# Patient Record
Sex: Female | Born: 1990
Health system: Southern US, Community
[De-identification: ages and names within clinical notes are randomized; demographics above are authoritative.]

## PROBLEM LIST (undated history)

## (undated) DIAGNOSIS — Z3201 Encounter for pregnancy test, result positive: Secondary | ICD-10-CM

## (undated) DIAGNOSIS — N946 Dysmenorrhea, unspecified: Secondary | ICD-10-CM

## (undated) DIAGNOSIS — N760 Acute vaginitis: Secondary | ICD-10-CM

## (undated) DIAGNOSIS — Z8619 Personal history of other infectious and parasitic diseases: Secondary | ICD-10-CM

## (undated) DIAGNOSIS — L309 Dermatitis, unspecified: Secondary | ICD-10-CM

## (undated) DIAGNOSIS — I10 Essential (primary) hypertension: Secondary | ICD-10-CM

## (undated) DIAGNOSIS — B9689 Other specified bacterial agents as the cause of diseases classified elsewhere: Secondary | ICD-10-CM

## (undated) DIAGNOSIS — B009 Herpesviral infection, unspecified: Secondary | ICD-10-CM

## (undated) DIAGNOSIS — R87629 Unspecified abnormal cytological findings in specimens from vagina: Secondary | ICD-10-CM

## (undated) DIAGNOSIS — N83209 Unspecified ovarian cyst, unspecified side: Secondary | ICD-10-CM

## (undated) DIAGNOSIS — T7412XA Child physical abuse, confirmed, initial encounter: Secondary | ICD-10-CM

## (undated) DIAGNOSIS — A630 Anogenital (venereal) warts: Secondary | ICD-10-CM

## (undated) HISTORY — DX: Unspecified ovarian cyst, unspecified side: N83.209

## (undated) HISTORY — DX: Child physical abuse, confirmed, initial encounter: T74.12XA

## (undated) HISTORY — PX: WISDOM TOOTH EXTRACTION: SHX21

## (undated) HISTORY — DX: Unspecified abnormal cytological findings in specimens from vagina: R87.629

## (undated) HISTORY — DX: Essential (primary) hypertension: I10

## (undated) HISTORY — DX: Other specified bacterial agents as the cause of diseases classified elsewhere: B96.89

## (undated) HISTORY — DX: Other specified bacterial agents as the cause of diseases classified elsewhere: N76.0

## (undated) HISTORY — PX: BREAST BIOPSY: SHX20

## (undated) HISTORY — PX: NO PAST SURGERIES: SHX2092

## (undated) HISTORY — DX: Anogenital (venereal) warts: A63.0

## (undated) HISTORY — DX: Personal history of other infectious and parasitic diseases: Z86.19

## (undated) HISTORY — DX: Dermatitis, unspecified: L30.9

---
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
## 2009-03-25 ENCOUNTER — Emergency Department (HOSPITAL_COMMUNITY): Admission: EM | Admit: 2009-03-25 | Discharge: 2009-03-25 | Payer: Self-pay | Admitting: Emergency Medicine

## 2009-07-27 ENCOUNTER — Emergency Department (HOSPITAL_COMMUNITY): Admission: EM | Admit: 2009-07-27 | Discharge: 2009-07-27 | Payer: Self-pay | Admitting: Emergency Medicine

## 2009-08-21 ENCOUNTER — Emergency Department (HOSPITAL_COMMUNITY): Admission: EM | Admit: 2009-08-21 | Discharge: 2009-08-22 | Payer: Self-pay | Admitting: Emergency Medicine

## 2009-10-12 ENCOUNTER — Emergency Department (HOSPITAL_COMMUNITY): Admission: EM | Admit: 2009-10-12 | Discharge: 2009-10-12 | Payer: Self-pay | Admitting: Emergency Medicine

## 2009-12-29 ENCOUNTER — Emergency Department (HOSPITAL_COMMUNITY): Admission: EM | Admit: 2009-12-29 | Discharge: 2009-12-29 | Payer: Self-pay | Admitting: Emergency Medicine

## 2010-07-29 LAB — URINALYSIS, ROUTINE W REFLEX MICROSCOPIC
Bilirubin Urine: NEGATIVE
Glucose, UA: NEGATIVE mg/dL
Hgb urine dipstick: NEGATIVE
Protein, ur: NEGATIVE mg/dL
Urobilinogen, UA: 0.2 mg/dL (ref 0.0–1.0)
pH: 6 (ref 5.0–8.0)

## 2010-07-29 LAB — GC/CHLAMYDIA PROBE AMP, GENITAL: GC Probe Amp, Genital: POSITIVE — AB

## 2010-07-29 LAB — WET PREP, GENITAL: Yeast Wet Prep HPF POC: NONE SEEN

## 2010-07-29 LAB — PREGNANCY, URINE: Preg Test, Ur: NEGATIVE

## 2010-08-02 LAB — URINALYSIS, ROUTINE W REFLEX MICROSCOPIC
Hgb urine dipstick: NEGATIVE
Specific Gravity, Urine: 1.025 (ref 1.005–1.030)
pH: 6 (ref 5.0–8.0)

## 2010-08-02 LAB — URINE MICROSCOPIC-ADD ON

## 2010-08-02 LAB — POCT PREGNANCY, URINE: Preg Test, Ur: NEGATIVE

## 2010-08-06 LAB — CBC
HCT: 38 % (ref 36.0–46.0)
Hemoglobin: 13.1 g/dL (ref 12.0–15.0)
MCHC: 34.6 g/dL (ref 30.0–36.0)
MCV: 87.2 fL (ref 78.0–100.0)
Platelets: 284 10*3/uL (ref 150–400)
RBC: 4.36 MIL/uL (ref 3.87–5.11)
RDW: 13.3 % (ref 11.5–15.5)
WBC: 6.1 10*3/uL (ref 4.0–10.5)

## 2010-08-06 LAB — DIFFERENTIAL
Basophils Absolute: 0 10*3/uL (ref 0.0–0.1)
Basophils Relative: 1 % (ref 0–1)

## 2010-08-06 LAB — URINALYSIS, ROUTINE W REFLEX MICROSCOPIC
Ketones, ur: NEGATIVE mg/dL
Nitrite: NEGATIVE
Protein, ur: NEGATIVE mg/dL

## 2010-08-06 LAB — HEPATIC FUNCTION PANEL
ALT: 11 U/L (ref 0–35)
Albumin: 3.7 g/dL (ref 3.5–5.2)
Alkaline Phosphatase: 65 U/L (ref 39–117)
Total Bilirubin: 0.7 mg/dL (ref 0.3–1.2)

## 2010-08-06 LAB — BASIC METABOLIC PANEL
BUN: 8 mg/dL (ref 6–23)
CO2: 25 mEq/L (ref 19–32)
Calcium: 9.2 mg/dL (ref 8.4–10.5)
GFR calc Af Amer: >60 mL/min (ref >60)
Sodium: 138 mEq/L (ref 135–145)

## 2010-08-06 LAB — PREGNANCY, URINE: Preg Test, Ur: NEGATIVE

## 2010-08-18 LAB — HEPATIC FUNCTION PANEL
Alkaline Phosphatase: 78 U/L (ref 39–117)
Bilirubin, Direct: 0.3 mg/dL (ref 0.0–0.3)
Indirect Bilirubin: 0.6 mg/dL (ref 0.3–0.9)
Total Bilirubin: 0.9 mg/dL (ref 0.3–1.2)

## 2010-08-18 LAB — BASIC METABOLIC PANEL
BUN: 11 mg/dL (ref 6–23)
CO2: 25 mEq/L (ref 19–32)
Calcium: 10.2 mg/dL (ref 8.4–10.5)
Chloride: 104 mEq/L (ref 96–112)
GFR calc Af Amer: >60 mL/min (ref >60)
GFR calc non Af Amer: >60 mL/min (ref >60)
Glucose, Bld: 124 mg/dL — ABNORMAL HIGH (ref 70–99)

## 2010-08-18 LAB — DIFFERENTIAL
Basophils Relative: 0 % (ref 0–1)
Eosinophils Relative: 0 % (ref 0–5)
Monocytes Absolute: 0.6 10*3/uL (ref 0.1–1.0)
Monocytes Relative: 3 % (ref 3–12)
Neutro Abs: 18.3 10*3/uL — ABNORMAL HIGH (ref 1.7–7.7)

## 2010-08-18 LAB — PREGNANCY, URINE: Preg Test, Ur: NEGATIVE

## 2010-08-18 LAB — URINALYSIS, ROUTINE W REFLEX MICROSCOPIC
Nitrite: NEGATIVE
Specific Gravity, Urine: 1.015 (ref 1.005–1.030)
Urobilinogen, UA: 1 mg/dL (ref 0.0–1.0)
pH: 7 (ref 5.0–8.0)

## 2010-08-18 LAB — LIPASE, BLOOD: Lipase: 12 U/L (ref 11–59)

## 2010-08-18 LAB — URINE MICROSCOPIC-ADD ON

## 2010-08-18 LAB — GC/CHLAMYDIA PROBE AMP, GENITAL: GC Probe Amp, Genital: POSITIVE — AB

## 2010-08-18 LAB — CBC
Hemoglobin: 14.3 g/dL (ref 12.0–15.0)
RBC: 4.77 MIL/uL (ref 3.87–5.11)
RDW: 13.2 % (ref 11.5–15.5)

## 2010-08-21 LAB — URINALYSIS, ROUTINE W REFLEX MICROSCOPIC
Ketones, ur: NEGATIVE mg/dL
Nitrite: NEGATIVE
Specific Gravity, Urine: 1.025 (ref 1.005–1.030)
pH: 7.5 (ref 5.0–8.0)

## 2010-08-21 LAB — COMPREHENSIVE METABOLIC PANEL
ALT: 17 U/L (ref 0–35)
Albumin: 4.1 g/dL (ref 3.5–5.2)
Alkaline Phosphatase: 62 U/L (ref 39–117)
Chloride: 111 mEq/L (ref 96–112)
Glucose, Bld: 95 mg/dL (ref 70–99)
Potassium: 3.8 mEq/L (ref 3.5–5.1)
Sodium: 144 mEq/L (ref 135–145)
Total Protein: 6.8 g/dL (ref 6.0–8.3)

## 2010-08-21 LAB — CBC
Hemoglobin: 13.4 g/dL (ref 12.0–15.0)
RBC: 4.31 MIL/uL (ref 3.87–5.11)
RDW: 12.6 % (ref 11.5–15.5)
WBC: 7.4 10*3/uL (ref 4.0–10.5)

## 2010-08-21 LAB — DIFFERENTIAL
Basophils Relative: 0 % (ref 0–1)
Eosinophils Absolute: 0 10*3/uL (ref 0.0–0.7)
Monocytes Absolute: 0.2 10*3/uL (ref 0.1–1.0)
Monocytes Relative: 2 % — ABNORMAL LOW (ref 3–12)
Neutrophils Relative %: 78 % — ABNORMAL HIGH (ref 43–77)

## 2010-08-21 LAB — PREGNANCY, URINE: Preg Test, Ur: NEGATIVE

## 2010-08-25 LAB — WET PREP, GENITAL: Trich, Wet Prep: NONE SEEN

## 2010-08-30 LAB — DIFFERENTIAL
Basophils Absolute: 0 10*3/uL (ref 0.0–0.1)
Lymphocytes Relative: 53 % — ABNORMAL HIGH (ref 12–46)
Monocytes Absolute: 0.5 10*3/uL (ref 0.1–1.0)
Monocytes Relative: 6 % (ref 3–12)
Neutro Abs: 3 10*3/uL (ref 1.7–7.7)

## 2010-08-30 LAB — BASIC METABOLIC PANEL
Calcium: 9.7 mg/dL (ref 8.4–10.5)
GFR calc Af Amer: >60 mL/min (ref >60)
GFR calc non Af Amer: >60 mL/min (ref >60)
Sodium: 137 mEq/L (ref 135–145)

## 2010-08-30 LAB — CBC
Hemoglobin: 13.6 g/dL (ref 12.0–15.0)
RBC: 4.64 MIL/uL (ref 3.87–5.11)

## 2010-09-24 ENCOUNTER — Emergency Department (HOSPITAL_COMMUNITY)
Admission: EM | Admit: 2010-09-24 | Discharge: 2010-09-24 | Disposition: A | Discharge status: Home / Self Care | Payer: Medicaid Other | Attending: Emergency Medicine | Admitting: Emergency Medicine

## 2010-09-24 ENCOUNTER — Emergency Department (HOSPITAL_COMMUNITY): Payer: Medicaid Other

## 2010-09-24 DIAGNOSIS — N898 Other specified noninflammatory disorders of vagina: Secondary | ICD-10-CM | POA: Insufficient documentation

## 2010-09-24 LAB — URINALYSIS, ROUTINE W REFLEX MICROSCOPIC
Bilirubin Urine: NEGATIVE
Ketones, ur: NEGATIVE mg/dL
Nitrite: NEGATIVE
Specific Gravity, Urine: >1.03 — ABNORMAL HIGH (ref 1.005–1.030)
Urobilinogen, UA: 0.2 mg/dL (ref 0.0–1.0)

## 2010-09-24 LAB — DIFFERENTIAL
Basophils Absolute: 0 10*3/uL (ref 0.0–0.1)
Lymphocytes Relative: 34 % (ref 12–46)
Monocytes Absolute: 0.4 10*3/uL (ref 0.1–1.0)
Monocytes Relative: 8 % (ref 3–12)
Neutro Abs: 2.8 10*3/uL (ref 1.7–7.7)

## 2010-09-24 LAB — CBC
HCT: 39.1 % (ref 36.0–46.0)
Hemoglobin: 12.8 g/dL (ref 12.0–15.0)
MCHC: 32.7 g/dL (ref 30.0–36.0)
RBC: 4.51 MIL/uL (ref 3.87–5.11)

## 2010-09-24 LAB — PREGNANCY, URINE: Preg Test, Ur: NEGATIVE

## 2010-09-24 LAB — WET PREP, GENITAL
Clue Cells Wet Prep HPF POC: NONE SEEN
Trich, Wet Prep: NONE SEEN

## 2010-09-24 LAB — URINE MICROSCOPIC-ADD ON

## 2010-09-27 LAB — GC/CHLAMYDIA PROBE AMP, GENITAL: Chlamydia, DNA Probe: NEGATIVE

## 2011-02-14 LAB — COMPREHENSIVE METABOLIC PANEL
ALT: 18
AST: 31
Alkaline Phosphatase: 68
CO2: 23
Calcium: 10.6 — ABNORMAL HIGH
Chloride: 108
GFR calc Af Amer: >60
GFR calc non Af Amer: >60
Glucose, Bld: 110 — ABNORMAL HIGH
Potassium: 4.1
Sodium: 142

## 2011-02-14 LAB — CBC
Hemoglobin: 15.5 — ABNORMAL HIGH
MCHC: 33.6
RBC: 5.23 — ABNORMAL HIGH
WBC: 12.6 — ABNORMAL HIGH

## 2011-02-14 LAB — PREGNANCY, URINE: Preg Test, Ur: NEGATIVE

## 2011-02-14 LAB — DIFFERENTIAL
Basophils Relative: 0
Eosinophils Absolute: 0
Eosinophils Relative: 0
Lymphs Abs: 1.6

## 2011-02-14 LAB — LIPASE, BLOOD: Lipase: 15

## 2011-02-14 LAB — SAMPLE TO BLOOD BANK

## 2011-02-14 LAB — AMYLASE: Amylase: 112

## 2011-02-28 LAB — URINALYSIS, ROUTINE W REFLEX MICROSCOPIC
Leukocytes, UA: NEGATIVE
Nitrite: NEGATIVE
Protein, ur: 30 — AB
pH: 6

## 2011-02-28 LAB — STREP A DNA PROBE: Group A Strep Probe: NEGATIVE

## 2011-02-28 LAB — PREGNANCY, URINE: Preg Test, Ur: NEGATIVE

## 2011-05-17 NOTE — L&D Delivery Note (Signed)
Delivery Note At 3:47 AM a viable female was delivered via Vaginal, Kiwi Vacuum (Extractor) (Presentation: Right Occiput Anterior).  APGAR: 4, 8; weight pending.   Placenta status: Intact, Spontaneous.  Cord: 3 vessels with the following complications: None.  Cord pH: pending Dr. Shawnie Pons present for delivery and delivered infant with vacuum extractor, as above. Placenta delivered by Dr. Casper Harrison.  Anesthesia: Epidural  Episiotomy: None Lacerations: right labial Suture Repair: 3.0 Vicryl Est. Blood Loss (mL): 300  Mom to postpartum.  Baby to nursery-stable.  [name], Rebecca [name], 4:06 AM  I was called to the room with terminal bradycardia after rapid descent.  There was some minimal bleeding noted.  Head was noted to be ROA and +4.  Good maternal effort but FHR in the 90's x 10 mins on my arrival. I applied vacuum and pulled x 1 with rapid pop-off.  Re-applied vacuum 2 contractions later when FHR was improved but very smooth line.  Pulled x 2 and delivery of the head accomplished.  Nuchal cord x 1 reduced easily.  Shoulders delivered easily.  Infant stunned at birth and cord clamped x 2 and cut and given to waiting Peds.  There was clot noted on placenta. [name],[name] [name] 4:15 AM

## 2011-07-05 ENCOUNTER — Emergency Department (HOSPITAL_COMMUNITY)
Admission: EM | Admit: 2011-07-05 | Discharge: 2011-07-05 | Disposition: A | Discharge status: Home / Self Care | Payer: Medicaid Other | Attending: Emergency Medicine | Admitting: Emergency Medicine

## 2011-07-05 ENCOUNTER — Encounter (HOSPITAL_COMMUNITY): Payer: Self-pay | Admitting: *Deleted

## 2011-07-05 DIAGNOSIS — K529 Noninfective gastroenteritis and colitis, unspecified: Secondary | ICD-10-CM

## 2011-07-05 DIAGNOSIS — G43909 Migraine, unspecified, not intractable, without status migrainosus: Secondary | ICD-10-CM | POA: Insufficient documentation

## 2011-07-05 DIAGNOSIS — K5289 Other specified noninfective gastroenteritis and colitis: Secondary | ICD-10-CM | POA: Insufficient documentation

## 2011-07-05 DIAGNOSIS — R000 Tachycardia, unspecified: Secondary | ICD-10-CM | POA: Insufficient documentation

## 2011-07-05 LAB — COMPREHENSIVE METABOLIC PANEL
ALT: 16 U/L (ref 0–35)
Alkaline Phosphatase: 85 U/L (ref 39–117)
CO2: 23 mEq/L (ref 19–32)
Calcium: 10.6 mg/dL — ABNORMAL HIGH (ref 8.4–10.5)
Chloride: 106 mEq/L (ref 96–112)
GFR calc Af Amer: >90 mL/min (ref >90)
GFR calc non Af Amer: >90 mL/min (ref >90)
Glucose, Bld: 120 mg/dL — ABNORMAL HIGH (ref 70–99)
Potassium: 4.9 mEq/L (ref 3.5–5.1)
Sodium: 140 mEq/L (ref 135–145)
Total Bilirubin: 0.5 mg/dL (ref 0.3–1.2)

## 2011-07-05 LAB — CBC
MCV: 86.2 fL (ref 78.0–100.0)
Platelets: 331 10*3/uL (ref 150–400)
RBC: 5.08 MIL/uL (ref 3.87–5.11)
WBC: 13.8 10*3/uL — ABNORMAL HIGH (ref 4.0–10.5)

## 2011-07-05 LAB — DIFFERENTIAL
Eosinophils Relative: 0 % (ref 0–5)
Lymphocytes Relative: 6 % — ABNORMAL LOW (ref 12–46)
Lymphs Abs: 0.8 10*3/uL (ref 0.7–4.0)
Neutro Abs: 12.3 10*3/uL — ABNORMAL HIGH (ref 1.7–7.7)

## 2011-07-05 LAB — URINALYSIS, ROUTINE W REFLEX MICROSCOPIC
Bilirubin Urine: NEGATIVE
Glucose, UA: NEGATIVE mg/dL
Hgb urine dipstick: NEGATIVE
Protein, ur: NEGATIVE mg/dL
Urobilinogen, UA: 0.2 mg/dL (ref 0.0–1.0)

## 2011-07-05 MED ORDER — SODIUM CHLORIDE 0.9 % IV BOLUS (SEPSIS)
1000.0000 mL | Freq: Once | INTRAVENOUS | Status: AC
  Administered 2011-07-05: 1000 mL via INTRAVENOUS

## 2011-07-05 MED ORDER — ONDANSETRON HCL 4 MG PO TABS: 4.0000 mg | ORAL_TABLET | Freq: Four times a day (QID) | ORAL | Status: DC

## 2011-07-05 MED ORDER — ONDANSETRON HCL 4 MG/2ML IJ SOLN
4.0000 mg | Freq: Once | INTRAMUSCULAR | Status: AC
  Administered 2011-07-05: 4 mg via INTRAVENOUS
  Filled 2011-07-05: qty 2

## 2011-07-05 MED ORDER — ONDANSETRON HCL 4 MG PO TABS: 4.0000 mg | ORAL_TABLET | Freq: Four times a day (QID) | ORAL | Status: AC

## 2011-07-05 MED ORDER — PANTOPRAZOLE SODIUM 40 MG IV SOLR
40.0000 mg | Freq: Once | INTRAVENOUS | Status: AC
  Administered 2011-07-05: 40 mg via INTRAVENOUS
  Filled 2011-07-05: qty 40

## 2011-07-05 NOTE — ED Notes (Signed)
Pt states is feeling better. Pt is alert, and talking. No emesis at this time.

## 2011-07-05 NOTE — ED Notes (Signed)
Vomiting, diarrhea, since 5 am.

## 2011-07-05 NOTE — ED Notes (Signed)
Pt states had a migraine last night, and awoke this morning with emesis and diarrhea. Pt is vomiting large amounts of yellow bile, pt denies pain at this time. PIV started without difficulty

## 2011-07-05 NOTE — ED Provider Notes (Signed)
History   Scribed for Loren Racer, MD, the patient was seen in room APA11/APA11 . This chart was scribed by Lewanda Rife.   CSN: [id number]  Arrival date & time 07/05/11  1534   First MD Initiated Contact with Patient 07/05/11 1544      Chief Complaint  Patient presents with  . Emesis    (Consider location/radiation/quality/duration/timing/severity/associated sxs/prior treatment) HPI Comments: Pt states episodes of vomiting and diarrhea suddenly initiated after eating salmon cakes at 5 am this morning. Pt reports she has not been in contact with anyone ill recently.   Patient is a 21 y.o. female presenting with vomiting. The history is provided by the patient.  Emesis  This is a new problem. The current episode started 12 to 24 hours ago. The problem occurs continuously. The problem has been gradually worsening. The emesis has an appearance of stomach contents. There has been no fever. Associated symptoms include abdominal pain (epigastric) and diarrhea. Pertinent negatives include no cough, no fever and no headaches. Risk factors include suspect food intake.    Past Medical History  Diagnosis Date  . Migraine     History reviewed. No pertinent past surgical history.  Family History  Problem Relation Age of Onset  . Diabetes Mother   . Diabetes Father     History  Substance Use Topics  . Smoking status: Never Smoker   . Smokeless tobacco: Not on file  . Alcohol Use: No    OB History    Grav Para Term Preterm Abortions TAB SAB Ect Mult Living                  Review of Systems  Constitutional: Negative for fever and fatigue.  HENT: Negative for congestion, sinus pressure and ear discharge.   Eyes: Negative for discharge.  Respiratory: Negative for cough.   Cardiovascular: Negative for chest pain.  Gastrointestinal: Positive for nausea, vomiting, abdominal pain (epigastric) and diarrhea.  Genitourinary: Negative for frequency and hematuria.    Musculoskeletal: Negative for back pain.  Skin: Negative for rash.  Neurological: Negative for seizures and headaches.  Hematological: Negative.   Psychiatric/Behavioral: Negative for hallucinations.  All other systems reviewed and are negative.    Allergies  Benadryl and Nyquil  Home Medications   Current Outpatient Rx  Name Route Sig Dispense Refill  . IBUPROFEN 200 MG PO TABS Oral Take 400 mg by mouth as needed. For migraines    . ONDANSETRON HCL 4 MG PO TABS Oral Take 1 tablet (4 mg total) by mouth every 6 (six) hours. 12 tablet 0    BP 123/100  Pulse 90  Temp(Src) 98 F (36.7 C) (Oral)  Resp 20  Ht 5\' 3"  (1.6 m)  Wt 118 lb (53.524 kg)  BMI 20.90 kg/m2  SpO2 100%  LMP 06/17/2011  Physical Exam  Nursing note and vitals reviewed. Constitutional: She is oriented to person, place, and time. She appears well-developed.  HENT:  Head: Normocephalic and atraumatic.  Mouth/Throat: Oropharynx is clear and moist.  Eyes: Conjunctivae and EOM are normal. No scleral icterus.  Neck: Neck supple. No thyromegaly present.  Cardiovascular: Regular rhythm.  Tachycardia present.  Exam reveals no gallop and no friction rub.   No murmur heard. Pulmonary/Chest: Effort normal. No stridor. She has no wheezes. She has no rales. She exhibits no tenderness.  Abdominal: Soft. She exhibits no distension. There is tenderness (Mild epigastric tenderness  ). There is no rebound and no guarding.  Musculoskeletal: Normal range of motion.  She exhibits no edema.  Lymphadenopathy:    She has no cervical adenopathy.  Neurological: She is oriented to person, place, and time. Coordination normal.  Skin: Skin is warm and dry. No rash noted. No erythema.  Psychiatric: She has a normal mood and affect. Her behavior is normal.    ED Course  Procedures (including critical care time)  Labs Reviewed  CBC - Abnormal; Notable for the following:    WBC 13.8 (*)    All other components within normal limits   DIFFERENTIAL - Abnormal; Notable for the following:    Neutrophils Relative 89 (*)    Neutro Abs 12.3 (*)    Lymphocytes Relative 6 (*)    All other components within normal limits  COMPREHENSIVE METABOLIC PANEL - Abnormal; Notable for the following:    Glucose, Bld 120 (*)    Calcium 10.6 (*)    All other components within normal limits  URINALYSIS, ROUTINE W REFLEX MICROSCOPIC - Abnormal; Notable for the following:    APPearance HAZY (*)    Specific Gravity, Urine >1.030 (*)    Ketones, ur TRACE (*)    All other components within normal limits  LIPASE, BLOOD  PREGNANCY, URINE   No results found.   1. Gastroenteritis       MDM    Pt states she is feeling better. No vomiting in ED.     I personally performed the services described in this documentation, which was scribed in my presence. The recorded information has been reviewed and considered.     Loren Racer, MD 07/05/11 [phone]

## 2011-07-05 NOTE — Discharge Instructions (Signed)
B.R.A.T. Diet Your doctor has recommended the B.R.A.T. diet for you or your child until the condition improves. This is often used to help control diarrhea and vomiting symptoms. If you or your child can tolerate clear liquids, you may have:  Bananas.   Rice.   Applesauce.   Toast (and other simple starches such as crackers, potatoes, noodles).  Be sure to avoid dairy products, meats, and fatty foods until symptoms are better. Fruit juices such as apple, grape, and prune juice can make diarrhea worse. Avoid these. Continue this diet for 2 days or as instructed by your caregiver. Document Released: 05/02/2005 Document Revised: 01/12/2011 Document Reviewed: 10/19/2006 Exit[employer] Patient Information 2012 [employer].  Viral Gastroenteritis Viral gastroenteritis is also known as stomach flu. This condition affects the stomach and intestinal tract. The illness typically lasts 3 to 8 days. Most people develop an immune response. This eventually gets rid of the virus. While this natural response develops, the virus can make you quite ill.  CAUSES  Diarrhea and vomiting are often caused by a virus. Medicines (antibiotics) that kill germs will not help unless there is also a germ (bacterial) infection. SYMPTOMS  The most common symptom is diarrhea. This can cause severe loss of fluids (dehydration) and body salt (electrolyte) imbalance. TREATMENT  Treatments for this illness are aimed at rehydration. Antidiarrheal medicines are not recommended. They do not decrease diarrhea volume and may be harmful. Usually, home treatment is all that is needed. The most serious cases involve vomiting so severely that you are not able to keep down fluids taken by mouth (orally). In these cases, intravenous (IV) fluids are needed. Vomiting with viral gastroenteritis is common, but it will usually go away with treatment. HOME CARE INSTRUCTIONS  Small amounts of fluids should be taken frequently. Large amounts at one  time may not be tolerated. Plain water may be harmful in infants and the elderly. Oral rehydration solutions (ORS) are available at pharmacies and grocery stores. ORS replace water and important electrolytes in proper proportions. Sports drinks are not as effective as ORS and may be harmful due to sugars worsening diarrhea.  As a general guideline for children, replace any new fluid losses from diarrhea or vomiting with ORS as follows:   If your child weighs 22 pounds or under (10 kg or less), give 60-120 mL (1/4 - 1/2 cup or 2 - 4 ounces) of ORS for each diarrheal stool or vomiting episode.   If your child weighs more than 22 pounds (more than 10 kgs), give 120-240 mL (1/2 - 1 cup or 4 - 8 ounces) of ORS for each diarrheal stool or vomiting episode.   In a child with vomiting, it may be helpful to give the above ORS replacement in 5 mL (1 teaspoon) amounts every 5 minutes, then increase as tolerated.   While correcting for dehydration, children should eat normally. However, foods high in sugar should be avoided because this may worsen diarrhea. Large amounts of carbonated soft drinks, juice, gelatin desserts, and other highly sugared drinks should be avoided.   After correction of dehydration, other liquids that are appealing to the child may be added. Children should drink small amounts of fluids frequently and fluids should be increased as tolerated.   Adults should eat normally while drinking more fluids than usual. Drink small amounts of fluids frequently and increase as tolerated. Drink enough water and fluids to keep your urine clear or pale yellow. Broths, weak decaffeinated tea, lemon-lime soft drinks (allowed to   go flat), and ORS replace fluids and electrolytes.   Avoid:   Carbonated drinks.   Juice.   Extremely hot or cold fluids.   Caffeine drinks.   Fatty, greasy foods.   Alcohol.   Tobacco.   Too much intake of anything at one time.   Gelatin desserts.   Probiotics  are active cultures of beneficial bacteria. They may lessen the amount and number of diarrheal stools in adults. Probiotics can be found in yogurt with active cultures and in supplements.   Wash your hands well to avoid spreading bacteria and viruses.   Antidiarrheal medicines are not recommended for infants and children.   Only take over-the-counter or prescription medicines for pain, discomfort, or fever as directed by your caregiver. Do not give aspirin to children.   For adults with dehydration, ask your caregiver if you should continue all prescribed and over-the-counter medicines.   If your caregiver has given you a follow-up appointment, it is very important to keep that appointment. Not keeping the appointment could result in a lasting (chronic) or permanent injury and disability. If there is any problem keeping the appointment, you must call to reschedule.  SEEK IMMEDIATE MEDICAL CARE IF:   You are unable to keep fluids down.   There is no urine output in 6 to 8 hours or there is only a small amount of very dark urine.   You develop shortness of breath.   There is blood in the vomit (may look like coffee grounds) or stool.   Belly (abdominal) pain develops, increases, or localizes.   There is persistent vomiting or diarrhea.   You have a fever.   Your baby is older than 3 months with a rectal temperature of 102 F (38.9 C) or higher.   Your baby is 3 months old or younger with a rectal temperature of 100.4 F (38 C) or higher.  MAKE SURE YOU:   Understand these instructions.   Will watch your condition.   Will get help right away if you are not doing well or get worse.  Document Released: 05/02/2005 Document Revised: 01/12/2011 Document Reviewed: 09/13/2006 [employer] Patient Information 2012 [employer]. 

## 2011-09-05 ENCOUNTER — Encounter (HOSPITAL_COMMUNITY): Payer: Self-pay | Admitting: *Deleted

## 2011-09-05 ENCOUNTER — Emergency Department (HOSPITAL_COMMUNITY)
Admission: EM | Admit: 2011-09-05 | Discharge: 2011-09-05 | Disposition: A | Discharge status: Home / Self Care | Payer: Medicaid Other | Attending: Emergency Medicine | Admitting: Emergency Medicine

## 2011-09-05 DIAGNOSIS — O210 Mild hyperemesis gravidarum: Secondary | ICD-10-CM | POA: Insufficient documentation

## 2011-09-05 DIAGNOSIS — E876 Hypokalemia: Secondary | ICD-10-CM | POA: Insufficient documentation

## 2011-09-05 DIAGNOSIS — R42 Dizziness and giddiness: Secondary | ICD-10-CM | POA: Insufficient documentation

## 2011-09-05 DIAGNOSIS — O99891 Other specified diseases and conditions complicating pregnancy: Secondary | ICD-10-CM | POA: Insufficient documentation

## 2011-09-05 LAB — URINALYSIS, ROUTINE W REFLEX MICROSCOPIC
Bilirubin Urine: NEGATIVE
Glucose, UA: NEGATIVE mg/dL
Ketones, ur: >80 mg/dL — AB
Leukocytes, UA: NEGATIVE
Specific Gravity, Urine: 1.02 (ref 1.005–1.030)
pH: 8.5 — ABNORMAL HIGH (ref 5.0–8.0)

## 2011-09-05 LAB — BASIC METABOLIC PANEL
BUN: 8 mg/dL (ref 6–23)
Chloride: 99 mEq/L (ref 96–112)
GFR calc Af Amer: >90 mL/min (ref >90)
GFR calc non Af Amer: >90 mL/min (ref >90)
Glucose, Bld: 87 mg/dL (ref 70–99)
Potassium: 3.4 mEq/L — ABNORMAL LOW (ref 3.5–5.1)
Sodium: 135 mEq/L (ref 135–145)

## 2011-09-05 MED ORDER — ONDANSETRON HCL 4 MG/2ML IJ SOLN
4.0000 mg | Freq: Once | INTRAMUSCULAR | Status: AC
  Administered 2011-09-05: 4 mg via INTRAVENOUS
  Filled 2011-09-05: qty 2

## 2011-09-05 MED ORDER — ONDANSETRON 8 MG PO TBDP: 8.0000 mg | ORAL_TABLET | Freq: Three times a day (TID) | ORAL | Status: AC | PRN

## 2011-09-05 MED ORDER — SODIUM CHLORIDE 0.9 % IV BOLUS (SEPSIS)
1000.0000 mL | Freq: Once | INTRAVENOUS | Status: AC
  Administered 2011-09-05: 1000 mL via INTRAVENOUS

## 2011-09-05 NOTE — ED Notes (Addendum)
N/v, abd pain, headache, Positive preg test at home.

## 2011-09-05 NOTE — ED Provider Notes (Signed)
History     CSN: [id number]  Arrival date & time 09/05/11  1242   First MD Initiated Contact with Patient 09/05/11 1535      Chief Complaint  Patient presents with  . Emesis    (Consider location/radiation/quality/duration/timing/severity/associated sxs/prior treatment) Patient is a 21 y.o. female presenting with vomiting. The history is provided by the patient.  Emesis  Pertinent negatives include no abdominal pain, no diarrhea and no headaches.   patient has had nausea vomiting for last 3 weeks. She states she's got a headache because of it. No relief with her Zofran at home. He states it starts in the morning date. She states she took a home pregnancy test and it was positive. Her last period was the end of February beginning of March. She did not have her normal period beginning of a. Mild abdominal pain. Mild dizziness. No fevers. She states that throwing up food and bile. She has an appointment with Dr. Emelda Fear tomorrow  Past Medical History  Diagnosis Date  . Migraine     History reviewed. No pertinent past surgical history.  Family History  Problem Relation Age of Onset  . Diabetes Mother     History  Substance Use Topics  . Smoking status: Former Games developer  . Smokeless tobacco: Not on file  . Alcohol Use: No    OB History    Grav Para Term Preterm Abortions TAB SAB Ect Mult Living                  Review of Systems  Constitutional: Negative for activity change and appetite change.  HENT: Negative for neck stiffness.   Eyes: Negative for pain.  Respiratory: Negative for chest tightness and shortness of breath.   Cardiovascular: Negative for chest pain and leg swelling.  Gastrointestinal: Positive for nausea and vomiting. Negative for abdominal pain and diarrhea.  Genitourinary: Negative for flank pain and vaginal bleeding.  Musculoskeletal: Negative for back pain.  Skin: Negative for rash.  Neurological: Positive for dizziness. Negative for weakness,  numbness and headaches.  Psychiatric/Behavioral: Negative for behavioral problems.    Allergies  Benadryl and Nyquil  Home Medications   Current Outpatient Rx  Name Route Sig Dispense Refill  . IBUPROFEN 200 MG PO TABS Oral Take 600-800 mg by mouth as needed. For migraines    . ONDANSETRON HCL 4 MG PO TABS Oral Take 4 mg by mouth once as needed. For nausea and vomiting    . ONDANSETRON 8 MG PO TBDP Oral Take 1 tablet (8 mg total) by mouth every 8 (eight) hours as needed for nausea. 20 tablet 0    BP 109/56  Pulse 74  Temp(Src) 97.8 F (36.6 C) (Oral)  Resp 18  Ht 5\' 3"  (1.6 m)  Wt 106 lb (48.081 kg)  BMI 18.78 kg/m2  SpO2 100%  LMP 07/14/2011  Physical Exam  Nursing note and vitals reviewed. Constitutional: She is oriented to person, place, and time. She appears well-developed and well-nourished.  HENT:  Head: Normocephalic and atraumatic.  Eyes: EOM are normal. Pupils are equal, round, and reactive to light.  Neck: Normal range of motion. Neck supple.  Cardiovascular: Normal rate, regular rhythm and normal heart sounds.   No murmur heard. Pulmonary/Chest: Effort normal and breath sounds normal. No respiratory distress. She has no wheezes. She has no rales.  Abdominal: Soft. Bowel sounds are normal. She exhibits no distension. There is no tenderness. There is no rebound and no guarding.  Musculoskeletal: Normal range of  motion.  Neurological: She is alert and oriented to person, place, and time. No cranial nerve deficit.  Skin: Skin is warm and dry.  Psychiatric: She has a normal mood and affect. Her speech is normal.    ED Course  Procedures (including critical care time)  Labs Reviewed  URINALYSIS, ROUTINE W REFLEX MICROSCOPIC - Abnormal; Notable for the following:    Color, Urine AMBER (*) BIOCHEMICALS MAY BE AFFECTED BY COLOR   APPearance CLOUDY (*)    pH 8.5 (*)    Ketones, ur >80 (*)    All other components within normal limits  PREGNANCY, URINE - Abnormal;  Notable for the following:    Preg Test, Ur POSITIVE (*)    All other components within normal limits  BASIC METABOLIC PANEL - Abnormal; Notable for the following:    Potassium 3.4 (*)    All other components within normal limits   No results found.   1. Hyperemesis gravidarum       MDM  Patient with positive pregnancy test and nausea vomiting. Last period was beginning of last month. She has mild hypokalemia here. She has greater than 80 ketones the urine. Patient feels much better after 2 L of IV fluids. She is tolerated orals. She has followup with an OB/GYN in the morning.        Juliet Rude. Rubin Payor, MD 09/05/11 [phone]

## 2011-09-05 NOTE — Discharge Instructions (Signed)
Hyperemesis Gravidarum  Hyperemesis gravidarum is a severe form of nausea and vomiting that happens during pregnancy. Hyperemesis is worse than morning sickness. It may cause a woman to have nausea or vomiting all day for many days. It may keep a woman from eating and drinking enough food and liquids. Hyperemesis usually occurs during the first half (the first 20 weeks) of pregnancy. It often goes away once a woman is in her second half of pregnancy. However, sometimes hyperemesis continues through an entire pregnancy.   CAUSES   The cause of this condition is not completely known but is thought to be due to changes in the body's hormones when pregnant. It could be the high level of the pregnancy hormone or an increase in estrogen in the body.   SYMPTOMS    Severe nausea and vomiting.   Nausea that does not go away.   Vomiting that does not allow you to keep any food down.   Weight loss and body fluid loss (dehydration).   Having no desire to eat or not liking food you have previously enjoyed.  DIAGNOSIS   Your caregiver may ask you about your symptoms. Your caregiver may also order blood tests and urine tests to make sure something else is not causing the problem.   TREATMENT   You may only need medicine to control the problem. If medicines do not control the nausea and vomiting, you will be treated in the hospital to prevent dehydration, acidosis, weight loss, and changes in the electrolytes in your body that may harm the unborn baby (fetus). You may need intravenous (IV) fluids.   HOME CARE INSTRUCTIONS    Take all medicine as directed by your caregiver.   Try eating a couple of dry crackers or toast in the morning before getting out of bed.   Avoid foods and smells that upset your stomach.   Avoid fatty and spicy foods. Eat 5 to 6 small meals a day.   Do not drink when eating meals. Drink between meals.   For snacks, eat high protein foods, such as cheese. Eat or suck on things that have ginger in  them. Ginger helps nausea.   Avoid food preparation. The smell of food can spoil your appetite.   Avoid iron pills and iron in your multivitamins until after 3 to 4 months of being pregnant.  SEEK MEDICAL CARE IF:    Your abdominal pain increases since the last time you saw your caregiver.   You have a severe headache.   You develop vision problems.   You feel you are losing weight.  SEEK IMMEDIATE MEDICAL CARE IF:    You are unable to keep fluids down.   You vomit blood.   You have constant nausea and vomiting.   You have a fever.   You have excessive weakness, dizziness, fainting, or extreme thirst.  MAKE SURE YOU:    Understand these instructions.   Will watch your condition.   Will get help right away if you are not doing well or get worse.  Document Released: 05/02/2005 Document Revised: 04/21/2011 Document Reviewed: 08/02/2010  [employer] Patient Information 2012 [employer].

## 2011-09-09 ENCOUNTER — Emergency Department (HOSPITAL_COMMUNITY)
Admission: EM | Admit: 2011-09-09 | Discharge: 2011-09-09 | Disposition: A | Discharge status: Home / Self Care | Payer: Medicaid Other | Attending: Emergency Medicine | Admitting: Emergency Medicine

## 2011-09-09 ENCOUNTER — Encounter (HOSPITAL_COMMUNITY): Payer: Self-pay | Admitting: *Deleted

## 2011-09-09 DIAGNOSIS — R109 Unspecified abdominal pain: Secondary | ICD-10-CM | POA: Insufficient documentation

## 2011-09-09 DIAGNOSIS — O210 Mild hyperemesis gravidarum: Secondary | ICD-10-CM | POA: Insufficient documentation

## 2011-09-09 HISTORY — DX: Encounter for pregnancy test, result positive: Z32.01

## 2011-09-09 LAB — URINALYSIS, ROUTINE W REFLEX MICROSCOPIC
Nitrite: NEGATIVE
Specific Gravity, Urine: 1.025 (ref 1.005–1.030)
Urobilinogen, UA: 1 mg/dL (ref 0.0–1.0)
pH: 6.5 (ref 5.0–8.0)

## 2011-09-09 LAB — POCT I-STAT, CHEM 8
Creatinine, Ser: 0.7 mg/dL (ref 0.50–1.10)
HCT: 41 % (ref 36.0–46.0)
Hemoglobin: 13.9 g/dL (ref 12.0–15.0)
Potassium: 3.6 mEq/L (ref 3.5–5.1)
Sodium: 139 mEq/L (ref 135–145)
TCO2: 21 mmol/L (ref 0–100)

## 2011-09-09 LAB — PREGNANCY, URINE: Preg Test, Ur: POSITIVE — AB

## 2011-09-09 MED ORDER — ONDANSETRON HCL 4 MG/2ML IJ SOLN
4.0000 mg | Freq: Once | INTRAMUSCULAR | Status: AC
  Administered 2011-09-09: 4 mg via INTRAVENOUS
  Filled 2011-09-09: qty 2

## 2011-09-09 MED ORDER — PROMETHAZINE HCL 25 MG PO TABS: 25.0000 mg | ORAL_TABLET | Freq: Four times a day (QID) | ORAL | Status: DC | PRN

## 2011-09-09 MED ORDER — SODIUM CHLORIDE 0.9 % IV BOLUS (SEPSIS)
1000.0000 mL | Freq: Once | INTRAVENOUS | Status: AC
  Administered 2011-09-09: 1000 mL via INTRAVENOUS

## 2011-09-09 NOTE — ED Provider Notes (Signed)
History    This chart was scribed for Rebecca Gaskins, MD, MD by Smitty Pluck. The patient was seen in room APA12 and the patient's care was started at 1:50PM.   CSN: [id number]  Arrival date & time 09/09/11  1104   First MD Initiated Contact with Patient 09/09/11 1327      Chief Complaint  Patient presents with  . Morning Sickness  . Abdominal Pain    Patient is a 21 y.o. female presenting with abdominal pain. The history is provided by the patient.  Abdominal Pain The primary symptoms of the illness include abdominal pain.   Rebecca [name] is a 21 y.o. female who presents to the Emergency Department complaining of persistent moderate emesis and nausea onset several weeks ago. She has taken Zofran without relief. Pt reports that she is unable to see OB GYN until she gives confirmation of pregnancy. Pt reports her last regular period was from 07-14-11 and ended 07-19-11.  She is likely [redacted] weeks pregnant. Pt is gravida 1, para 0. She denies vaginal bleeding and discharge. She has intermittent lower abdominal pain. Vomiting aggravates the abdominal pain. Emesis has been constant. There is no radiation.  No back pain No syncope   Past Medical History  Diagnosis Date  . Migraine   . Pregnancy test-positive     History reviewed. No pertinent past surgical history.  Family History  Problem Relation Age of Onset  . Diabetes Mother     History  Substance Use Topics  . Smoking status: Former Games developer  . Smokeless tobacco: Not on file  . Alcohol Use: No    OB History    Grav Para Term Preterm Abortions TAB SAB Ect Mult Living                  Review of Systems  Gastrointestinal: Positive for abdominal pain.  All other systems reviewed and are negative.   10 Systems reviewed and all are negative for acute change except as noted in the HPI.   Allergies  Benadryl and Nyquil  Home Medications   Current Outpatient Rx  Name Route Sig Dispense Refill  . IBUPROFEN 200 MG  PO TABS Oral Take 600-800 mg by mouth as needed. For migraines    . ONDANSETRON HCL 4 MG PO TABS Oral Take 4 mg by mouth once as needed. For nausea and vomiting    . ONDANSETRON 8 MG PO TBDP Oral Take 1 tablet (8 mg total) by mouth every 8 (eight) hours as needed for nausea. 20 tablet 0    BP 123/72  Pulse 87  Temp(Src) 97.8 F (36.6 C) (Oral)  Resp 22  Ht 5\' 3"  (1.6 m)  Wt 106 lb (48.081 kg)  BMI 18.78 kg/m2  SpO2 98%  LMP 07/14/2011  Physical Exam  Nursing note and vitals reviewed.  CONSTITUTIONAL: Well developed/well nourished HEAD AND FACE: Normocephalic/atraumatic EYES: EOMI/PERRL, no icterus ENMT: Mucous membranes dry NECK: supple no meningeal signs SPINE:entire spine nontender CV: S1/S2 noted, no murmurs/rubs/gallops noted LUNGS: Lungs are clear to auscultation bilaterally, no apparent distress ABDOMEN: soft, nontender, no rebound or guarding, +BS GU:no cva tenderness Pelvic deferred NEURO: Pt is awake/alert, moves all extremitiesx4 EXTREMITIES: pulses normal, full ROM SKIN: warm, color normal PSYCH: no abnormalities of mood noted  ED Course  [place] bedside Performed by: Rebecca [name] Authorized by: Rebecca [name] Consent: Verbal consent obtained. Consent given by: patient Comments: 1st trimester limited ultrasound to evaluate for IUP Multiple images in transverse  and sagittal views were recorded +fetal activity noted +gestational sac and yolk sac identified    DIAGNOSTIC STUDIES: Oxygen Saturation is 98% on room air, normal by my interpretation.    COORDINATION OF CARE: 2:02PM EDP discusses pt ED treatment course with pt.  2:02PM EDP orders medication: zofran 4 mg, NaCl 0.9% bolus Limited bedside US reveals IUP Labs Reviewed  URINALYSIS, ROUTINE W REFLEX MICROSCOPIC - Abnormal; Notable for the following:    APPearance CLOUDY (*)    Bilirubin Urine SMALL (*)    Ketones, ur 40 (*)    All other components within normal limits  PREGNANCY, URINE -  Abnormal; Notable for the following:    Preg Test, Ur POSITIVE (*)    All other components within normal limits  POCT I-STAT, CHEM 8 - Abnormal; Notable for the following:    Calcium, Ion 1.35 (*)    All other components within normal limits  URINE MICROSCOPIC-ADD ON     Pt improved Taking PO fluids abd soft I doubt acute abd/gyn process  Suspect hyperemesis of early pregnancy Will switch to phenergen for her nausea Advised close f/u with obgyn      MDM  Nursing notes reviewed and considered in documentation All labs/vitals reviewed and considered Previous records reviewed and considered    I personally performed the services described in this documentation, which was scribed in my presence. The recorded information has been reviewed and considered.           Rebecca Gaskins, MD 09/09/11 2228

## 2011-09-09 NOTE — Discharge Instructions (Signed)
You have confirmed pregnancy and are likely in the first trimester  Hyperemesis Gravidarum Hyperemesis gravidarum is a severe form of nausea and vomiting that happens during pregnancy. Hyperemesis is worse than morning sickness. It may cause a woman to have nausea or vomiting all day for many days. It may keep a woman from eating and drinking enough food and liquids. Hyperemesis usually occurs during the first half (the first 20 weeks) of pregnancy. It often goes away once a woman is in her second half of pregnancy. However, sometimes hyperemesis continues through an entire pregnancy.  CAUSES  The cause of this condition is not completely known but is thought to be due to changes in the body's hormones when pregnant. It could be the high level of the pregnancy hormone or an increase in estrogen in the body.  SYMPTOMS   Severe nausea and vomiting.   Nausea that does not go away.   Vomiting that does not allow you to keep any food down.   Weight loss and body fluid loss (dehydration).   Having no desire to eat or not liking food you have previously enjoyed.  DIAGNOSIS  Your caregiver may ask you about your symptoms. Your caregiver may also order blood tests and urine tests to make sure something else is not causing the problem.  TREATMENT  You may only need medicine to control the problem. If medicines do not control the nausea and vomiting, you will be treated in the hospital to prevent dehydration, acidosis, weight loss, and changes in the electrolytes in your body that may harm the unborn baby (fetus). You may need intravenous (IV) fluids.  HOME CARE INSTRUCTIONS   Take all medicine as directed by your caregiver.   Try eating a couple of dry crackers or toast in the morning before getting out of bed.   Avoid foods and smells that upset your stomach.   Avoid fatty and spicy foods. Eat 5 to 6 small meals a day.   Do not drink when eating meals. Drink between meals.   For snacks, eat  high protein foods, such as cheese. Eat or suck on things that have ginger in them. Ginger helps nausea.   Avoid food preparation. The smell of food can spoil your appetite.   Avoid iron pills and iron in your multivitamins until after 3 to 4 months of being pregnant.  SEEK MEDICAL CARE IF:   Your abdominal pain increases since the last time you saw your caregiver.   You have a severe headache.   You develop vision problems.   You feel you are losing weight.  SEEK IMMEDIATE MEDICAL CARE IF:   You are unable to keep fluids down.   You vomit blood.   You have constant nausea and vomiting.   You have a fever.   You have excessive weakness, dizziness, fainting, or extreme thirst.  MAKE SURE YOU:   Understand these instructions.   Will watch your condition.   Will get help right away if you are not doing well or get worse.  Document Released: 05/02/2005 Document Revised: 04/21/2011 Document Reviewed: 08/02/2010 [facility] Patient Information 2012 [place], [place].

## 2011-09-09 NOTE — ED Notes (Signed)
Pt presents to er with c/o continued n/v and abd pain, pt states that she was seen here at the beginning of April and has not gotten any better, pain is located left lower quad area, has been taking zofran without relief, is unable to see obgyn until confirmation of pregnancy.

## 2011-09-20 ENCOUNTER — Other Ambulatory Visit (HOSPITAL_COMMUNITY)
Admission: RE | Admit: 2011-09-20 | Discharge: 2011-09-20 | Disposition: A | Discharge status: Home / Self Care | Payer: Medicaid Other | Source: Ambulatory Visit | Attending: Obstetrics and Gynecology | Admitting: Obstetrics and Gynecology

## 2011-09-20 ENCOUNTER — Other Ambulatory Visit: Payer: Self-pay | Admitting: Family Medicine

## 2011-09-20 DIAGNOSIS — Z113 Encounter for screening for infections with a predominantly sexual mode of transmission: Secondary | ICD-10-CM | POA: Insufficient documentation

## 2011-09-20 DIAGNOSIS — Z01419 Encounter for gynecological examination (general) (routine) without abnormal findings: Secondary | ICD-10-CM | POA: Insufficient documentation

## 2011-09-20 LAB — OB RESULTS CONSOLE ANTIBODY SCREEN: Antibody Screen: NEGATIVE

## 2011-09-20 LAB — OB RESULTS CONSOLE RPR: RPR: NONREACTIVE

## 2011-10-27 ENCOUNTER — Encounter (HOSPITAL_COMMUNITY): Payer: Self-pay | Admitting: Emergency Medicine

## 2011-10-27 ENCOUNTER — Ambulatory Visit (HOSPITAL_COMMUNITY)
Admission: RE | Admit: 2011-10-27 | Discharge: 2011-10-27 | Disposition: A | Discharge status: Home / Self Care | Payer: Medicaid Other | Source: Ambulatory Visit | Attending: Adult Health | Admitting: Adult Health

## 2011-10-27 ENCOUNTER — Emergency Department (HOSPITAL_COMMUNITY)
Admission: EM | Admit: 2011-10-27 | Discharge: 2011-10-28 | Disposition: A | Discharge status: Home / Self Care | Payer: Medicaid Other | Attending: Emergency Medicine | Admitting: Emergency Medicine

## 2011-10-27 ENCOUNTER — Emergency Department (HOSPITAL_COMMUNITY)
Admission: EM | Admit: 2011-10-27 | Discharge: 2011-10-27 | Disposition: A | Discharge status: Home / Self Care | Payer: Medicaid Other | Source: Home / Self Care | Attending: Emergency Medicine | Admitting: Emergency Medicine

## 2011-10-27 ENCOUNTER — Other Ambulatory Visit: Payer: Self-pay | Admitting: Adult Health

## 2011-10-27 DIAGNOSIS — K8020 Calculus of gallbladder without cholecystitis without obstruction: Secondary | ICD-10-CM

## 2011-10-27 DIAGNOSIS — R109 Unspecified abdominal pain: Secondary | ICD-10-CM

## 2011-10-27 DIAGNOSIS — K2970 Gastritis, unspecified, without bleeding: Secondary | ICD-10-CM | POA: Insufficient documentation

## 2011-10-27 DIAGNOSIS — R1013 Epigastric pain: Secondary | ICD-10-CM | POA: Insufficient documentation

## 2011-10-27 DIAGNOSIS — O26899 Other specified pregnancy related conditions, unspecified trimester: Secondary | ICD-10-CM | POA: Insufficient documentation

## 2011-10-27 DIAGNOSIS — O99891 Other specified diseases and conditions complicating pregnancy: Secondary | ICD-10-CM | POA: Insufficient documentation

## 2011-10-27 DIAGNOSIS — Z87891 Personal history of nicotine dependence: Secondary | ICD-10-CM | POA: Insufficient documentation

## 2011-10-27 DIAGNOSIS — R10815 Periumbilic abdominal tenderness: Secondary | ICD-10-CM | POA: Insufficient documentation

## 2011-10-27 LAB — CBC
HCT: 31.9 % — ABNORMAL LOW (ref 36.0–46.0)
Hemoglobin: 11.4 g/dL — ABNORMAL LOW (ref 12.0–15.0)
MCH: 28.7 pg (ref 26.0–34.0)
MCV: 80.4 fL (ref 78.0–100.0)
RBC: 3.97 MIL/uL (ref 3.87–5.11)
WBC: 15.7 10*3/uL — ABNORMAL HIGH (ref 4.0–10.5)

## 2011-10-27 LAB — DIFFERENTIAL
Eosinophils Absolute: 0 10*3/uL (ref 0.0–0.7)
Eosinophils Relative: 0 % (ref 0–5)
Lymphocytes Relative: 5 % — ABNORMAL LOW (ref 12–46)
Lymphs Abs: 0.8 10*3/uL (ref 0.7–4.0)
Monocytes Absolute: 0.3 10*3/uL (ref 0.1–1.0)
Monocytes Relative: 2 % — ABNORMAL LOW (ref 3–12)

## 2011-10-27 LAB — COMPREHENSIVE METABOLIC PANEL
ALT: 10 U/L (ref 0–35)
BUN: 10 mg/dL (ref 6–23)
CO2: 17 mEq/L — ABNORMAL LOW (ref 19–32)
Calcium: 9.9 mg/dL (ref 8.4–10.5)
Creatinine, Ser: 0.38 mg/dL — ABNORMAL LOW (ref 0.50–1.10)
GFR calc Af Amer: >90 mL/min (ref >90)
GFR calc non Af Amer: >90 mL/min (ref >90)
Glucose, Bld: 113 mg/dL — ABNORMAL HIGH (ref 70–99)
Sodium: 133 mEq/L — ABNORMAL LOW (ref 135–145)

## 2011-10-27 LAB — POCT PREGNANCY, URINE: Preg Test, Ur: POSITIVE — AB

## 2011-10-27 LAB — URINALYSIS, ROUTINE W REFLEX MICROSCOPIC
Bilirubin Urine: NEGATIVE
Glucose, UA: NEGATIVE mg/dL
Hgb urine dipstick: NEGATIVE
Specific Gravity, Urine: 1.034 — ABNORMAL HIGH (ref 1.005–1.030)

## 2011-10-27 LAB — URINE MICROSCOPIC-ADD ON

## 2011-10-27 MED ORDER — ONDANSETRON HCL 4 MG/2ML IJ SOLN
4.0000 mg | Freq: Once | INTRAMUSCULAR | Status: AC
  Administered 2011-10-27: 4 mg via INTRAVENOUS
  Filled 2011-10-27: qty 2

## 2011-10-27 MED ORDER — SODIUM CHLORIDE 0.9 % IV BOLUS (SEPSIS)
1000.0000 mL | INTRAVENOUS | Status: AC
  Administered 2011-10-27: 1000 mL via INTRAVENOUS

## 2011-10-27 MED ORDER — GI COCKTAIL ~~LOC~~
15.0000 mL | Freq: Once | ORAL | Status: AC
  Administered 2011-10-27: 15 mL via ORAL
  Filled 2011-10-27: qty 30

## 2011-10-27 MED ORDER — SIMETHICONE 80 MG PO CHEW
80.0000 mg | CHEWABLE_TABLET | Freq: Once | ORAL | Status: AC
  Administered 2011-10-27: 80 mg via ORAL
  Filled 2011-10-27: qty 1

## 2011-10-27 MED ORDER — SODIUM CHLORIDE 0.9 % IV SOLN
Freq: Once | INTRAVENOUS | Status: AC
  Administered 2011-10-28: 01:00:00 via INTRAVENOUS

## 2011-10-27 MED ORDER — PANTOPRAZOLE SODIUM 40 MG IV SOLR
40.0000 mg | Freq: Once | INTRAVENOUS | Status: AC
  Administered 2011-10-27: 40 mg via INTRAVENOUS
  Filled 2011-10-27: qty 40

## 2011-10-27 MED ORDER — PROMETHAZINE HCL 25 MG/ML IJ SOLN
12.5000 mg | Freq: Once | INTRAMUSCULAR | Status: DC
  Filled 2011-10-27: qty 1

## 2011-10-27 MED ORDER — ONDANSETRON HCL 4 MG/2ML IJ SOLN
4.0000 mg | Freq: Once | INTRAMUSCULAR | Status: AC
  Administered 2011-10-28: 4 mg via INTRAVENOUS
  Filled 2011-10-27: qty 2

## 2011-10-27 NOTE — ED Notes (Signed)
Patient states she is [redacted] wks pregnant and woke suddenly at approximately 0100 this morning with upper abdominal pain. States the pain comes and goes about every 15 seconds. Denies vaginal bleeding.

## 2011-10-27 NOTE — Discharge Instructions (Signed)
We have given you medicines here to try and make your pain better. If the pain continues, go to see Dr. Emelda Fear at 9:00 AM.

## 2011-10-27 NOTE — ED Provider Notes (Signed)
History     CSN: [id number]  Arrival date & time 10/27/11  4098   First MD Initiated Contact with Patient 10/27/11 0348      Chief Complaint  Patient presents with  . Abdominal Pain    (Consider location/radiation/quality/duration/timing/severity/associated sxs/prior treatment) HPI  Rebecca [name] is a 21 y.o. female who presents to the Emergency Department complaining of epigastric pain that woke her at 1 AM. Patient is G1,P0, [redacted] weeks pregnant with a h/o persistent vomiting with the pregnancy.She ate potato wedges two hours before going to bed at 2300. Had a bowel movement last night which was normal.Denies fever, chills, cough, shortness of breath. Has associated mild nausea, no vomiting.  OB/GYN Dr. Emelda Fear  Past Medical History  Diagnosis Date  . Migraine   . Pregnancy test-positive     History reviewed. No pertinent past surgical history.  Family History  Problem Relation Age of Onset  . Diabetes Mother     History  Substance Use Topics  . Smoking status: Former Games developer  . Smokeless tobacco: Not on file  . Alcohol Use: No    OB History    Grav Para Term Preterm Abortions TAB SAB Ect Mult Living   1               Review of Systems  Constitutional: Negative for fever.       10 Systems reviewed and are negative for acute change except as noted in the HPI.  HENT: Negative for congestion.   Eyes: Negative for discharge and redness.  Respiratory: Negative for cough and shortness of breath.   Cardiovascular: Negative for chest pain.  Gastrointestinal: Positive for abdominal pain. Negative for vomiting.  Musculoskeletal: Negative for back pain.  Skin: Negative for rash.  Neurological: Negative for syncope, numbness and headaches.  Psychiatric/Behavioral:       No behavior change.    Allergies  Benadryl and Nyquil  Home Medications   Current Outpatient Rx  Name Route Sig Dispense Refill  . PROMETHAZINE HCL 25 MG PO TABS Oral Take 1 tablet (25 mg  total) by mouth every 6 (six) hours as needed for nausea. 30 tablet 0    BP 101/53  Pulse 72  Temp 97.9 F (36.6 C) (Oral)  Resp 16  Ht 5\' 4"  (1.626 m)  Wt 105 lb (47.628 kg)  BMI 18.02 kg/m2  SpO2 99%  LMP 07/14/2011  Physical Exam  Nursing note and vitals reviewed. Constitutional: She appears well-developed and well-nourished.       Awake, alert, nontoxic appearance.  HENT:  Head: Normocephalic and atraumatic.  Eyes: Conjunctivae and EOM are normal. Pupils are equal, round, and reactive to light. Right eye exhibits no discharge. Left eye exhibits no discharge.  Neck: Neck supple.  Cardiovascular: Normal rate and normal heart sounds.   Pulmonary/Chest: Effort normal and breath sounds normal. She exhibits no tenderness.  Abdominal: Soft. There is tenderness. There is no rebound.       Epigastric tenderness to palpation  Musculoskeletal: She exhibits no tenderness.       Baseline ROM, no obvious new focal weakness.  Neurological:       Mental status and motor strength appears baseline for patient and situation.  Skin: No rash noted.  Psychiatric: She has a normal mood and affect.    ED Course  Procedures (including critical care time)   7:05 AM:  T/C to [facility], OB/GYN, case discussed, including:  HPI, pertinent PM/SHx, VS/PE, dx testing, ED course and treatment.  Agreeable to follow up in the office.  Requests we give phenergan. MDM  Patient is [redacted] weeks pregnant here with sharp stabbing epigastric pains that began at 1 AM. She's been given IV fluids proton pump inhibitor ,Zofran, GI cocktail, and simethicone without significant relief of the discomfort. No vomiting or diarrhea since arrival.Discussed possible need for abdominal films and risk to pregnancy. She does not wants xrays done. Patient wants to follow up with OB/GYN. Pt stable in ED with no significant deterioration in condition.The patient appears reasonably screened and/or stabilized for discharge and I doubt any other  medical condition or other [facility] requiring further screening, evaluation, or treatment in the ED at this time prior to discharge.  MDM Reviewed: nursing note and vitals         Nicoletta Dress. Colon Branch, MD 10/27/11 [id number]

## 2011-10-27 NOTE — ED Notes (Signed)
Patient states the only relief she gets from her pain is to "ball up into a fetal position. But when I change positions it starts hurting again."

## 2011-10-27 NOTE — ED Notes (Signed)
Patient is lying in bed crying. States she is still hurting. Gave medication per order. Will reassess pain.

## 2011-10-27 NOTE — ED Notes (Addendum)
Patient reports no relief from pain with medication administration. Advised MD.

## 2011-10-27 NOTE — ED Notes (Signed)
Patient complaining of mid abdominal pain that started at 0100 this morning; reports nausea and vomiting.  Denies diarrhea.  Patient was seen at [facility] this morning; patient was seen at Dr. Emelda Fear and then was referred to [facility] or [facility].  Last period February 28th; patient [redacted] weeks pregnant.  EDD December 11th.

## 2011-10-27 NOTE — ED Provider Notes (Cosign Needed)
History     CSN: [id number]  Arrival date & time 10/27/11  2006   First MD Initiated Contact with Patient 10/27/11 2338      Chief Complaint  Patient presents with  . Abdominal Pain    (Consider location/radiation/quality/duration/timing/severity/associated sxs/prior treatment) HPI Comments: Patient is G1 @[redacted]  weeks gestation.  Presents with n/v and abd pain.  This is primarily in the upper abd.  She was seen at [facility] this AM and had [place], given fluids but is feeling worse now.  No bleeding or spotting.  No fevers, chills, or urinary complaints.  The are no aggravating or alleviating factors.  Patient is a 21 y.o. female presenting with abdominal pain. The history is provided by the patient.  Abdominal Pain The primary symptoms of the illness include abdominal pain.    Past Medical History  Diagnosis Date  . Migraine   . Pregnancy test-positive     History reviewed. No pertinent past surgical history.  Family History  Problem Relation Age of Onset  . Diabetes Mother     History  Substance Use Topics  . Smoking status: Former Games developer  . Smokeless tobacco: Not on file  . Alcohol Use: No    OB History    Grav Para Term Preterm Abortions TAB SAB Ect Mult Living   1               Review of Systems  Gastrointestinal: Positive for abdominal pain.  All other systems reviewed and are negative.    Allergies  Benadryl and Nyquil  Home Medications   Current Outpatient Rx  Name Route Sig Dispense Refill  . HYDROCODONE-ACETAMINOPHEN 5-500 MG PO TABS Oral Take 1 tablet by mouth every 4 (four) hours as needed. For pain      BP 122/64  Pulse 94  Temp 98.5 F (36.9 C) (Oral)  Resp 18  SpO2 98%  LMP 07/14/2011  Physical Exam  Nursing note and vitals reviewed. Constitutional: She is oriented to person, place, and time. She appears well-developed and well-nourished. No distress.  HENT:  Head: Normocephalic and atraumatic.  Neck: Normal range of motion. Neck supple.    Cardiovascular: Normal rate and regular rhythm.  Exam reveals no gallop and no friction rub.   No murmur heard. Pulmonary/Chest: Effort normal and breath sounds normal. No respiratory distress. She has no wheezes.  Abdominal: Soft. Bowel sounds are normal. She exhibits no distension.       TTp in the periumbilical region.  There is no rebound or guarding.  Musculoskeletal: Normal range of motion.  Neurological: She is alert and oriented to person, place, and time.  Skin: Skin is warm and dry. She is not diaphoretic.    ED Course  Procedures (including critical care time)  Labs Reviewed  URINALYSIS, ROUTINE W REFLEX MICROSCOPIC - Abnormal; Notable for the following:    APPearance CLOUDY (*)     Specific Gravity, Urine 1.034 (*)     Ketones, ur >80 (*)     Protein, ur 30 (*)     All other components within normal limits  CBC - Abnormal; Notable for the following:    WBC 15.7 (*)     Hemoglobin 11.4 (*)     HCT 31.9 (*)     All other components within normal limits  DIFFERENTIAL - Abnormal; Notable for the following:    Neutrophils Relative 93 (*)     Neutro Abs 14.6 (*)     Lymphocytes Relative 5 (*)  Monocytes Relative 2 (*)     All other components within normal limits  COMPREHENSIVE METABOLIC PANEL - Abnormal; Notable for the following:    Sodium 133 (*)     CO2 17 (*)     Glucose, Bld 113 (*)     Creatinine, Ser 0.38 (*)     All other components within normal limits  POCT PREGNANCY, URINE - Abnormal; Notable for the following:    Preg Test, Ur POSITIVE (*)     All other components within normal limits  URINE MICROSCOPIC-ADD ON - Abnormal; Notable for the following:    Squamous Epithelial / LPF MANY (*)     All other components within normal limits   US Abdomen Complete  10/27/2011  *RADIOLOGY REPORT*  Clinical Data:  Abdominal pain in a pregnant patient.  COMPLETE ABDOMINAL ULTRASOUND  Comparison:  None.  Findings:  Gallbladder:  No gallstones, gallbladder wall  thickening, or pericholecystic fluid.  Common bile duct:  Measures 0.4 cm.  Liver:  No focal lesion identified.  Within normal limits in parenchymal echogenicity.  IVC:  Appears normal.  Pancreas:  No focal abnormality seen.  Spleen:  Measures 7.1 cm and appears normal in  Right Kidney:  Measures 10.8 cm appears normal.  Left Kidney:  Measures 10.3 cm and appears normal.  Abdominal aorta:  No aneurysm identified.  IMPRESSION: Negative abdominal ultrasound.  Original Report Authenticated By: Bernadene Bell. Maricela Curet, M.D.     No diagnosis found.    MDM  The labs show a mildly elevated wbc count and co2 of 17.  She was hydrated and medicated and is feeling much better.  I suspect this is likely viral.  I doubt appendicitis, gallbladder and this was not seen on the [place] from this morning.  I attempted to discharge the patient, however she reported being in too much discomfort to go home.  She was given an additional dose of morphine, again without much relief.  I ordered an ultrasound as the [place] at [facility] did not comment on the status of the pregnancy.  This was read as and unremarkable, uncomplicated iup.  She is not feeling much better, has an elevated wbc, and has no other explanation for her symptoms, so I elected to obtain an mri of the abdomen to rule out appendicitis.  The test has been performed but the results are pending at this point.  I will sign care over to Dr. Ranae Palms at shift change.       Geoffery Lyons, MD 10/28/11 7829  Geoffery Lyons, MD 10/30/11 5621

## 2011-10-28 ENCOUNTER — Emergency Department (HOSPITAL_COMMUNITY): Payer: Medicaid Other

## 2011-10-28 MED ORDER — PANTOPRAZOLE SODIUM 20 MG PO TBEC: 20.0000 mg | DELAYED_RELEASE_TABLET | Freq: Every day | ORAL | Status: DC

## 2011-10-28 MED ORDER — SODIUM CHLORIDE 0.9 % IV SOLN
Freq: Once | INTRAVENOUS | Status: AC
  Administered 2011-10-28: 02:00:00 via INTRAVENOUS

## 2011-10-28 MED ORDER — MORPHINE SULFATE 4 MG/ML IJ SOLN
4.0000 mg | Freq: Once | INTRAMUSCULAR | Status: AC
  Administered 2011-10-28: 4 mg via INTRAVENOUS
  Filled 2011-10-28: qty 1

## 2011-10-28 MED ORDER — GI COCKTAIL ~~LOC~~
30.0000 mL | Freq: Once | ORAL | Status: AC
  Administered 2011-10-28: 30 mL via ORAL
  Filled 2011-10-28: qty 30

## 2011-10-28 MED ORDER — ONDANSETRON HCL 4 MG PO TABS: 4.0000 mg | ORAL_TABLET | Freq: Four times a day (QID) | ORAL | Status: AC

## 2011-10-28 MED ORDER — DICYCLOMINE HCL 20 MG PO TABS: 20.0000 mg | ORAL_TABLET | Freq: Two times a day (BID) | ORAL | Status: DC

## 2011-10-28 MED ORDER — PANTOPRAZOLE SODIUM 40 MG PO TBEC
40.0000 mg | DELAYED_RELEASE_TABLET | Freq: Once | ORAL | Status: AC
  Administered 2011-10-28: 40 mg via ORAL
  Filled 2011-10-28: qty 1

## 2011-10-28 NOTE — ED Notes (Signed)
Returned from U/S

## 2011-10-28 NOTE — ED Notes (Signed)
No change in pain. Curled up on bed. Dr. Judd Lien updated.

## 2011-10-28 NOTE — ED Notes (Signed)
Pt up to bathroom.Reports return of pain with movement. Bent over when walking. Pain increased - tearful.  Dr. Judd Lien updated and verbal order rec'd to repeat Morphine 4mg  IV x1

## 2011-10-28 NOTE — Discharge Instructions (Signed)
Abdominal Pain During Pregnancy  Abdominal discomfort is common in pregnancy. Most of the time, it does not cause harm. There are many causes of abdominal pain. Some causes are more serious than others. Some of the causes of abdominal pain in pregnancy are easily diagnosed. Occasionally, the diagnosis takes time to understand. Other times, the cause is not determined. Abdominal pain can be a sign that something is very wrong with the pregnancy, or the pain may have nothing to do with the pregnancy at all. For this reason, always tell your caregiver if you have any abdominal discomfort.  CAUSES  Common and harmless causes of abdominal pain include:   Constipation.   Excess gas and bloating.   Round ligament pain. This is pain that is felt in the folds of the groin.   The position the baby or placenta is in.   Baby kicks.   Braxton-Hicks contractions. These are mild contractions that do not cause cervical dilation.  Serious causes of abdominal pain include:   Ectopic pregnancy. This happens when a fertilized egg implants outside of the uterus.   Miscarriage.   Preterm labor. This is when labor starts at less than 37 weeks of pregnancy.   Placental abruption. This is when the placenta partially or completely separates from the uterus.   Preeclampsia. This is often associated with high blood pressure and has been referred to as "toxemia in pregnancy."   Uterine or amniotic fluid infections.  Causes unrelated to pregnancy include:   Urinary tract infection.   Gallbladder stones or inflammation.   Hepatitis or other liver illness.   Intestinal problems, stomach flu, food poisoning, or ulcer.   Appendicitis.   Kidney (renal) stones.   Kidney infection (pylonephritis).  HOME CARE INSTRUCTIONS   For mild pain:   Do not have sexual intercourse or put anything in your vagina until your symptoms go away completely.   Get plenty of rest until your pain improves. If your pain does not improve in 1 hour, call  your caregiver.   Drink clear fluids if you feel nauseous. Avoid solid food as long as you are uncomfortable or nauseous.   Only take medicine as directed by your caregiver.   Keep all follow-up appointments with your caregiver.  SEEK IMMEDIATE MEDICAL CARE IF:   You are bleeding, leaking fluid, or passing tissue from the vagina.   You have increasing pain or cramping.   You have persistent vomiting.   You have painful or bloody urination.   You have a fever.   You notice a decrease in your baby's movements.   You have extreme weakness or feel faint.   You have shortness of breath, with or without abdominal pain.   You develop a severe headache with abdominal pain.   You have abnormal vaginal discharge with abdominal pain.   You have persistent diarrhea.   You have abdominal pain that continues even after rest, or gets worse.  MAKE SURE YOU:    Understand these instructions.   Will watch your condition.   Will get help right away if you are not doing well or get worse.  Document Released: 05/02/2005 Document Revised: 04/21/2011 Document Reviewed: 11/26/2010  [employer] Patient Information 2012 [employer].

## 2011-10-28 NOTE — ED Notes (Signed)
Transported to [place].

## 2011-10-28 NOTE — ED Provider Notes (Signed)
MRI negative. Pt admits to gastritis related to EtOH consumption prior to pregnancy. Will check lipase, treat with Gi meds and re-eval.   Pt states she is feeling much better. Will d/c home follow up with PMD. Return for concerns  Loren Racer, MD 10/31/11 2350

## 2012-04-03 LAB — OB RESULTS CONSOLE GBS: GBS: NEGATIVE

## 2012-04-03 LAB — OB RESULTS CONSOLE GC/CHLAMYDIA
Chlamydia: NEGATIVE
Gonorrhea: NEGATIVE

## 2012-04-21 ENCOUNTER — Inpatient Hospital Stay (HOSPITAL_COMMUNITY)
Admission: EM | Admit: 2012-04-21 | Discharge: 2012-04-21 | Disposition: A | Discharge status: Other Acute Inpatient Hospital | Payer: Medicaid Other | Attending: Emergency Medicine | Admitting: Emergency Medicine

## 2012-04-21 ENCOUNTER — Encounter (HOSPITAL_COMMUNITY): Payer: Self-pay | Admitting: *Deleted

## 2012-04-21 DIAGNOSIS — O331 Maternal care for disproportion due to generally contracted pelvis: Secondary | ICD-10-CM | POA: Insufficient documentation

## 2012-04-21 DIAGNOSIS — Z87891 Personal history of nicotine dependence: Secondary | ICD-10-CM | POA: Insufficient documentation

## 2012-04-21 DIAGNOSIS — G43909 Migraine, unspecified, not intractable, without status migrainosus: Secondary | ICD-10-CM | POA: Insufficient documentation

## 2012-04-21 DIAGNOSIS — Z79899 Other long term (current) drug therapy: Secondary | ICD-10-CM | POA: Insufficient documentation

## 2012-04-21 DIAGNOSIS — Z3A40 40 weeks gestation of pregnancy: Secondary | ICD-10-CM

## 2012-04-21 MED ORDER — ZOLPIDEM TARTRATE 5 MG PO TABS: 5.0000 mg | ORAL_TABLET | Freq: Every evening | ORAL | Status: DC | PRN

## 2012-04-21 NOTE — MAU Note (Signed)
Pt transferred from [facility] for labor check. Pt c/o of ctx x 2 days. 2cm dilated at [facility]. Denies SROM or bleeding and reports good fetal movment

## 2012-04-21 NOTE — ED Notes (Signed)
FHR 147

## 2012-04-21 NOTE — ED Notes (Signed)
Pt 9 months pregnant - ? Contractions that started yesterday.  Denies drainage.

## 2012-04-21 NOTE — ED Notes (Signed)
Para 2, gravida 0

## 2012-04-21 NOTE — Progress Notes (Signed)
Received call from [facility].  States they have a patient that has been there for at least 20 min and on the EFM but had not called to place her in the OBIX system so  The EFM was not capturing. She presents with c/o ctxs x2 days, she is 2cm dialated and does not appear to be in active labor.  EMS at [facility] to transport.  Pt placed in obix but nothing captured.  Drue Flirt, RNC

## 2012-04-21 NOTE — ED Provider Notes (Signed)
History     CSN: [id number]  Arrival date & time 04/21/12  1014   First MD Initiated Contact with Patient 04/21/12 1031      Chief Complaint  Patient presents with  . Contractions    (Consider location/radiation/quality/duration/timing/severity/associated sxs/prior treatment) HPI Pt is G2P0A1 at [redacted]wks gestation with an uncomplicated pregnancy reports intermittent contractions starting last night, more frequent this morning. Denies gush of fluid or vaginal bleeding. Complains of intermittent moderate pressure pain in groin.   Past Medical History  Diagnosis Date  . Migraine   . Pregnancy test-positive     History reviewed. No pertinent past surgical history.  Family History  Problem Relation Age of Onset  . Diabetes Mother     History  Substance Use Topics  . Smoking status: Former Games developer  . Smokeless tobacco: Not on file  . Alcohol Use: No    OB History    Grav Para Term Preterm Abortions TAB SAB Ect Mult Living   1               Review of Systems All other systems reviewed and are negative except as noted in HPI.   Allergies  Benadryl and Nyquil  Home Medications   Current Outpatient Rx  Name  Route  Sig  Dispense  Refill  . DICYCLOMINE HCL 20 MG PO TABS   Oral   Take 1 tablet (20 mg total) by mouth 2 (two) times daily.   20 tablet   0   . HYDROCODONE-ACETAMINOPHEN 5-500 MG PO TABS   Oral   Take 1 tablet by mouth every 4 (four) hours as needed. For pain         . PANTOPRAZOLE SODIUM 20 MG PO TBEC   Oral   Take 1 tablet (20 mg total) by mouth daily.   30 tablet   0     BP 149/88  Pulse 98  Temp 98.1 F (36.7 C) (Oral)  Resp 20  Ht 5\' 4"  (1.626 m)  Wt 141 lb (63.957 kg)  BMI 24.20 kg/m2  SpO2 100%  LMP 07/14/2011  Physical Exam  Nursing note and vitals reviewed. Constitutional: She is oriented to person, place, and time. She appears well-developed and well-nourished.  HENT:  Head: Normocephalic and atraumatic.  Eyes: EOM are  normal. Pupils are equal, round, and reactive to light.  Neck: Normal range of motion. Neck supple.  Cardiovascular: Normal rate, normal heart sounds and intact distal pulses.   Pulmonary/Chest: Effort normal and breath sounds normal.  Abdominal: Bowel sounds are normal.       Gravid  Genitourinary:       Cervix 2cm, 50% effaced and high  Musculoskeletal: Normal range of motion. She exhibits no edema and no tenderness.  Neurological: She is alert and oriented to person, place, and time. She has normal strength. No cranial nerve deficit or sensory deficit.  Skin: Skin is warm and dry. No rash noted.  Psychiatric: She has a normal mood and affect.    ED Course  Procedures (including critical care time)  Labs Reviewed - No data to display No results found.   No diagnosis found.    MDM  Discussed with Dr. Despina Hidden who will accept the patient in transfer to [facility] for further eval.        Charles B. Bernette Mayers, MD 04/21/12 1039

## 2012-04-21 NOTE — ED Notes (Signed)
Due date is 04/25/12. Pt states contractions x 2 days, which are closer today. Upon exam, pt is dilated 2cm and ~ 50% effaced. Pt is currently in no distress, laughing and talking to family and staff.

## 2012-04-24 ENCOUNTER — Encounter (HOSPITAL_COMMUNITY): Payer: Self-pay | Admitting: Anesthesiology

## 2012-04-24 ENCOUNTER — Encounter (HOSPITAL_COMMUNITY): Payer: Self-pay

## 2012-04-24 ENCOUNTER — Telehealth (HOSPITAL_COMMUNITY): Payer: Self-pay | Admitting: *Deleted

## 2012-04-24 ENCOUNTER — Inpatient Hospital Stay (HOSPITAL_COMMUNITY)
Admission: AD | Admit: 2012-04-24 | Discharge: 2012-04-27 | DRG: 775 | Disposition: A | Discharge status: Home / Self Care | Payer: Medicaid Other | Source: Ambulatory Visit | Attending: Family Medicine | Admitting: Family Medicine

## 2012-04-24 ENCOUNTER — Encounter (HOSPITAL_COMMUNITY): Payer: Self-pay | Admitting: *Deleted

## 2012-04-24 ENCOUNTER — Inpatient Hospital Stay (HOSPITAL_COMMUNITY): Payer: Medicaid Other | Admitting: Anesthesiology

## 2012-04-24 DIAGNOSIS — O4202 Full-term premature rupture of membranes, onset of labor within 24 hours of rupture: Secondary | ICD-10-CM

## 2012-04-24 HISTORY — DX: Herpesviral infection, unspecified: B00.9

## 2012-04-24 LAB — COMPREHENSIVE METABOLIC PANEL
Albumin: 2.9 g/dL — ABNORMAL LOW (ref 3.5–5.2)
BUN: 7 mg/dL (ref 6–23)
Creatinine, Ser: 0.55 mg/dL (ref 0.50–1.10)
Total Bilirubin: 0.2 mg/dL — ABNORMAL LOW (ref 0.3–1.2)
Total Protein: 7 g/dL (ref 6.0–8.3)

## 2012-04-24 LAB — CBC
Platelets: 198 10*3/uL (ref 150–400)
RDW: 13.6 % (ref 11.5–15.5)
WBC: 11.6 10*3/uL — ABNORMAL HIGH (ref 4.0–10.5)

## 2012-04-24 LAB — PROTEIN / CREATININE RATIO, URINE
Creatinine, Urine: 48.29 mg/dL
Total Protein, Urine: 5.8 mg/dL

## 2012-04-24 MED ORDER — LACTATED RINGERS IV SOLN: 500.0000 mL | Freq: Once | INTRAVENOUS | Status: DC

## 2012-04-24 MED ORDER — LIDOCAINE HCL (PF) 1 % IJ SOLN
30.0000 mL | INTRAMUSCULAR | Status: DC | PRN
  Administered 2012-04-25: 30 mL via SUBCUTANEOUS
  Filled 2012-04-24: qty 30

## 2012-04-24 MED ORDER — FLEET ENEMA 7-19 GM/118ML RE ENEM: 1.0000 | ENEMA | RECTAL | Status: DC | PRN

## 2012-04-24 MED ORDER — LACTATED RINGERS IV SOLN
INTRAVENOUS | Status: DC
  Administered 2012-04-24: 17:00:00 via INTRAVENOUS

## 2012-04-24 MED ORDER — OXYCODONE-ACETAMINOPHEN 5-325 MG PO TABS: 1.0000 | ORAL_TABLET | ORAL | Status: DC | PRN

## 2012-04-24 MED ORDER — FENTANYL 2.5 MCG/ML BUPIVACAINE 1/10 % EPIDURAL INFUSION (WH - ANES)
INTRAMUSCULAR | Status: DC | PRN
  Administered 2012-04-24: 14 mL/h via EPIDURAL

## 2012-04-24 MED ORDER — LACTATED RINGERS IV SOLN
500.0000 mL | INTRAVENOUS | Status: DC | PRN
  Administered 2012-04-24 (×2): 1000 mL via INTRAVENOUS

## 2012-04-24 MED ORDER — FENTANYL 2.5 MCG/ML BUPIVACAINE 1/10 % EPIDURAL INFUSION (WH - ANES)
14.0000 mL/h | INTRAMUSCULAR | Status: DC
  Filled 2012-04-24: qty 125

## 2012-04-24 MED ORDER — FENTANYL CITRATE 0.05 MG/ML IJ SOLN: 100.0000 ug | INTRAMUSCULAR | Status: DC | PRN

## 2012-04-24 MED ORDER — ONDANSETRON HCL 4 MG/2ML IJ SOLN: 4.0000 mg | Freq: Four times a day (QID) | INTRAMUSCULAR | Status: DC | PRN

## 2012-04-24 MED ORDER — LIDOCAINE HCL (PF) 1 % IJ SOLN
INTRAMUSCULAR | Status: DC | PRN
  Administered 2012-04-24 (×2): 4 mL

## 2012-04-24 MED ORDER — TERBUTALINE SULFATE 1 MG/ML IJ SOLN: 0.2500 mg | Freq: Once | INTRAMUSCULAR | Status: AC | PRN

## 2012-04-24 MED ORDER — PHENYLEPHRINE 40 MCG/ML (10ML) SYRINGE FOR IV PUSH (FOR BLOOD PRESSURE SUPPORT): 80.0000 ug | PREFILLED_SYRINGE | INTRAVENOUS | Status: DC | PRN

## 2012-04-24 MED ORDER — NALBUPHINE SYRINGE 5 MG/0.5 ML
5.0000 mg | INJECTION | Freq: Once | INTRAMUSCULAR | Status: AC
  Administered 2012-04-24: 5 mg via INTRAVENOUS
  Filled 2012-04-24: qty 0.5

## 2012-04-24 MED ORDER — EPHEDRINE 5 MG/ML INJ
10.0000 mg | INTRAVENOUS | Status: DC | PRN
  Filled 2012-04-24: qty 4

## 2012-04-24 MED ORDER — IBUPROFEN 600 MG PO TABS: 600.0000 mg | ORAL_TABLET | Freq: Four times a day (QID) | ORAL | Status: DC | PRN

## 2012-04-24 MED ORDER — PHENYLEPHRINE 40 MCG/ML (10ML) SYRINGE FOR IV PUSH (FOR BLOOD PRESSURE SUPPORT)
80.0000 ug | PREFILLED_SYRINGE | INTRAVENOUS | Status: DC | PRN
  Filled 2012-04-24: qty 5

## 2012-04-24 MED ORDER — FENTANYL CITRATE 0.05 MG/ML IJ SOLN
100.0000 ug | INTRAMUSCULAR | Status: DC | PRN
  Administered 2012-04-24: 100 ug via INTRAVENOUS
  Filled 2012-04-24: qty 2

## 2012-04-24 MED ORDER — OXYTOCIN 40 UNITS IN LACTATED RINGERS INFUSION - SIMPLE MED: 1.0000 m[IU]/min | INTRAVENOUS | Status: DC

## 2012-04-24 MED ORDER — OXYTOCIN 40 UNITS IN LACTATED RINGERS INFUSION - SIMPLE MED
62.5000 mL/h | INTRAVENOUS | Status: DC
  Administered 2012-04-25: 500 mL/h via INTRAVENOUS
  Filled 2012-04-24: qty 1000

## 2012-04-24 MED ORDER — CITRIC ACID-SODIUM CITRATE 334-500 MG/5ML PO SOLN: 30.0000 mL | ORAL | Status: DC | PRN

## 2012-04-24 MED ORDER — EPHEDRINE 5 MG/ML INJ: 10.0000 mg | INTRAVENOUS | Status: DC | PRN

## 2012-04-24 MED ORDER — ACETAMINOPHEN 325 MG PO TABS: 650.0000 mg | ORAL_TABLET | ORAL | Status: DC | PRN

## 2012-04-24 MED ORDER — OXYTOCIN BOLUS FROM INFUSION: 500.0000 mL | INTRAVENOUS | Status: DC

## 2012-04-24 NOTE — MAU Provider Note (Signed)
Chief Complaint:  Vaginal Discharge and Labor Eval  @MAUPATCONTACT @  HPI: Rebecca [name] is a 21 y.o. G2P0010 at 77w6dpresenting with c/o leakage of fluids. When she woke up this morning a small area of her sheets were wet. She felt like she had to go to the bathroom, when she got to the toilet she noticed fluid dripping down her leg. She has been experiencing abdominal cramping for 4-5 minutes at a time since 0600 this morning. When she arrived at the MAU she states her underwear was wet. Denies vaginal bleeding. Good fetal movement. Pt of FT. GBS (-)    Past Medical History: Past Medical History  Diagnosis Date  . Migraine   . Pregnancy test-positive   . HPV (human papilloma virus) anogenital infection   . HSV-2 infection      Past Surgical History: History reviewed. No pertinent past surgical history.  Family History: Family History  Problem Relation Age of Onset  . Hypertension Maternal Grandmother   . Heart disease Maternal Grandfather     Social History: History  Substance Use Topics  . Smoking status: Former Smoker    Quit date: 04/24/2009  . Smokeless tobacco: Never Used  . Alcohol Use: No    Allergies:  Allergies  Allergen Reactions  . Benadryl (Diphenhydramine Hcl) Anaphylaxis and Hives  . Nyquil (Pseudoeph-Doxylamine-Dm-Apap) Anaphylaxis, Hives and Nausea And Vomiting    Meds:  Prescriptions prior to admission  Medication Sig Dispense Refill  . dicyclomine (BENTYL) 20 MG tablet Take 1 tablet (20 mg total) by mouth 2 (two) times daily.  20 tablet  0  . HYDROcodone-acetaminophen (VICODIN) 5-500 MG per tablet Take 1 tablet by mouth every 4 (four) hours as needed. For pain      . pantoprazole (PROTONIX) 20 MG tablet Take 1 tablet (20 mg total) by mouth daily.  30 tablet  0  . zolpidem (AMBIEN) 5 MG tablet Take 1 tablet (5 mg total) by mouth at bedtime as needed for sleep.  10 tablet  0    ROS: Pertinent findings in history of present  illness.  Physical Exam  Blood pressure 138/74, pulse 111, temperature 98.7 F (37.1 C), temperature source Oral, resp. rate 16, height 5' 3.5" (1.613 m), weight 64.411 kg (142 lb), last menstrual period 07/14/2011, SpO2 100.00%. GENERAL: Well-developed, well-nourished female in no acute distress.  HEENT: normocephalic HEART: normal rate RESP: normal effort ABDOMEN: Soft, non-tender, gravid appropriate for gestational age EXTREMITIES: Nontender, no edema NEURO: alert and oriented  Dilation: 2 Effacement (%): 70 Cervical Position: Anterior Station: -2 Presentation: Vertex Exam by:: Leftwich-Kirby   Speculum exam: positive pooling of clear fluid, no visible HSV lesions internal or external  Positive ferning  FHT:  Baseline 150 , moderate variability, accelerations present, no decelerations Contractions: irregular, every 2-5 minutes   Labs: Results for orders placed during the hospital encounter of 04/24/12 (from the past 24 hour(s))  CBC     Status: Abnormal   Collection Time   04/24/12  4:55 PM      Component Value Range   WBC 11.6 (*) 4.0 - 10.5 K/uL   RBC 4.42  3.87 - 5.11 MIL/uL   Hemoglobin 12.7  12.0 - 15.0 g/dL   HCT 16.1  09.6 - 04.5 %   MCV 84.8  78.0 - 100.0 fL   MCH 28.7  26.0 - 34.0 pg   MCHC 33.9  30.0 - 36.0 g/dL   RDW 40.9  81.1 - 91.4 %  Platelets 198  150 - 400 K/uL    Assessment: 1. Full-term premature rupture of membranes with onset of labor within 24 hours of rupture   GBS negative    Plan: Admit to labor and delivery Expectant management    Sharen Counter Certified Nurse-Midwife 04/24/2012 6:20 PM

## 2012-04-24 NOTE — Anesthesia Procedure Notes (Signed)
Epidural Patient location during procedure: OB Start time: 04/24/2012 11:46 PM  Staffing Anesthesiologist: Salome Hautala A. Performed by: anesthesiologist   Preanesthetic Checklist Completed: patient identified, site marked, surgical consent, pre-op evaluation, timeout performed, IV checked, risks and benefits discussed and monitors and equipment checked  Epidural Patient position: sitting Prep: site prepped and draped and DuraPrep Patient monitoring: continuous pulse ox and blood pressure Approach: midline Injection technique: LOR air  Needle:  Needle type: Tuohy  Needle gauge: 17 G Needle length: 9 cm and 9 Needle insertion depth: 4 cm Catheter type: closed end flexible Catheter size: 19 Gauge Catheter at skin depth: 10 cm Test dose: negative and Other  Assessment Events: blood not aspirated, injection not painful, no injection resistance, negative IV test and no paresthesia  Additional Notes Patient identified. Risks and benefits discussed including failed block, incomplete  Pain control, post dural puncture headache, nerve damage, paralysis, blood pressure Changes, nausea, vomiting, reactions to medications-both toxic and allergic and post Partum back pain. All questions were answered. Patient expressed understanding and wished to proceed. Sterile technique was used throughout procedure. Epidural site was Dressed with sterile barrier dressing. No paresthesias, signs of intravascular injection Or signs of intrathecal spread were encountered.  Patient was more comfortable after the epidural was dosed. Please see RN's note for documentation of vital signs and FHR which are stable.

## 2012-04-24 NOTE — H&P (Signed)
Chart reviewed and agree with management and plan.  

## 2012-04-24 NOTE — Anesthesia Preprocedure Evaluation (Signed)
Anesthesia Evaluation  Patient identified by MRN, date of birth, ID band Patient awake    Reviewed: Allergy & Precautions, H&P , Patient's Chart, lab work & pertinent test results  Airway Mallampati: III TM Distance: >3 FB Neck ROM: full    Dental No notable dental hx. (+) Teeth Intact   Pulmonary neg pulmonary ROS, former smoker,  breath sounds clear to auscultation  Pulmonary exam normal       Cardiovascular negative cardio ROS  Rhythm:regular Rate:Normal     Neuro/Psych  Headaches, negative psych ROS   GI/Hepatic negative GI ROS, Neg liver ROS,   Endo/Other  negative endocrine ROS  Renal/GU negative Renal ROS   HSV Anogenital HPV negative genitourinary   Musculoskeletal negative musculoskeletal ROS (+)   Abdominal Normal abdominal exam  (+)   Peds  Hematology negative hematology ROS (+)   Anesthesia Other Findings   Reproductive/Obstetrics (+) Pregnancy                           Anesthesia Physical Anesthesia Plan  ASA: II  Anesthesia Plan: Epidural   Post-op Pain Management:    Induction:   Airway Management Planned:   Additional Equipment:   Intra-op Plan:   Post-operative Plan:   Informed Consent: I have reviewed the patients History and Physical, chart, labs and discussed the procedure including the risks, benefits and alternatives for the proposed anesthesia with the patient or authorized representative who has indicated his/her understanding and acceptance.     Plan Discussed with: Anesthesiologist  Anesthesia Plan Comments:         Anesthesia Quick Evaluation

## 2012-04-24 NOTE — MAU Provider Note (Signed)
Chart reviewed and agree with management and plan.  

## 2012-04-24 NOTE — Progress Notes (Signed)
Diedre Poe cnm notified of patient. Advised to have Misty Stanley cnm to see patient.

## 2012-04-24 NOTE — MAU Note (Signed)
Patient is in with c/o possible SROM AT 0700AM. Perineum is not wet at this time. She also c/o painful contractions. Reports decreased fetal movement.

## 2012-04-24 NOTE — H&P (Signed)
Rebecca [name] is a 21 y.o. G2P0010 at [redacted]w[redacted]d patient of [facility] presenting with c/o SROM @ 0700 on 12/10 and contractions. When she woke up this morning a small area of her sheets were wet. She felt like she had to go to the bathroom, when she got to the toilet she noticed fluid dripping down her leg. She has been experiencing abdominal cramping for 4-5 minutes at a time since 0600 this morning. When she arrived at the MAU she states her underwear was wet. Denies vaginal bleeding.  (+) UDS in early pregnancy for THC, negative at 28 weeks. (+)HSV2, pt denies symptoms or lesions, took Valtrex until 2 weeks ago, ran out of prescription  Maternal Medical History:  Reason for admission: Reason for admission: rupture of membranes and contractions.  Reason for Admission:   nauseaContractions: Onset was 6-12 hours ago.   Frequency: regular.   Perceived severity is strong.    Fetal activity: Perceived fetal activity is normal.   Last perceived fetal movement was within the past hour.    Prenatal complications: no prenatal complications Prenatal Complications - Diabetes: none.    OB History    Grav Para Term Preterm Abortions TAB SAB Ect Mult Living   2 0 0  1  1   0     Past Medical History  Diagnosis Date  . Migraine   . Pregnancy test-positive   . HPV (human papilloma virus) anogenital infection   . HSV-2 infection    History reviewed. No pertinent past surgical history. Family History: family history includes Heart disease in her maternal grandfather and Hypertension in her maternal grandmother. Social History:  reports that she quit smoking about 3 years ago. She has never used smokeless tobacco. She reports that she uses illicit drugs (Marijuana). She reports that she does not drink alcohol.   Prenatal Transfer Tool  Maternal Diabetes: No Genetic Screening: Normal Maternal Ultrasounds/Referrals: Normal Fetal Ultrasounds or other Referrals:  None Maternal Substance Abuse:   No, positive THC in early pregnancy but negative @ 28 weeks Significant Maternal Medications:  None Significant Maternal Lab Results:  Lab values include: Group B Strep negative Other Comments:  None  Review of Systems  Constitutional: Negative for fever and chills.  Eyes: Negative for blurred vision.  Gastrointestinal: Positive for abdominal pain. Negative for nausea, vomiting, diarrhea and constipation.  Genitourinary: Negative.   Neurological: Negative for dizziness and headaches.      Blood pressure 146/84, pulse 120, temperature 98.4 F (36.9 C), temperature source Oral, resp. rate 16, height 5' 3.5" (1.613 m), weight 64.411 kg (142 lb), last menstrual period 07/14/2011, SpO2 100.00%. Maternal Exam:  Uterine Assessment: Contraction strength is moderate.  Contraction duration is 70 seconds. Contraction frequency is irregular.   Abdomen: Fetal presentation: vertex  Introitus: Normal vulva. Normal vagina.  Ferning test: positive.  Nitrazine test: not done. Amniotic fluid character: clear.  Cervix: Cervix evaluated by sterile speculum exam and digital exam.     Fetal Exam Fetal Monitor Review: Mode: ultrasound.   Baseline rate: 150.  Variability: moderate (6-25 bpm).   Pattern: accelerations present and no decelerations.    Fetal State Assessment: Category I - tracings are normal.     Physical Exam  Constitutional: She is oriented to person, place, and time. She appears well-developed and well-nourished. No distress.  Cardiovascular: Normal rate, regular rhythm and normal heart sounds.   Respiratory: Effort normal and breath sounds normal.  GI: There is no tenderness.  Genitourinary: Vagina normal.  Neurological: She is alert and oriented to person, place, and time.  Skin: Skin is warm and dry. She is not diaphoretic.  Psychiatric: She has a normal mood and affect. Her behavior is normal. Judgment and thought content normal.    No HSV lesions internally or externally  noted during exam  Prenatal labs: ABO, Rh: O/Positive/-- (05/07 0000) Antibody: Negative (05/07 0000) Rubella: Immune (05/07 0000) RPR: Nonreactive (05/07 0000)  HBsAg: Negative (05/07 0000)  HIV: Non-reactive (05/07 0000)  GBS: Negative (11/19 0000)  2hr GTT: 82, 78, 116  Assessment/Plan: A: SROM at [redacted]w[redacted]d  GBS negative  P: admit to birthing suites, expectant management   Corky Downs 04/24/2012, 3:59 PM    I have seen this patient and agree with the above resident's note.  LEFTWICH-KIRBY, Tobby Fawcett Certified Nurse-Midwife

## 2012-04-24 NOTE — Progress Notes (Signed)
HARLEAN REGULA is a 21 y.o. G2P0010 at 100w6d  Subjective: Pain with contractions and bloody show, doing well  Objective: BP 111/78  Pulse 101  Temp 98.1 F (36.7 C) (Oral)  Resp 16  Ht 5' 3.5" (1.613 m)  Wt 64.411 kg (142 lb)  BMI 24.76 kg/m2  SpO2 100%  LMP 07/14/2011      FHT:  FHR: 130-140 bpm, variability: moderate,  accelerations:  Present,  decelerations:  Present some occasional variables vs monitor picking up maternal rate UC:   regular, every 5-8 minutes, some coupling SVE:   Dilation: 3 Effacement (%): 90 Station: -1 Exam by:: Rocky Rishel  Labs: Lab Results  Component Value Date   WBC 11.6* 04/24/2012   HGB 12.7 04/24/2012   HCT 37.5 04/24/2012   MCV 84.8 04/24/2012   PLT 198 04/24/2012    Assessment / Plan: Spontaneous labor, progressing normally  Labor: Progressing slowly, will augment with Pitocin, start 1 x1 Preeclampsia:  intermittently elevated BP's but pro/Cr ratio 0.12, minimal edema; no plans for magnesium at the moment Fetal Wellbeing:  Category I Pain Control:  Fentanyl, planning epidural Anticipated MOD:  NSVD  Letti Towell, Cristal Deer 04/24/2012, 9:21 PM

## 2012-04-24 NOTE — Telephone Encounter (Signed)
Preadmission screen  

## 2012-04-24 NOTE — MAU Note (Signed)
Patient states she is having irregular contractions and had leaking fluid at 0600. Has had some more leaking but does not have on a a pad. States not feeling much movement.

## 2012-04-25 ENCOUNTER — Encounter (HOSPITAL_COMMUNITY): Payer: Self-pay | Admitting: *Deleted

## 2012-04-25 LAB — RPR: RPR Ser Ql: NONREACTIVE

## 2012-04-25 MED ORDER — LANOLIN HYDROUS EX OINT: TOPICAL_OINTMENT | CUTANEOUS | Status: DC | PRN

## 2012-04-25 MED ORDER — SENNOSIDES-DOCUSATE SODIUM 8.6-50 MG PO TABS
2.0000 | ORAL_TABLET | Freq: Every day | ORAL | Status: DC
  Administered 2012-04-25 – 2012-04-26 (×2): 2 via ORAL

## 2012-04-25 MED ORDER — IBUPROFEN 600 MG PO TABS
600.0000 mg | ORAL_TABLET | Freq: Four times a day (QID) | ORAL | Status: DC
  Administered 2012-04-25 – 2012-04-27 (×10): 600 mg via ORAL
  Filled 2012-04-25 (×10): qty 1

## 2012-04-25 MED ORDER — WITCH HAZEL-GLYCERIN EX PADS: 1.0000 "application " | MEDICATED_PAD | CUTANEOUS | Status: DC | PRN

## 2012-04-25 MED ORDER — DIBUCAINE 1 % RE OINT: 1.0000 "application " | TOPICAL_OINTMENT | RECTAL | Status: DC | PRN

## 2012-04-25 MED ORDER — OXYCODONE-ACETAMINOPHEN 5-325 MG PO TABS
1.0000 | ORAL_TABLET | ORAL | Status: DC | PRN
  Administered 2012-04-25 – 2012-04-27 (×4): 1 via ORAL
  Filled 2012-04-25 (×4): qty 1

## 2012-04-25 MED ORDER — ZOLPIDEM TARTRATE 5 MG PO TABS
5.0000 mg | ORAL_TABLET | Freq: Every evening | ORAL | Status: DC | PRN
Start: 2012-04-25 — End: 2012-04-27

## 2012-04-25 MED ORDER — ONDANSETRON HCL 4 MG PO TABS
4.0000 mg | ORAL_TABLET | ORAL | Status: DC | PRN
Start: 2012-04-25 — End: 2012-04-27

## 2012-04-25 MED ORDER — LACTATED RINGERS IV SOLN
INTRAVENOUS | Status: DC
  Administered 2012-04-25: 300 mL via INTRAUTERINE

## 2012-04-25 MED ORDER — SIMETHICONE 80 MG PO CHEW: 80.0000 mg | CHEWABLE_TABLET | ORAL | Status: DC | PRN

## 2012-04-25 MED ORDER — PRENATAL MULTIVITAMIN CH
1.0000 | ORAL_TABLET | Freq: Every day | ORAL | Status: DC
  Administered 2012-04-25 – 2012-04-27 (×3): 1 via ORAL
  Filled 2012-04-25 (×3): qty 1

## 2012-04-25 MED ORDER — ONDANSETRON HCL 4 MG/2ML IJ SOLN: 4.0000 mg | INTRAMUSCULAR | Status: DC | PRN

## 2012-04-25 MED ORDER — BENZOCAINE-MENTHOL 20-0.5 % EX AERO: 1.0000 "application " | INHALATION_SPRAY | CUTANEOUS | Status: DC | PRN

## 2012-04-25 MED ORDER — TETANUS-DIPHTH-ACELL PERTUSSIS 5-2.5-18.5 LF-MCG/0.5 IM SUSP
0.5000 mL | Freq: Once | INTRAMUSCULAR | Status: AC
  Administered 2012-04-26: 0.5 mL via INTRAMUSCULAR
  Filled 2012-04-25: qty 0.5

## 2012-04-25 NOTE — Progress Notes (Addendum)
Rebecca [name] is a 21 y.o. G2P0010 at [redacted]w[redacted]d  Subjective: Not feeling pain or pressure with epidural, reports good fetal movements, states she is comfortable.  Objective: BP 92/62  Pulse 119  Temp 99.2 F (37.3 C) (Oral)  Resp 18  Ht 5' 3.5" (1.613 m)  Wt 64.411 kg (142 lb)  BMI 24.76 kg/m2  SpO2 97%  LMP 07/14/2011      FHT:  FHR: 140 bpm, variability: moderate,  accelerations:  Present,  decelerations:  Present large 5 minute decel to nadir of 60, improved with several repositioning attempts; otherwise intermittent variables UC:   regular, every 1-3 minutes, tocometry with poor pick-up SVE:   Dilation: 5.5 Effacement (%): 100 Station: -2 Exam by:: Chief Technology Officer (Deal RN placed IFSE)  Labs: Lab Results  Component Value Date   WBC 11.6* 04/24/2012   HGB 12.7 04/24/2012   HCT 37.5 04/24/2012   MCV 84.8 04/24/2012   PLT 198 04/24/2012    Assessment / Plan: Spontaneous labor, progressing normally -started on Pit only up to 1 milliunit, then decel noted above -FHR tracing improved  Labor: Progressing, will monitor closely given decel and fewer accels recently Preeclampsia:  BP's improved, no plans for magnesium as of yet Fetal Wellbeing:  Category II, decel(s) as above Pain Control:  Epidural I/D:  n/a Anticipated MOD:  NSVD  Above and delivery plans dicussed with Philipp Deputy, CNM  Kyjuan Gause 04/25/2012, 12:48 AM

## 2012-04-25 NOTE — Progress Notes (Signed)
UR chart review completed.  

## 2012-04-25 NOTE — Progress Notes (Signed)
Few brief, 15 bpm decels noted but tocometry not picking up well to define in relation to contractions. Some improvement with repositioning, but pt complaining of significant pain on left side, "like before I got the epidural." Pain is consistent, worse with contractions, and there is some correlating left lower abdominal tenderness. Significant bloody show.  Discussed with Philipp Deputy, CNM. IUPC placed to better monitor contractions. FHR tracing Cat II, baseline 130-140, moderate variability, few accels with occasional variable decels. Will continue to monitor expectantly.  Bobbye Morton, MD PGY-1, [facility]

## 2012-04-25 NOTE — Progress Notes (Signed)
Called to room by RN. Pt recently got epidural placed, baby's heart rate now with decel down to 60's for ~5 minutes not improving much with repositioning and fluid bolus. Cervix checked by RN, 5-6 and paper thin. Some improvement in baby's heart rate with Trendelenburg, then hands-and-knees positioning. Myself and Philipp Deputy, CNM in room to evaluate/manage.  Bobbye Morton, MD PGY-1, [facility]

## 2012-04-25 NOTE — Anesthesia Postprocedure Evaluation (Signed)
  Anesthesia Post-op Note  Patient: Rebecca [name]  Procedure(s) Performed: * No procedures listed *  Patient Location: PACU and Mother/Baby  Anesthesia Type:Epidural  Level of Consciousness: awake, alert , oriented and patient cooperative  Airway and Oxygen Therapy: Patient Spontanous Breathing  Post-op Pain: mild  Post-op Assessment: Patient's Cardiovascular Status Stable, Respiratory Function Stable, Patent Airway, No signs of Nausea or vomiting and Adequate PO intake  Post-op Vital Signs: Reviewed and stable  Complications: No apparent anesthesia complications

## 2012-04-26 ENCOUNTER — Encounter (HOSPITAL_COMMUNITY): Payer: Self-pay

## 2012-04-26 NOTE — Clinical Social Work Maternal (Signed)
    Clinical Social Work Department PSYCHOSOCIAL ASSESSMENT - MATERNAL/CHILD 04/26/2012  Patient:  Rebecca [name], Rebecca [name]  Account Number:  [id number]  Admit Date:  04/24/2012  Marjo Bicker Name:   Barbarann Ehlers    Clinical Social Worker:  Nobie Putnam, LCSW   Date/Time:  04/26/2012 03:00 PM  Date Referred:  04/26/2012   Referral source  CN     Referred reason  Substance Abuse   Other referral source:    I:  FAMILY / HOME ENVIRONMENT Child's legal guardian:  PARENT  Guardian - Name Guardian - Age Guardian - Address  Rebecca [name] 21 [address]; [place], [place] [address]  Alvester Chou 24    Other household support members/support persons Other support:   Grandmother    II  PSYCHOSOCIAL DATA Information Source:  Patient Interview  Event organiser Employment:   Surveyor, quantity resources:  [employer] If Medicaid - County:  [employer] Other  Sales executive  WIC  Section 8   School / Grade:   Maternity Care Coordinator / Child Services Coordination / Early Interventions:  Cultural issues impacting care:    III  STRENGTHS Strengths  Adequate Resources  Home prepared for Child (including basic supplies)  Supportive family/friends   Strength comment:    IV  RISK FACTORS AND CURRENT PROBLEMS Current Problem:  YES   Risk Factor & Current Problem Patient Issue Family Issue Risk Factor / Current Problem Comment  Substance Abuse Y N Hx of MJ use    V  SOCIAL WORK ASSESSMENT CSW met with pt to assess history of MJ use.  She admits to smoking MJ prior to pregnancy confirmation at 6 weeks.  Pt told CSW she stopped as soon as she found out about pregnancy.  She denies other illegal substance use and verbalized understanding of hospital drug testing policy. UDS is negative, meconium results are pending.  Pt lives alone.  Her support system is limited to her grandmother and FOB.  Pt does not have a relationship with her mother, as she was removed from her care at 21  years old.  She has all the necessary supplies for the infant and appears to be bonding well.  CSW will continue to monitor drug screen results and make a referral if needed.      VI SOCIAL WORK PLAN Social Work Plan  No Further Intervention Required / No Barriers to Discharge   Type of pt/family education:   If child protective services report - county:   If child protective services report - date:   Information/referral to community resources comment:   Other social work plan:

## 2012-04-26 NOTE — Progress Notes (Signed)
Post Partum Day 1  Subjective: no complaints, up ad lib, voiding, tolerating PO and + flatus. C/O low back soreness that she attributes to being in bed and getting an epidural. Better w/ standing. Normal gait and sensation in lower extremities.   Objective: Blood pressure 140/92, pulse 112, temperature 97.7 F (36.5 C), temperature source Oral, resp. rate 18, height 5' 3.5" (1.613 m), weight 64.411 kg (142 lb), last menstrual period 07/14/2011, SpO2 99.00%, unknown if currently breastfeeding. Patient Vitals for the past 24 hrs:  BP Temp Temp src Pulse Resp SpO2  04/26/12 1418 140/92 mmHg 97.7 F (36.5 C) Oral 112  18  -  04/26/12 0648 128/71 mmHg 97.2 F (36.2 C) Oral 81  18  -  04/25/12 2122 132/69 mmHg 98.8 F (37.1 C) Oral 76  18  -  04/25/12 1811 128/76 mmHg 98 F (36.7 C) - 92  18  99 %    Physical Exam:  General: alert, cooperative, appears stated age and no distress Lochia: appropriate Uterine Fundus: firm Incision: NA DVT Evaluation: Negative Homan's sign.   Basename 04/24/12 1655  HGB 12.7  HCT 37.5    Assessment/Plan: Plan for discharge tomorrow and Contraception OCPs vs Nexplanon Bottle Feeding OP circ   LOS: 2 days   Dorathy Kinsman 04/26/2012, 4:24 PM

## 2012-04-27 NOTE — Discharge Summary (Signed)
Obstetric Discharge Summary Reason for Admission: onset of labor and rupture of membranes Prenatal Procedures: NST and ultrasound Intrapartum Procedures: spontaneous vaginal delivery Postpartum Procedures: none Complications-Operative and Postpartum: none Hemoglobin  Date Value Range Status  04/24/2012 12.7  12.0 - 15.0 g/dL Final     HCT  Date Value Range Status  04/24/2012 37.5  36.0 - 46.0 % Final    Physical Exam:  General: alert, cooperative and no distress Lochia: appropriate Uterine Fundus: firm DVT Evaluation: No evidence of DVT seen on physical exam.  Discharge Diagnoses: Term Pregnancy-delivered  Discharge Information: Date: 04/27/2012 Activity: pelvic rest Diet: routine Medications: None Condition: stable Instructions: refer to practice specific booklet Discharge to: home Follow-up Information    Follow up with [facility]. Schedule an appointment as soon as possible for a visit in 5 weeks.   Contact information:   [address] [place] [address] [phone]         Newborn Data: Live born female  Birth Weight: 5 lb 11 oz (2580 g) APGAR: 4, 8  Home with mother.  CRESENZO-DISHMAN,Alveta Quintela 04/27/2012, 7:26 AM

## 2012-05-02 ENCOUNTER — Inpatient Hospital Stay (HOSPITAL_COMMUNITY): Admission: RE | Admit: 2012-05-02 | Payer: Medicaid Other | Source: Ambulatory Visit

## 2012-10-24 ENCOUNTER — Encounter: Payer: Self-pay | Admitting: *Deleted

## 2012-10-25 ENCOUNTER — Other Ambulatory Visit: Payer: Self-pay | Admitting: Obstetrics & Gynecology

## 2012-10-25 ENCOUNTER — Ambulatory Visit (INDEPENDENT_AMBULATORY_CARE_PROVIDER_SITE_OTHER): Payer: Medicaid Other

## 2012-10-25 DIAGNOSIS — O3680X1 Pregnancy with inconclusive fetal viability, fetus 1: Secondary | ICD-10-CM

## 2012-10-25 DIAGNOSIS — O3680X0 Pregnancy with inconclusive fetal viability, not applicable or unspecified: Secondary | ICD-10-CM

## 2012-11-05 ENCOUNTER — Ambulatory Visit (INDEPENDENT_AMBULATORY_CARE_PROVIDER_SITE_OTHER): Payer: Medicaid Other | Admitting: Women's Health

## 2012-11-05 ENCOUNTER — Encounter: Payer: Self-pay | Admitting: Women's Health

## 2012-11-05 ENCOUNTER — Other Ambulatory Visit (HOSPITAL_COMMUNITY)
Admission: RE | Admit: 2012-11-05 | Discharge: 2012-11-05 | Disposition: A | Discharge status: Home / Self Care | Payer: Medicaid Other | Source: Ambulatory Visit | Attending: Obstetrics & Gynecology | Admitting: Obstetrics & Gynecology

## 2012-11-05 VITALS — BP 120/60 | Wt 115.5 lb

## 2012-11-05 DIAGNOSIS — Z3480 Encounter for supervision of other normal pregnancy, unspecified trimester: Secondary | ICD-10-CM | POA: Insufficient documentation

## 2012-11-05 DIAGNOSIS — O09899 Supervision of other high risk pregnancies, unspecified trimester: Secondary | ICD-10-CM | POA: Insufficient documentation

## 2012-11-05 DIAGNOSIS — Z1389 Encounter for screening for other disorder: Secondary | ICD-10-CM

## 2012-11-05 DIAGNOSIS — Z3481 Encounter for supervision of other normal pregnancy, first trimester: Secondary | ICD-10-CM

## 2012-11-05 DIAGNOSIS — O09891 Supervision of other high risk pregnancies, first trimester: Secondary | ICD-10-CM

## 2012-11-05 DIAGNOSIS — Z01419 Encounter for gynecological examination (general) (routine) without abnormal findings: Secondary | ICD-10-CM | POA: Insufficient documentation

## 2012-11-05 DIAGNOSIS — O09299 Supervision of pregnancy with other poor reproductive or obstetric history, unspecified trimester: Secondary | ICD-10-CM

## 2012-11-05 DIAGNOSIS — Z331 Pregnant state, incidental: Secondary | ICD-10-CM

## 2012-11-05 LAB — POCT URINALYSIS DIPSTICK
Blood, UA: NEGATIVE
Glucose, UA: NEGATIVE
Ketones, UA: NEGATIVE

## 2012-11-05 LAB — CBC
MCH: 28.8 pg (ref 26.0–34.0)
Platelets: 297 10*3/uL (ref 150–400)
RBC: 4.48 MIL/uL (ref 3.87–5.11)

## 2012-11-05 LAB — RPR

## 2012-11-05 MED ORDER — PRENATAL PLUS 27-1 MG PO TABS: 1.0000 | ORAL_TABLET | Freq: Every day | ORAL | Status: DC

## 2012-11-05 NOTE — Addendum Note (Signed)
Addended by: Colen Darling on: 11/05/2012 11:25 AM   Modules accepted: Orders

## 2012-11-05 NOTE — Patient Instructions (Signed)
Nausea & Vomiting  Have saltine crackers or pretzels by your bed and eat a few bites before you raise your head out of bed in the morning  Eat small frequent meals throughout the day instead of large meals  Drink plenty of fluids throughout the day to stay hydrated, just don't drink a lot of fluids with your meals.  This can make your stomach fill up faster making you feel sick  Do not brush your teeth right after you eat  Products with real ginger are good for nausea, like ginger ale and ginger hard candy Make sure it says made with real ginger!  Sucking on sour candy like lemon heads is also good for nausea  If your prenatal vitamins make you nauseated, take them at night so you will sleep through the nausea  If you feel like you need medicine for the nausea & vomiting please let us know  If you are unable to keep any fluids or food down please let us know    Pregnancy - First Trimester During sexual intercourse, millions of sperm go into the vagina. Only 1 sperm will penetrate and fertilize the female egg while it is in the Fallopian tube. One week later, the fertilized egg implants into the wall of the uterus. An embryo begins to develop into a baby. At 6 to 8 weeks, the eyes and face are formed and the heartbeat can be seen on ultrasound. At the end of 12 weeks (first trimester), all the baby's organs are formed. Now that you are pregnant, you will want to do everything you can to have a healthy baby. Two of the most important things are to get good prenatal care and follow your caregiver's instructions. Prenatal care is all the medical care you receive before the baby's birth. It is given to prevent, find, and treat problems during the pregnancy and childbirth. PRENATAL EXAMS  During prenatal visits, your weight, blood pressure, and urine are checked. This is done to make sure you are healthy and progressing normally during the pregnancy.  A pregnant woman should gain 25 to 35 pounds  during the pregnancy. However, if you are overweight or underweight, your caregiver will advise you regarding your weight.  Your caregiver will ask and answer questions for you.  Blood work, cervical cultures, other necessary tests, and a Pap test are done during your prenatal exams. These tests are done to check on your health and the probable health of your baby. Tests are strongly recommended and done for HIV with your permission. This is the virus that causes AIDS. These tests are done because medicines can be given to help prevent your baby from being born with this infection should you have been infected without knowing it. Blood work is also used to find out your blood type, previous infections, and follow your blood levels (hemoglobin).  Low hemoglobin (anemia) is common during pregnancy. Iron and vitamins are given to help prevent this. Later in the pregnancy, blood tests for diabetes will be done along with any other tests if any problems develop.  You may need other tests to make sure you and the baby are doing well. CHANGES DURING THE FIRST TRIMESTER  Your body goes through many changes during pregnancy. They vary from person to person. Talk to your caregiver about changes you notice and are concerned about. Changes can include:  Your menstrual period stops.  The egg and sperm carry the genes that determine what you look like. Genes from you   and your partner are forming a baby. The female genes determine whether the baby is a boy or a girl.  Your body increases in girth and you may feel bloated.  Feeling sick to your stomach (nauseous) and throwing up (vomiting). If the vomiting is uncontrollable, call your caregiver.  Your breasts will begin to enlarge and become tender.  Your nipples may stick out more and become darker.  The need to urinate more. Painful urination may mean you have a bladder infection.  Tiring easily.  Loss of appetite.  Cravings for certain kinds of  food.  At first, you may gain or lose a couple of pounds.  You may have changes in your emotions from day to day (excited to be pregnant or concerned something may go wrong with the pregnancy and baby).  You may have more vivid and strange dreams. HOME CARE INSTRUCTIONS   It is very important to avoid all smoking, alcohol and non-prescribed drugs during your pregnancy. These affect the formation and growth of the baby. Avoid chemicals while pregnant to ensure the delivery of a healthy infant.  Start your prenatal visits by the 12th week of pregnancy. They are usually scheduled monthly at first, then more often in the last 2 months before delivery. Keep your caregiver's appointments. Follow your caregiver's instructions regarding medicine use, blood and lab tests, exercise, and diet.  During pregnancy, you are providing food for you and your baby. Eat regular, well-balanced meals. Choose foods such as meat, fish, milk and other low fat dairy products, vegetables, fruits, and whole-grain breads and cereals. Your caregiver will tell you of the ideal weight gain.  You can help morning sickness by keeping soda crackers at the bedside. Eat a couple before arising in the morning. You may want to use the crackers without salt on them.  Eating 4 to 5 small meals rather than 3 large meals a day also may help the nausea and vomiting.  Drinking liquids between meals instead of during meals also seems to help nausea and vomiting.  A physical sexual relationship may be continued throughout pregnancy if there are no other problems. Problems may be early (premature) leaking of amniotic fluid from the membranes, vaginal bleeding, or belly (abdominal) pain.  Exercise regularly if there are no restrictions. Check with your caregiver or physical therapist if you are unsure of the safety of some of your exercises. Greater weight gain will occur in the last 2 trimesters of pregnancy. Exercising will  help:  Control your weight.  Keep you in shape.  Prepare you for labor and delivery.  Help you lose your pregnancy weight after you deliver your baby.  Wear a good support or jogging bra for breast tenderness during pregnancy. This may help if worn during sleep too.  Ask when prenatal classes are available. Begin classes when they are offered.  Do not use hot tubs, steam rooms, or saunas.  Wear your seat belt when driving. This protects you and your baby if you are in an accident.  Avoid raw meat, uncooked cheese, cat litter boxes, and soil used by cats throughout the pregnancy. These carry germs that can cause birth defects in the baby.  The first trimester is a good time to visit your dentist for your dental health. Getting your teeth cleaned is okay. Use a softer toothbrush and brush gently during pregnancy.  Ask for help if you have financial, counseling, or nutritional needs during pregnancy. Your caregiver will be able to offer counseling for   these needs as well as refer you for other special needs.  Do not take any medicines or herbs unless told by your caregiver.  Inform your caregiver if there is any mental or physical domestic violence.  Make a list of emergency phone numbers of family, friends, hospital, and police and fire departments.  Write down your questions. Take them to your prenatal visit.  Do not douche.  Do not cross your legs.  If you have to stand for long periods of time, rotate you feet or take small steps in a circle.  You may have more vaginal secretions that may require a sanitary pad. Do not use tampons or scented sanitary pads. MEDICINES AND DRUG USE IN PREGNANCY  Take prenatal vitamins as directed. The vitamin should contain 1 milligram of folic acid. Keep all vitamins out of reach of children. Only a couple vitamins or tablets containing iron may be fatal to a baby or young child when ingested.  Avoid use of all medicines, including herbs,  over-the-counter medicines, not prescribed or suggested by your caregiver. Only take over-the-counter or prescription medicines for pain, discomfort, or fever as directed by your caregiver. Do not use aspirin, ibuprofen, or naproxen unless directed by your caregiver.  Let your caregiver also know about herbs you may be using.  Alcohol is related to a number of birth defects. This includes fetal alcohol syndrome. All alcohol, in any form, should be avoided completely. Smoking will cause low birth rate and premature babies.  Street or illegal drugs are very harmful to the baby. They are absolutely forbidden. A baby born to an addicted mother will be addicted at birth. The baby will go through the same withdrawal an adult does.  Let your caregiver know about any medicines that you have to take and for what reason you take them. SEEK MEDICAL CARE IF:  You have any concerns or worries during your pregnancy. It is better to call with your questions if you feel they cannot wait, rather than worry about them. SEEK IMMEDIATE MEDICAL CARE IF:   An unexplained oral temperature above 102 F (38.9 C) develops, or as your caregiver suggests.  You have leaking of fluid from the vagina (birth canal). If leaking membranes are suspected, take your temperature and inform your caregiver of this when you call.  There is vaginal spotting or bleeding. Notify your caregiver of the amount and how many pads are used.  You develop a bad smelling vaginal discharge with a change in the color.  You continue to feel sick to your stomach (nauseated) and have no relief from remedies suggested. You vomit blood or coffee ground-like materials.  You lose more than 2 pounds of weight in 1 week.  You gain more than 2 pounds of weight in 1 week and you notice swelling of your face, hands, feet, or legs.  You gain 5 pounds or more in 1 week (even if you do not have swelling of your hands, face, legs, or feet).  You get  exposed to German measles and have never had them.  You are exposed to fifth disease or chickenpox.  You develop belly (abdominal) pain. Round ligament discomfort is a common non-cancerous (benign) cause of abdominal pain in pregnancy. Your caregiver still must evaluate this.  You develop headache, fever, diarrhea, pain with urination, or shortness of breath.  You fall or are in a car accident or have any kind of trauma.  There is mental or physical violence in your home. Document   Released: 04/26/2001 Document Revised: 01/25/2012 Document Reviewed: 10/28/2008 [employer] Patient Information 2014 [employer].  

## 2012-11-05 NOTE — Addendum Note (Signed)
Addended by: Colen Darling on: 11/05/2012 11:44 AM   Modules accepted: Orders

## 2012-11-05 NOTE — Progress Notes (Signed)
  Subjective:    Rebecca [name] is a 22 y.o. G74P1011 African American female at [redacted]w[redacted]d by LMP which correlates well w/ 8.6wk u/s, being seen today for her first obstetrical visit.  Her obstetrical history is significant for short interval pregnancy, had term SVD 04/2012.  Pregnancy history fully reviewed.   Patient reports occasional RLP, some nausea when she doesn't eat. Denies cramping, vb, UTI s/s.   Filed Vitals:   11/05/12 0959  BP: 120/60  Weight: 115 lb 8 oz (52.39 kg)    HISTORY: OB History   Grav Para Term Preterm Abortions TAB SAB Ect Mult Living   3 1 1  1  1   1      # Outc Date GA Lbr Len/2nd Wgt Sex Del Anes PTL Lv   1 SAB 2012           2 TRM 12/13 [redacted]w[redacted]d 20:30 / 00:17 5lb11oz(2.58kg) M VAC EPI  Yes   3 CUR              Past Medical History  Diagnosis Date  . Migraine   . Pregnancy test-positive   . HPV (human papilloma virus) anogenital infection   . HSV-2 infection   . BV (bacterial vaginosis)   . Hx of chlamydia infection    Past Surgical History  Procedure Laterality Date  . No past surgeries     Family History  Problem Relation Age of Onset  . Hypertension Maternal Grandmother   . Heart disease Maternal Grandfather   . Stroke Other   . Mental illness Other   . Diabetes Other      Exam    Pelvic Exam:    Perineum: Normal Perineum   Vulva: normal   Vagina:  normal mucosa, normal discharge, no palpable nodules   Uterus   normal size/contour for 10wks     Cervix: normal   Adnexa: Not palpable   Urinary: urethral meatus normal    System:     Skin: normal coloration and turgor, no rashes    Neurologic: oriented, normal mood   Extremities: normal strength, tone, and muscle mass   HEENT PERRLA   Mouth/Teeth mucous membranes moist   Cardiovascular: regular rate and rhythm   Respiratory:  appears well, vitals normal, no respiratory distress, acyanotic, normal RR   Abdomen: soft, non-tender    Thin prep pap smear obtained  FHR 170    Assessment:    Pregnancy: G3P1011 Patient Active Problem List   Diagnosis Date Noted  . Supervision of other normal pregnancy 11/05/2012    Priority: High      [redacted]w[redacted]d G3P1011 New OB visit Short interval pregnancy Nausea    Plan:     Initial labs drawn Continue prenatal vitamins E-scribed refill for prenatal plus w/ 12RF Problem list reviewed and updated Reviewed n/v relief measures and warning s/s to report Reviewed recommended weight gain based on pre-gravid BMI Encouraged well-balanced diet Genetic Screening discussed Integrated Screen: requested Cystic fibrosis screening discussed neg previous pregnancy Ultrasound discussed; fetal survey: requested Follow up in 2 weeks for 1st IT/NT and visit  Marge Duncans 11/05/2012 11:03 AM

## 2012-11-05 NOTE — Progress Notes (Signed)
Pain in right side. New OB packet given. Consents signed.

## 2012-11-06 ENCOUNTER — Encounter: Payer: Self-pay | Admitting: Women's Health

## 2012-11-06 LAB — HIV ANTIBODY (ROUTINE TESTING W REFLEX): HIV: NONREACTIVE

## 2012-11-06 LAB — HEPATITIS B SURFACE ANTIGEN: Hepatitis B Surface Ag: NEGATIVE

## 2012-11-06 LAB — URINALYSIS
Glucose, UA: NEGATIVE mg/dL
Specific Gravity, Urine: 1.026 (ref 1.005–1.030)
pH: 6.5 (ref 5.0–8.0)

## 2012-11-06 LAB — DRUG SCREEN, URINE, NO CONFIRMATION
Benzodiazepines.: NEGATIVE
Creatinine,U: 282.9 mg/dL
Marijuana Metabolite: NEGATIVE
Methadone: NEGATIVE
Opiate Screen, Urine: NEGATIVE
Propoxyphene: NEGATIVE

## 2012-11-06 LAB — ABO AND RH

## 2012-11-06 LAB — URINE CULTURE
Colony Count: NO GROWTH
Organism ID, Bacteria: NO GROWTH

## 2012-11-06 LAB — GC/CHLAMYDIA PROBE AMP: CT Probe RNA: NEGATIVE

## 2012-11-10 ENCOUNTER — Encounter: Payer: Self-pay | Admitting: Women's Health

## 2012-12-03 ENCOUNTER — Other Ambulatory Visit (INDEPENDENT_AMBULATORY_CARE_PROVIDER_SITE_OTHER): Payer: Medicaid Other

## 2012-12-03 ENCOUNTER — Other Ambulatory Visit: Payer: Self-pay | Admitting: Obstetrics & Gynecology

## 2012-12-03 ENCOUNTER — Ambulatory Visit (INDEPENDENT_AMBULATORY_CARE_PROVIDER_SITE_OTHER): Payer: Medicaid Other | Admitting: Women's Health

## 2012-12-03 ENCOUNTER — Other Ambulatory Visit: Payer: Self-pay | Admitting: Women's Health

## 2012-12-03 ENCOUNTER — Encounter: Payer: Self-pay | Admitting: Women's Health

## 2012-12-03 VITALS — BP 130/56 | Wt 117.5 lb

## 2012-12-03 DIAGNOSIS — R768 Other specified abnormal immunological findings in serum: Secondary | ICD-10-CM

## 2012-12-03 DIAGNOSIS — Z3482 Encounter for supervision of other normal pregnancy, second trimester: Secondary | ICD-10-CM

## 2012-12-03 DIAGNOSIS — O09299 Supervision of pregnancy with other poor reproductive or obstetric history, unspecified trimester: Secondary | ICD-10-CM

## 2012-12-03 DIAGNOSIS — O26849 Uterine size-date discrepancy, unspecified trimester: Secondary | ICD-10-CM

## 2012-12-03 DIAGNOSIS — Z331 Pregnant state, incidental: Secondary | ICD-10-CM

## 2012-12-03 DIAGNOSIS — Z1389 Encounter for screening for other disorder: Secondary | ICD-10-CM

## 2012-12-03 LAB — POCT URINALYSIS DIPSTICK
Ketones, UA: NEGATIVE
Protein, UA: NEGATIVE

## 2012-12-03 NOTE — Progress Notes (Signed)
Denies uc's, lof, vb, urinary frequency, urgency, hesitancy, or dysuria.  No complaints.  Pt missed earlier appt for NT/IT, too far along to perform today per U/S tech. AFP today instead. Reviewed warning s/s to report.  All questions answered. F/U in 3wks for visit.

## 2012-12-03 NOTE — Progress Notes (Signed)
Limited exam d/t advanced gestational age for NT Screen, meas c/w dates @ 14w 3d, cx long and closd, bilat ovs seen, FHT@ 144 BPM, fluid wnl.

## 2012-12-03 NOTE — Progress Notes (Signed)
AFP today

## 2012-12-04 ENCOUNTER — Encounter: Payer: Self-pay | Admitting: Women's Health

## 2012-12-04 LAB — AFP, QUAD SCREEN
Age Alone: 1:1110 {titer}
Down Syndrome Scr Risk Est: 1:8900 {titer}
INH: 139.1 pg/mL
MoM for INH: 0.57
MoM for hCG: 0.6
Osb Risk: <1:54600 {titer}
Tri 18 Scr Risk Est: NEGATIVE
Trisomy 18 (Edward) Syndrome Interp.: 1:15900 {titer}
uE3 Value: 0.3 ng/mL

## 2012-12-24 ENCOUNTER — Ambulatory Visit (INDEPENDENT_AMBULATORY_CARE_PROVIDER_SITE_OTHER): Payer: Medicaid Other | Admitting: Women's Health

## 2012-12-24 ENCOUNTER — Encounter: Payer: Self-pay | Admitting: Women's Health

## 2012-12-24 VITALS — BP 122/76 | Wt 121.0 lb

## 2012-12-24 DIAGNOSIS — Z331 Pregnant state, incidental: Secondary | ICD-10-CM

## 2012-12-24 DIAGNOSIS — Z1389 Encounter for screening for other disorder: Secondary | ICD-10-CM

## 2012-12-24 DIAGNOSIS — O98519 Other viral diseases complicating pregnancy, unspecified trimester: Secondary | ICD-10-CM

## 2012-12-24 DIAGNOSIS — O09299 Supervision of pregnancy with other poor reproductive or obstetric history, unspecified trimester: Secondary | ICD-10-CM

## 2012-12-24 DIAGNOSIS — Z3482 Encounter for supervision of other normal pregnancy, second trimester: Secondary | ICD-10-CM

## 2012-12-24 LAB — POCT URINALYSIS DIPSTICK
Nitrite, UA: NEGATIVE
Protein, UA: NEGATIVE

## 2012-12-24 NOTE — Progress Notes (Signed)
Denies uc's, lof, vb, urinary frequency, urgency, hesitancy, or dysuria.  No complaints.  Reviewed warning s/s to report.  All questions answered. F/U in 3wks for anatomy u/s, visit.

## 2012-12-24 NOTE — Progress Notes (Signed)
Pt here today for routine visit. Pt denies any problems or concerns at this time.  

## 2012-12-24 NOTE — Patient Instructions (Addendum)
Pregnancy - Second Trimester The second trimester of pregnancy (3 to 6 months) is a period of rapid growth for you and your baby. At the end of the sixth month, your baby is about 9 inches long and weighs 1 1/2 pounds. You will begin to feel the baby move between 18 and 20 weeks of the pregnancy. This is called quickening. Weight gain is faster. A clear fluid (colostrum) may leak out of your breasts. You may feel small contractions of the womb (uterus). This is known as false labor or Braxton-Hicks contractions. This is like a practice for labor when the baby is ready to be born. Usually, the problems with morning sickness have usually passed by the end of your first trimester. Some women develop small dark blotches (called cholasma, mask of pregnancy) on their face that usually goes away after the baby is born. Exposure to the sun makes the blotches worse. Acne may also develop in some pregnant women and pregnant women who have acne, may find that it goes away. PRENATAL EXAMS  Blood work may continue to be done during prenatal exams. These tests are done to check on your health and the probable health of your baby. Blood work is used to follow your blood levels (hemoglobin). Anemia (low hemoglobin) is common during pregnancy. Iron and vitamins are given to help prevent this. You will also be checked for diabetes between 24 and 28 weeks of the pregnancy. Some of the previous blood tests may be repeated.  The size of the uterus is measured during each visit. This is to make sure that the baby is continuing to grow properly according to the dates of the pregnancy.  Your blood pressure is checked every prenatal visit. This is to make sure you are not getting toxemia.  Your urine is checked to make sure you do not have an infection, diabetes or protein in the urine.  Your weight is checked often to make sure gains are happening at the suggested rate. This is to ensure that both you and your baby are  growing normally.  Sometimes, an ultrasound is performed to confirm the proper growth and development of the baby. This is a test which bounces harmless sound waves off the baby so your caregiver can more accurately determine due dates. Sometimes, a test is done on the amniotic fluid surrounding the baby. This test is called an amniocentesis. The amniotic fluid is obtained by sticking a needle into the belly (abdomen). This is done to check the chromosomes in instances where there is a concern about possible genetic problems with the baby. It is also sometimes done near the end of pregnancy if an early delivery is required. In this case, it is done to help make sure the baby's lungs are mature enough for the baby to live outside of the womb. CHANGES OCCURING IN THE SECOND TRIMESTER OF PREGNANCY Your body goes through many changes during pregnancy. They vary from person to person. Talk to your caregiver about changes you notice that you are concerned about.  During the second trimester, you will likely have an increase in your appetite. It is normal to have cravings for certain foods. This varies from person to person and pregnancy to pregnancy.  Your lower abdomen will begin to bulge.  You may have to urinate more often because the uterus and baby are pressing on your bladder. It is also common to get more bladder infections during pregnancy. You can help this by drinking lots of fluids   and emptying your bladder before and after intercourse.  You may begin to get stretch marks on your hips, abdomen, and breasts. These are normal changes in the body during pregnancy. There are no exercises or medicines to take that prevent this change.  You may begin to develop swollen and bulging veins (varicose veins) in your legs. Wearing support hose, elevating your feet for 15 minutes, 3 to 4 times a day and limiting salt in your diet helps lessen the problem.  Heartburn may develop as the uterus grows and  pushes up against the stomach. Antacids recommended by your caregiver helps with this problem. Also, eating smaller meals 4 to 5 times a day helps.  Constipation can be treated with a stool softener or adding bulk to your diet. Drinking lots of fluids, and eating vegetables, fruits, and whole grains are helpful.  Exercising is also helpful. If you have been very active up until your pregnancy, most of these activities can be continued during your pregnancy. If you have been less active, it is helpful to start an exercise program such as walking.  Hemorrhoids may develop at the end of the second trimester. Warm sitz baths and hemorrhoid cream recommended by your caregiver helps hemorrhoid problems.  Backaches may develop during this time of your pregnancy. Avoid heavy lifting, wear low heal shoes, and practice good posture to help with backache problems.  Some pregnant women develop tingling and numbness of their hand and fingers because of swelling and tightening of ligaments in the wrist (carpel tunnel syndrome). This goes away after the baby is born.  As your breasts enlarge, you may have to get a bigger bra. Get a comfortable, cotton, support bra. Do not get a nursing bra until the last month of the pregnancy if you will be nursing the baby.  You may get a dark line from your belly button to the pubic area called the linea nigra.  You may develop rosy cheeks because of increase blood flow to the face.  You may develop spider looking lines of the face, neck, arms, and chest. These go away after the baby is born. HOME CARE INSTRUCTIONS   It is extremely important to avoid all smoking, herbs, alcohol, and unprescribed drugs during your pregnancy. These chemicals affect the formation and growth of the baby. Avoid these chemicals throughout the pregnancy to ensure the delivery of a healthy infant.  Most of your home care instructions are the same as suggested for the first trimester of your  pregnancy. Keep your caregiver's appointments. Follow your caregiver's instructions regarding medicine use, exercise, and diet.  During pregnancy, you are providing food for you and your baby. Continue to eat regular, well-balanced meals. Choose foods such as meat, fish, milk and other low fat dairy products, vegetables, fruits, and whole-grain breads and cereals. Your caregiver will tell you of the ideal weight gain.  A physical sexual relationship may be continued up until near the end of pregnancy if there are no other problems. Problems could include early (premature) leaking of amniotic fluid from the membranes, vaginal bleeding, abdominal pain, or other medical or pregnancy problems.  Exercise regularly if there are no restrictions. Check with your caregiver if you are unsure of the safety of some of your exercises. The greatest weight gain will occur in the last 2 trimesters of pregnancy. Exercise will help you:  Control your weight.  Get you in shape for labor and delivery.  Lose weight after you have the baby.  Wear   a good support or jogging bra for breast tenderness during pregnancy. This may help if worn during sleep. Pads or tissues may be used in the bra if you are leaking colostrum.  Do not use hot tubs, steam rooms or saunas throughout the pregnancy.  Wear your seat belt at all times when driving. This protects you and your baby if you are in an accident.  Avoid raw meat, uncooked cheese, cat litter boxes, and soil used by cats. These carry germs that can cause birth defects in the baby.  The second trimester is also a good time to visit your dentist for your dental health if this has not been done yet. Getting your teeth cleaned is okay. Use a soft toothbrush. Brush gently during pregnancy.  It is easier to leak urine during pregnancy. Tightening up and strengthening the pelvic muscles will help with this problem. Practice stopping your urination while you are going to the  bathroom. These are the same muscles you need to strengthen. It is also the muscles you would use as if you were trying to stop from passing gas. You can practice tightening these muscles up 10 times a set and repeating this about 3 times per day. Once you know what muscles to tighten up, do not perform these exercises during urination. It is more likely to contribute to an infection by backing up the urine.  Ask for help if you have financial, counseling, or nutritional needs during pregnancy. Your caregiver will be able to offer counseling for these needs as well as refer you for other special needs.  Your skin may become oily. If so, wash your face with mild soap, use non-greasy moisturizer and oil or cream based makeup. MEDICINES AND DRUG USE IN PREGNANCY  Take prenatal vitamins as directed. The vitamin should contain 1 milligram of folic acid. Keep all vitamins out of reach of children. Only a couple vitamins or tablets containing iron may be fatal to a baby or young child when ingested.  Avoid use of all medicines, including herbs, over-the-counter medicines, not prescribed or suggested by your caregiver. Only take over-the-counter or prescription medicines for pain, discomfort, or fever as directed by your caregiver. Do not use aspirin.  Let your caregiver also know about herbs you may be using.  Alcohol is related to a number of birth defects. This includes fetal alcohol syndrome. All alcohol, in any form, should be avoided completely. Smoking will cause low birth rate and premature babies.  Street or illegal drugs are very harmful to the baby. They are absolutely forbidden. A baby born to an addicted mother will be addicted at birth. The baby will go through the same withdrawal an adult does. SEEK MEDICAL CARE IF:  You have any concerns or worries during your pregnancy. It is better to call with your questions if you feel they cannot wait, rather than worry about them. SEEK IMMEDIATE  MEDICAL CARE IF:   An unexplained oral temperature above 102 F (38.9 C) develops, or as your caregiver suggests.  You have leaking of fluid from the vagina (birth canal). If leaking membranes are suspected, take your temperature and tell your caregiver of this when you call.  There is vaginal spotting, bleeding, or passing clots. Tell your caregiver of the amount and how many pads are used. Light spotting in pregnancy is common, especially following intercourse.  You develop a bad smelling vaginal discharge with a change in the color from clear to white.  You continue to feel   sick to your stomach (nauseated) and have no relief from remedies suggested. You vomit blood or coffee ground-like materials.  You lose more than 2 pounds of weight or gain more than 2 pounds of weight over 1 week, or as suggested by your caregiver.  You notice swelling of your face, hands, feet, or legs.  You get exposed to German measles and have never had them.  You are exposed to fifth disease or chickenpox.  You develop belly (abdominal) pain. Round ligament discomfort is a common non-cancerous (benign) cause of abdominal pain in pregnancy. Your caregiver still must evaluate you.  You develop a bad headache that does not go away.  You develop fever, diarrhea, pain with urination, or shortness of breath.  You develop visual problems, blurry, or double vision.  You fall or are in a car accident or any kind of trauma.  There is mental or physical violence at home. Document Released: 04/26/2001 Document Revised: 01/25/2012 Document Reviewed: 10/29/2008 [employer] Patient Information 2014 [employer].  

## 2013-01-15 ENCOUNTER — Other Ambulatory Visit: Payer: Self-pay | Admitting: Women's Health

## 2013-01-15 ENCOUNTER — Ambulatory Visit (INDEPENDENT_AMBULATORY_CARE_PROVIDER_SITE_OTHER): Payer: Medicaid Other | Admitting: Women's Health

## 2013-01-15 ENCOUNTER — Encounter: Payer: Self-pay | Admitting: Women's Health

## 2013-01-15 ENCOUNTER — Ambulatory Visit (INDEPENDENT_AMBULATORY_CARE_PROVIDER_SITE_OTHER): Payer: Medicaid Other

## 2013-01-15 VITALS — BP 116/58 | Wt 124.5 lb

## 2013-01-15 DIAGNOSIS — Z331 Pregnant state, incidental: Secondary | ICD-10-CM

## 2013-01-15 DIAGNOSIS — O09299 Supervision of pregnancy with other poor reproductive or obstetric history, unspecified trimester: Secondary | ICD-10-CM

## 2013-01-15 DIAGNOSIS — O98519 Other viral diseases complicating pregnancy, unspecified trimester: Secondary | ICD-10-CM

## 2013-01-15 DIAGNOSIS — Z3482 Encounter for supervision of other normal pregnancy, second trimester: Secondary | ICD-10-CM

## 2013-01-15 DIAGNOSIS — Z1389 Encounter for screening for other disorder: Secondary | ICD-10-CM

## 2013-01-15 LAB — POCT URINALYSIS DIPSTICK
Ketones, UA: NEGATIVE
Protein, UA: NEGATIVE

## 2013-01-15 NOTE — Patient Instructions (Signed)
Migraine Headache A migraine headache is an intense, throbbing pain on one or both sides of your head. A migraine can last for 30 minutes to several hours. CAUSES  The exact cause of a migraine headache is not always known. However, a migraine may be caused when nerves in the brain become irritated and release chemicals that cause inflammation. This causes pain. SYMPTOMS  Pain on one or both sides of your head.  Pulsating or throbbing pain.  Severe pain that prevents daily activities.  Pain that is aggravated by any physical activity.  Nausea, vomiting, or both.  Dizziness.  Pain with exposure to bright lights, loud noises, or activity.  General sensitivity to bright lights, loud noises, or smells. Before you get a migraine, you may get warning signs that a migraine is coming (aura). An aura may include:  Seeing flashing lights.  Seeing bright spots, halos, or zig-zag lines.  Having tunnel vision or blurred vision.  Having feelings of numbness or tingling.  Having trouble talking.  Having muscle weakness. MIGRAINE TRIGGERS  Alcohol.  Smoking.  Stress.  Menstruation.  Aged cheeses.  Foods or drinks that contain nitrates, glutamate, aspartame, or tyramine.  Lack of sleep.  Chocolate.  Caffeine.  Hunger.  Physical exertion.  Fatigue.  Medicines used to treat chest pain (nitroglycerine), birth control pills, estrogen, and some blood pressure medicines. DIAGNOSIS  A migraine headache is often diagnosed based on:  Symptoms.  Physical examination.  A CT scan or MRI of your head. TREATMENT Medicines may be given for pain and nausea. Medicines can also be given to help prevent recurrent migraines.  HOME CARE INSTRUCTIONS  Only take over-the-counter or prescription medicines for pain or discomfort as directed by your caregiver. The use of long-term narcotics is not recommended.  Lie down in a dark, quiet room when you have a migraine.  Keep a journal  to find out what may trigger your migraine headaches. For example, write down:  What you eat and drink.  How much sleep you get.  Any change to your diet or medicines.  Limit alcohol consumption.  Quit smoking if you smoke.  Get 7 to 9 hours of sleep, or as recommended by your caregiver.  Limit stress.  Keep lights dim if bright lights bother you and make your migraines worse. SEEK IMMEDIATE MEDICAL CARE IF:   Your migraine becomes severe.  You have a fever.  You have a stiff neck.  You have vision loss.  You have muscular weakness or loss of muscle control.  You start losing your balance or have trouble walking.  You feel faint or pass out.  You have severe symptoms that are different from your first symptoms. MAKE SURE YOU:   Understand these instructions.  Will watch your condition.  Will get help right away if you are not doing well or get worse. Document Released: 05/02/2005 Document Revised: 07/25/2011 Document Reviewed: 04/22/2011 [employer] Patient Information 2014 [employer].  

## 2013-01-15 NOTE — Progress Notes (Signed)
Reports good fm. Denies uc's, lof, vb, urinary frequency, urgency, hesitancy, or dysuria.  Migraines, apap helps. Reviewed migraine prevention/relief measures, ptl s/s, fm.  All questions answered. F/U in 4wks for visit.

## 2013-01-15 NOTE — Progress Notes (Signed)
U/S(20+4wks)-active fetus, meas c/w dates, cx long and closed, bilateral adnexa wnl, post gr 0 plac, no major abnl noted, female fetus

## 2013-02-13 ENCOUNTER — Encounter: Payer: Self-pay | Admitting: Obstetrics and Gynecology

## 2013-02-13 ENCOUNTER — Ambulatory Visit (INDEPENDENT_AMBULATORY_CARE_PROVIDER_SITE_OTHER): Payer: Medicaid Other | Admitting: Obstetrics and Gynecology

## 2013-02-13 VITALS — BP 110/70 | Wt 133.0 lb

## 2013-02-13 DIAGNOSIS — O98519 Other viral diseases complicating pregnancy, unspecified trimester: Secondary | ICD-10-CM

## 2013-02-13 DIAGNOSIS — Z331 Pregnant state, incidental: Secondary | ICD-10-CM

## 2013-02-13 DIAGNOSIS — Z1389 Encounter for screening for other disorder: Secondary | ICD-10-CM

## 2013-02-13 DIAGNOSIS — Z3492 Encounter for supervision of normal pregnancy, unspecified, second trimester: Secondary | ICD-10-CM

## 2013-02-13 DIAGNOSIS — O09299 Supervision of pregnancy with other poor reproductive or obstetric history, unspecified trimester: Secondary | ICD-10-CM

## 2013-02-13 DIAGNOSIS — Z3482 Encounter for supervision of other normal pregnancy, second trimester: Secondary | ICD-10-CM

## 2013-02-13 LAB — POCT URINALYSIS DIPSTICK
Blood, UA: NEGATIVE
Ketones, UA: NEGATIVE

## 2013-02-13 NOTE — Progress Notes (Signed)
Pt here today for routine visit. Pt denies any problems or concerns at this time.  

## 2013-02-13 NOTE — Progress Notes (Signed)
Discussed BC will need early Depo at 2 wk, then Nexplanon as scheduled

## 2013-02-27 ENCOUNTER — Encounter: Payer: Medicaid Other | Admitting: Adult Health

## 2013-03-04 ENCOUNTER — Encounter (INDEPENDENT_AMBULATORY_CARE_PROVIDER_SITE_OTHER): Payer: Self-pay

## 2013-03-04 ENCOUNTER — Ambulatory Visit (INDEPENDENT_AMBULATORY_CARE_PROVIDER_SITE_OTHER): Payer: Medicaid Other | Admitting: Obstetrics & Gynecology

## 2013-03-04 VITALS — BP 106/60 | Wt 136.5 lb

## 2013-03-04 DIAGNOSIS — Z331 Pregnant state, incidental: Secondary | ICD-10-CM

## 2013-03-04 DIAGNOSIS — Z3482 Encounter for supervision of other normal pregnancy, second trimester: Secondary | ICD-10-CM

## 2013-03-04 DIAGNOSIS — O09299 Supervision of pregnancy with other poor reproductive or obstetric history, unspecified trimester: Secondary | ICD-10-CM

## 2013-03-04 DIAGNOSIS — Z1389 Encounter for screening for other disorder: Secondary | ICD-10-CM

## 2013-03-04 DIAGNOSIS — O98519 Other viral diseases complicating pregnancy, unspecified trimester: Secondary | ICD-10-CM

## 2013-03-04 LAB — POCT URINALYSIS DIPSTICK
Blood, UA: 3
Glucose, UA: NEGATIVE

## 2013-03-04 NOTE — Progress Notes (Signed)
No complaints at this time.

## 2013-03-04 NOTE — Progress Notes (Signed)
Patient doing well.  No complaints today.  No vaginal bleeding, discharge, loss of fluid. Good FM. No concerns today. PE - see vitals and notes. Follow up in 1 week for Glucola.

## 2013-03-11 ENCOUNTER — Other Ambulatory Visit: Payer: Medicaid Other

## 2013-03-11 DIAGNOSIS — Z3480 Encounter for supervision of other normal pregnancy, unspecified trimester: Secondary | ICD-10-CM

## 2013-03-11 LAB — CBC
HCT: 33.6 % — ABNORMAL LOW (ref 36.0–46.0)
Hemoglobin: 11.8 g/dL — ABNORMAL LOW (ref 12.0–15.0)
MCH: 29.5 pg (ref 26.0–34.0)
MCV: 84 fL (ref 78.0–100.0)
RBC: 4 MIL/uL (ref 3.87–5.11)

## 2013-03-12 LAB — RPR

## 2013-03-12 LAB — GLUCOSE TOLERANCE, 2 HOURS W/ 1HR: Glucose, 2 hour: 93 mg/dL (ref 70–139)

## 2013-03-12 LAB — ANTIBODY SCREEN: Antibody Screen: NEGATIVE

## 2013-03-12 LAB — HIV ANTIBODY (ROUTINE TESTING W REFLEX): HIV: NONREACTIVE

## 2013-03-25 ENCOUNTER — Encounter: Payer: Self-pay | Admitting: Women's Health

## 2013-03-25 ENCOUNTER — Ambulatory Visit (INDEPENDENT_AMBULATORY_CARE_PROVIDER_SITE_OTHER): Payer: Medicaid Other | Admitting: Women's Health

## 2013-03-25 VITALS — BP 128/62 | Wt 142.0 lb

## 2013-03-25 DIAGNOSIS — Z1389 Encounter for screening for other disorder: Secondary | ICD-10-CM

## 2013-03-25 DIAGNOSIS — Z3483 Encounter for supervision of other normal pregnancy, third trimester: Secondary | ICD-10-CM

## 2013-03-25 DIAGNOSIS — Z23 Encounter for immunization: Secondary | ICD-10-CM

## 2013-03-25 DIAGNOSIS — Z331 Pregnant state, incidental: Secondary | ICD-10-CM

## 2013-03-25 DIAGNOSIS — O09299 Supervision of pregnancy with other poor reproductive or obstetric history, unspecified trimester: Secondary | ICD-10-CM

## 2013-03-25 DIAGNOSIS — O99019 Anemia complicating pregnancy, unspecified trimester: Secondary | ICD-10-CM

## 2013-03-25 DIAGNOSIS — O98519 Other viral diseases complicating pregnancy, unspecified trimester: Secondary | ICD-10-CM

## 2013-03-25 LAB — POCT URINALYSIS DIPSTICK
Ketones, UA: NEGATIVE
Leukocytes, UA: NEGATIVE
Nitrite, UA: NEGATIVE
Protein, UA: NEGATIVE

## 2013-03-25 MED ORDER — INFLUENZA VAC SPLIT QUAD 0.5 ML IM SUSP
0.5000 mL | Freq: Once | INTRAMUSCULAR | Status: AC
  Administered 2013-03-25: 0.5 mL via INTRAMUSCULAR

## 2013-03-25 NOTE — Progress Notes (Signed)
Reports good fm. Denies lof, vb, urinary frequency, urgency, hesitancy, or dysuria.  BH ~2x/day. Reviewed ptl s/s, fkc, PN2 results.  All questions answered. F/U in 2wks for visit.

## 2013-03-25 NOTE — Patient Instructions (Signed)
Third Trimester of Pregnancy  The third trimester is from week 29 through week 42, months 7 through 9. The third trimester is a time when the fetus is growing rapidly. At the end of the ninth month, the fetus is about 20 inches in length and weighs 6 10 pounds.   BODY CHANGES  Your body goes through many changes during pregnancy. The changes vary from woman to woman.    Your weight will continue to increase. You can expect to gain 25 35 pounds (11 16 kg) by the end of the pregnancy.   You may begin to get stretch marks on your hips, abdomen, and breasts.   You may urinate more often because the fetus is moving lower into your pelvis and pressing on your bladder.   You may develop or continue to have heartburn as a result of your pregnancy.   You may develop constipation because certain hormones are causing the muscles that push waste through your intestines to slow down.   You may develop hemorrhoids or swollen, bulging veins (varicose veins).   You may have pelvic pain because of the weight gain and pregnancy hormones relaxing your joints between the bones in your pelvis. Back aches may result from over exertion of the muscles supporting your posture.   Your breasts will continue to grow and be tender. A yellow discharge may leak from your breasts called colostrum.   Your belly button may stick out.   You may feel short of breath because of your expanding uterus.   You may notice the fetus "dropping," or moving lower in your abdomen.   You may have a bloody mucus discharge. This usually occurs a few days to a week before labor begins.   Your cervix becomes thin and soft (effaced) near your due date.  WHAT TO EXPECT AT YOUR PRENATAL EXAMS   You will have prenatal exams every 2 weeks until week 36. Then, you will have weekly prenatal exams. During a routine prenatal visit:   You will be weighed to make sure you and the fetus are growing normally.   Your blood pressure is taken.   Your abdomen will be  measured to track your baby's growth.   The fetal heartbeat will be listened to.   Any test results from the previous visit will be discussed.   You may have a cervical check near your due date to see if you have effaced.  At around 36 weeks, your caregiver will check your cervix. At the same time, your caregiver will also perform a test on the secretions of the vaginal tissue. This test is to determine if a type of bacteria, Group B streptococcus, is present. Your caregiver will explain this further.  Your caregiver may ask you:   What your birth plan is.   How you are feeling.   If you are feeling the baby move.   If you have had any abnormal symptoms, such as leaking fluid, bleeding, severe headaches, or abdominal cramping.   If you have any questions.  Other tests or screenings that may be performed during your third trimester include:   Blood tests that check for low iron levels (anemia).   Fetal testing to check the health, activity level, and growth of the fetus. Testing is done if you have certain medical conditions or if there are problems during the pregnancy.  FALSE LABOR  You may feel small, irregular contractions that eventually go away. These are called Braxton Hicks contractions, or   false labor. Contractions may last for hours, days, or even weeks before true labor sets in. If contractions come at regular intervals, intensify, or become painful, it is best to be seen by your caregiver.   SIGNS OF LABOR    Menstrual-like cramps.   Contractions that are 5 minutes apart or less.   Contractions that start on the top of the uterus and spread down to the lower abdomen and back.   A sense of increased pelvic pressure or back pain.   A watery or bloody mucus discharge that comes from the vagina.  If you have any of these signs before the 37th week of pregnancy, call your caregiver right away. You need to go to the hospital to get checked immediately.  HOME CARE INSTRUCTIONS    Avoid all  smoking, herbs, alcohol, and unprescribed drugs. These chemicals affect the formation and growth of the baby.   Follow your caregiver's instructions regarding medicine use. There are medicines that are either safe or unsafe to take during pregnancy.   Exercise only as directed by your caregiver. Experiencing uterine cramps is a good sign to stop exercising.   Continue to eat regular, healthy meals.   Wear a good support bra for breast tenderness.   Do not use hot tubs, steam rooms, or saunas.   Wear your seat belt at all times when driving.   Avoid raw meat, uncooked cheese, cat litter boxes, and soil used by cats. These carry germs that can cause birth defects in the baby.   Take your prenatal vitamins.   Try taking a stool softener (if your caregiver approves) if you develop constipation. Eat more high-fiber foods, such as fresh vegetables or fruit and whole grains. Drink plenty of fluids to keep your urine clear or pale yellow.   Take warm sitz baths to soothe any pain or discomfort caused by hemorrhoids. Use hemorrhoid cream if your caregiver approves.   If you develop varicose veins, wear support hose. Elevate your feet for 15 minutes, 3 4 times a day. Limit salt in your diet.   Avoid heavy lifting, wear low heal shoes, and practice good posture.   Rest a lot with your legs elevated if you have leg cramps or low back pain.   Visit your dentist if you have not gone during your pregnancy. Use a soft toothbrush to brush your teeth and be gentle when you floss.   A sexual relationship may be continued unless your caregiver directs you otherwise.   Do not travel far distances unless it is absolutely necessary and only with the approval of your caregiver.   Take prenatal classes to understand, practice, and ask questions about the labor and delivery.   Make a trial run to the hospital.   Pack your hospital bag.   Prepare the baby's nursery.   Continue to go to all your prenatal visits as directed  by your caregiver.  SEEK MEDICAL CARE IF:   You are unsure if you are in labor or if your water has broken.   You have dizziness.   You have mild pelvic cramps, pelvic pressure, or nagging pain in your abdominal area.   You have persistent nausea, vomiting, or diarrhea.   You have a bad smelling vaginal discharge.   You have pain with urination.  SEEK IMMEDIATE MEDICAL CARE IF:    You have a fever.   You are leaking fluid from your vagina.   You have spotting or bleeding from your vagina.     You have severe abdominal cramping or pain.   You have rapid weight loss or gain.   You have shortness of breath with chest pain.   You notice sudden or extreme swelling of your face, hands, ankles, feet, or legs.   You have not felt your baby move in over an hour.   You have severe headaches that do not go away with medicine.   You have vision changes.  Document Released: 04/26/2001 Document Revised: 01/02/2013 Document Reviewed: 07/03/2012  [employer] Patient Information 2014 [employer].

## 2013-04-08 ENCOUNTER — Encounter: Payer: Self-pay | Admitting: Obstetrics and Gynecology

## 2013-04-08 ENCOUNTER — Ambulatory Visit (INDEPENDENT_AMBULATORY_CARE_PROVIDER_SITE_OTHER): Payer: Medicaid Other | Admitting: Obstetrics and Gynecology

## 2013-04-08 VITALS — BP 126/70 | Wt 144.6 lb

## 2013-04-08 DIAGNOSIS — Z1389 Encounter for screening for other disorder: Secondary | ICD-10-CM

## 2013-04-08 DIAGNOSIS — O09299 Supervision of pregnancy with other poor reproductive or obstetric history, unspecified trimester: Secondary | ICD-10-CM

## 2013-04-08 DIAGNOSIS — Z3483 Encounter for supervision of other normal pregnancy, third trimester: Secondary | ICD-10-CM

## 2013-04-08 DIAGNOSIS — O98519 Other viral diseases complicating pregnancy, unspecified trimester: Secondary | ICD-10-CM

## 2013-04-08 DIAGNOSIS — Z331 Pregnant state, incidental: Secondary | ICD-10-CM

## 2013-04-08 DIAGNOSIS — R768 Other specified abnormal immunological findings in serum: Secondary | ICD-10-CM

## 2013-04-08 DIAGNOSIS — O99019 Anemia complicating pregnancy, unspecified trimester: Secondary | ICD-10-CM

## 2013-04-08 LAB — POCT URINALYSIS DIPSTICK
Blood, UA: NEGATIVE
Nitrite, UA: NEGATIVE

## 2013-04-08 MED ORDER — ACYCLOVIR 400 MG PO TABS: 400.0000 mg | ORAL_TABLET | Freq: Two times a day (BID) | ORAL | Status: DC

## 2013-04-08 NOTE — Patient Instructions (Signed)
Begin acyclovir before next visit

## 2013-04-08 NOTE — Progress Notes (Signed)
Good FM, BC discussed. Pt wants depo ASAP after del, due to self-acknowledged noncompliance with pills.

## 2013-04-22 ENCOUNTER — Encounter: Payer: Self-pay | Admitting: Women's Health

## 2013-04-22 ENCOUNTER — Ambulatory Visit (INDEPENDENT_AMBULATORY_CARE_PROVIDER_SITE_OTHER): Payer: Medicaid Other | Admitting: Women's Health

## 2013-04-22 VITALS — BP 116/52 | Wt 145.0 lb

## 2013-04-22 DIAGNOSIS — O99019 Anemia complicating pregnancy, unspecified trimester: Secondary | ICD-10-CM

## 2013-04-22 DIAGNOSIS — R350 Frequency of micturition: Secondary | ICD-10-CM

## 2013-04-22 DIAGNOSIS — Z1389 Encounter for screening for other disorder: Secondary | ICD-10-CM

## 2013-04-22 DIAGNOSIS — Z331 Pregnant state, incidental: Secondary | ICD-10-CM

## 2013-04-22 DIAGNOSIS — O09299 Supervision of pregnancy with other poor reproductive or obstetric history, unspecified trimester: Secondary | ICD-10-CM

## 2013-04-22 DIAGNOSIS — O98519 Other viral diseases complicating pregnancy, unspecified trimester: Secondary | ICD-10-CM

## 2013-04-22 DIAGNOSIS — Z3483 Encounter for supervision of other normal pregnancy, third trimester: Secondary | ICD-10-CM

## 2013-04-22 LAB — POCT URINALYSIS DIPSTICK
Blood, UA: NEGATIVE
Glucose, UA: NEGATIVE
Nitrite, UA: NEGATIVE

## 2013-04-22 NOTE — Progress Notes (Signed)
Reports good fm. Denies uc's, lof, vb, urinary urgency, hesitancy, or dysuria.  Some frequency, will send urine cx.  Reviewed ptl s/s, fkc.  All questions answered. F/U in 2wks for visit.

## 2013-04-22 NOTE — Patient Instructions (Signed)
Preterm Labor Information Preterm labor is when labor starts at less than 37 weeks of pregnancy. The normal length of a pregnancy is 39 to 41 weeks. CAUSES Often, there is no identifiable underlying cause as to why a woman goes into preterm labor. One of the most common known causes of preterm labor is infection. Infections of the uterus, cervix, vagina, amniotic sac, bladder, kidney, or even the lungs (pneumonia) can cause labor to start. Other suspected causes of preterm labor include:   Urogenital infections, such as yeast infections and bacterial vaginosis.   Uterine abnormalities (uterine shape, uterine septum, fibroids, or bleeding from the placenta).   A cervix that has been operated on (it may fail to stay closed).   Malformations in the fetus.   Multiple gestations (twins, triplets, and so on).   Breakage of the amniotic sac.  RISK FACTORS  Having a previous history of preterm labor.   Having premature rupture of membranes (PROM).   Having a placenta that covers the opening of the cervix (placenta previa).   Having a placenta that separates from the uterus (placental abruption).   Having a cervix that is too weak to hold the fetus in the uterus (incompetent cervix).   Having too much fluid in the amniotic sac (polyhydramnios).   Taking illegal drugs or smoking while pregnant.   Not gaining enough weight while pregnant.   Being younger than 18 and older than 23 years old.   Having a low socioeconomic status.   Being African American. SYMPTOMS Signs and symptoms of preterm labor include:   Menstrual-like cramps, abdominal pain, or back pain.  Uterine contractions that are regular, as frequent as six in an hour, regardless of their intensity (may be mild or painful).  Contractions that start on the top of the uterus and spread down to the lower abdomen and back.   A sense of increased pelvic pressure.   A watery or bloody mucus discharge that  comes from the vagina.  TREATMENT Depending on the length of the pregnancy and other circumstances, your health care provider may suggest bed rest. If necessary, there are medicines that can be given to stop contractions and to mature the fetal lungs. If labor happens before 34 weeks of pregnancy, a prolonged hospital stay may be recommended. Treatment depends on the condition of both you and the fetus.  WHAT SHOULD YOU DO IF YOU THINK YOU ARE IN PRETERM LABOR? Call your health care provider right away. You will need to go to the hospital to get checked immediately. HOW CAN YOU PREVENT PRETERM LABOR IN FUTURE PREGNANCIES? You should:   Stop smoking if you smoke.  Maintain healthy weight gain and avoid chemicals and drugs that are not necessary.  Be watchful for any type of infection.  Inform your health care provider if you have a known history of preterm labor. Document Released: 07/23/2003 Document Revised: 01/02/2013 Document Reviewed: 06/04/2012 ExitCare Patient Information 2014 [employer].    

## 2013-04-24 LAB — URINE CULTURE: Colony Count: NO GROWTH

## 2013-05-06 ENCOUNTER — Encounter: Payer: Self-pay | Admitting: Advanced Practice Midwife

## 2013-05-06 ENCOUNTER — Ambulatory Visit (INDEPENDENT_AMBULATORY_CARE_PROVIDER_SITE_OTHER): Payer: Medicaid Other | Admitting: Advanced Practice Midwife

## 2013-05-06 VITALS — BP 128/70 | Wt 147.0 lb

## 2013-05-06 DIAGNOSIS — O99019 Anemia complicating pregnancy, unspecified trimester: Secondary | ICD-10-CM

## 2013-05-06 DIAGNOSIS — Z1389 Encounter for screening for other disorder: Secondary | ICD-10-CM

## 2013-05-06 DIAGNOSIS — O98519 Other viral diseases complicating pregnancy, unspecified trimester: Secondary | ICD-10-CM

## 2013-05-06 DIAGNOSIS — O09299 Supervision of pregnancy with other poor reproductive or obstetric history, unspecified trimester: Secondary | ICD-10-CM

## 2013-05-06 DIAGNOSIS — Z3480 Encounter for supervision of other normal pregnancy, unspecified trimester: Secondary | ICD-10-CM

## 2013-05-06 DIAGNOSIS — Z331 Pregnant state, incidental: Secondary | ICD-10-CM

## 2013-05-06 LAB — POCT URINALYSIS DIPSTICK: Glucose, UA: NEGATIVE

## 2013-05-06 LAB — OB RESULTS CONSOLE GC/CHLAMYDIA
Chlamydia: NEGATIVE
GC PROBE AMP, GENITAL: NEGATIVE

## 2013-05-06 NOTE — Progress Notes (Signed)
No c/o at this time. GBS/GC/CHL collected.  Routine questions about pregnancy answered.  F/U in 1 weeks for LROB.

## 2013-05-07 LAB — GC/CHLAMYDIA PROBE AMP: GC Probe RNA: NEGATIVE

## 2013-05-08 LAB — STREP B DNA PROBE: GBSP: NEGATIVE

## 2013-05-13 ENCOUNTER — Ambulatory Visit (INDEPENDENT_AMBULATORY_CARE_PROVIDER_SITE_OTHER): Payer: Medicaid Other | Admitting: Obstetrics and Gynecology

## 2013-05-13 VITALS — BP 130/80 | Temp 98.3°F | Wt 153.6 lb

## 2013-05-13 DIAGNOSIS — O99019 Anemia complicating pregnancy, unspecified trimester: Secondary | ICD-10-CM

## 2013-05-13 DIAGNOSIS — O09299 Supervision of pregnancy with other poor reproductive or obstetric history, unspecified trimester: Secondary | ICD-10-CM

## 2013-05-13 DIAGNOSIS — Z3483 Encounter for supervision of other normal pregnancy, third trimester: Secondary | ICD-10-CM

## 2013-05-13 DIAGNOSIS — O98519 Other viral diseases complicating pregnancy, unspecified trimester: Secondary | ICD-10-CM

## 2013-05-13 DIAGNOSIS — Z1389 Encounter for screening for other disorder: Secondary | ICD-10-CM

## 2013-05-13 DIAGNOSIS — Z331 Pregnant state, incidental: Secondary | ICD-10-CM

## 2013-05-13 LAB — POCT URINALYSIS DIPSTICK
Blood, UA: NEGATIVE
Glucose, UA: NEGATIVE
Leukocytes, UA: NEGATIVE
Nitrite, UA: NEGATIVE

## 2013-05-13 NOTE — Progress Notes (Signed)
Good fm, no  Bleeding. Pressure sx.

## 2013-05-16 NOTE — L&D Delivery Note (Signed)
Delivery Note At 10:24 AM a viable female was delivered via Vaginal, Spontaneous Delivery (Presentation: Right Occiput Anterior).  APGAR: some cry at abodmen, sent to warmer for additional stem. Pending final Apgars weight .   Placenta status: Intact, Spontaneous.  Cord: 3 vessels with the following complications: .  Cord pH: not collected  Anesthesia: Epidural  Episiotomy: None Lacerations: None Suture Repair: NA Est. Blood Loss (mL): 300  Mom to postpartum.  Baby to Couplet care / Skin to Skin when cleared by Resus team.  .NSVD over intact perineum with active amangmetn of 3rd stage with traction. Pit given after placenta out. No tears, hemostatic, EBL 300. Counts correct. Infant initially to warmer with good saturations on RA and mildly elevated HR. Infant left in  Care of nursing.   Tawana ScaleODOM, MICHAEL RYAN 05/31/2013, 10:34 AM

## 2013-05-21 ENCOUNTER — Encounter: Payer: Self-pay | Admitting: Women's Health

## 2013-05-21 ENCOUNTER — Ambulatory Visit (INDEPENDENT_AMBULATORY_CARE_PROVIDER_SITE_OTHER): Payer: Medicaid Other | Admitting: Women's Health

## 2013-05-21 VITALS — BP 120/80 | Temp 97.8°F | Wt 147.5 lb

## 2013-05-21 DIAGNOSIS — O98519 Other viral diseases complicating pregnancy, unspecified trimester: Secondary | ICD-10-CM

## 2013-05-21 DIAGNOSIS — Z331 Pregnant state, incidental: Secondary | ICD-10-CM

## 2013-05-21 DIAGNOSIS — Z3483 Encounter for supervision of other normal pregnancy, third trimester: Secondary | ICD-10-CM

## 2013-05-21 DIAGNOSIS — O99019 Anemia complicating pregnancy, unspecified trimester: Secondary | ICD-10-CM

## 2013-05-21 DIAGNOSIS — O09299 Supervision of pregnancy with other poor reproductive or obstetric history, unspecified trimester: Secondary | ICD-10-CM

## 2013-05-21 DIAGNOSIS — Z1389 Encounter for screening for other disorder: Secondary | ICD-10-CM

## 2013-05-21 LAB — POCT URINALYSIS DIPSTICK
Blood, UA: NEGATIVE
Glucose, UA: NEGATIVE
KETONES UA: NEGATIVE
Leukocytes, UA: NEGATIVE
Nitrite, UA: NEGATIVE
PROTEIN UA: NEGATIVE

## 2013-05-21 NOTE — Progress Notes (Signed)
Reports good fm. Denies uc's, lof, vb, urinary frequency, urgency, hesitancy, or dysuria.  Heartburn. Nasal stuffiness/allergies- hasn't tried anything- can try otc claritin or zyrtec, saline nasal spray. Reviewed heartburn prevention/relief measures, labor s/s, fkc.  All questions answered. F/U in 1wk for visit.

## 2013-05-21 NOTE — Patient Instructions (Addendum)
Claritin or zyrtec for allergies/sinuses Prilosec, Zantac, or Nexium for heartburn/reflux  Call the office or go to [facility] if:  You begin to have strong, frequent contractions  Your water breaks.  Sometimes it is a big gush of fluid, sometimes it is just a trickle that keeps getting your panties wet or running down your legs  You have vaginal bleeding.  It is normal to have a small amount of spotting if your cervix was checked.   You don't feel your baby moving like normal.  If you don't, get you something to eat and drink and lay down and focus on feeling your baby move.  You should feel at least 10 movements in 2 hours.  If you don't, you should call the office or go to [facility].   Heartburn During Pregnancy  Heartburn is a burning sensation in the chest caused by stomach acid backing up into the esophagus. Heartburn (also known as "reflux") is common in pregnancy because a certain hormone (progesterone) changes. The progesterone hormone may relax the valve that separates the esophagus from the stomach. This allows acid to go up into the esophagus, causing heartburn. Heartburn may also happen in pregnancy because the enlarging uterus pushes up on the stomach, which pushes more acid into the esophagus. This is especially true in the later stages of pregnancy. Heartburn problems usually go away after giving birth. CAUSES   The progesterone hormone.  Changing hormone levels.  The growing uterus that pushes stomach acid upward.  Large meals.  Certain foods and drinks.  Exercise.  Increased acid production. SYMPTOMS   Burning pain in the chest or lower throat.  Bitter taste in the mouth.  Coughing. DIAGNOSIS  Heartburn is typically diagnosed by your caregiver when taking a careful history of your concern. Your caregiver may order a blood test to check for a certain type of bacteria that is associated with heartburn. Sometimes, heartburn is diagnosed by prescribing  a heartburn medicine to see if the symptoms improve. It is rare in pregnancy to have a procedure called an endoscopy. This is when a tube with a light and a camera on the end is used to examine the esophagus and the stomach. TREATMENT   Your caregiver may tell you to use certain over-the-counter medicines (antacids, acid reducers) for mild heartburn.  Your caregiver may prescribe medicines to decrease stomach acid or to protect your stomach lining.  Your caregiver may recommend certain diet changes.  For severe cases, your caregiver may recommend that the head of the bed be elevated on blocks. (Sleeping with more pillows is not an effective treatment as it only changes the position of your head and does not improve the main problem of stomach acid refluxing into the esophagus.) HOME CARE INSTRUCTIONS   Take all medicines as directed by your caregiver.  Raise the head of your bed by putting blocks under the legs if instructed to by your caregiver.  Do not exercise right after eating.  Avoid eating 2 or 3 hours before bed. Do not lie down right after eating.  Eat small meals throughout the day instead of 3 large meals.  Identify foods and beverages that make your symptoms worse and avoid them. Foods you may want to avoid include:  Peppers.  Chocolate.  High-fat foods, including fried foods.  Spicy foods.  Garlic and onions.  Citrus fruits, including oranges, grapefruit, lemons, and limes.  Food containing tomatoes or tomato products.  Mint.  Carbonated and caffeinated drinks.  Vinegar. SEEK IMMEDIATE MEDICAL CARE IF:   You have severe chest pain that goes down your arm or into your jaw or neck.  You feel sweaty, dizzy, or lightheaded.  You become short of breath.  You vomit blood.  You have difficulty or pain with swallowing.  You have bloody or black, tarry stools.  You have episodes of heartburn more than 3 times a week, for more than 2 weeks. MAKE SURE  YOU:  Understand these instructions.  Will watch your condition.  Will get help right away if you are not doing well or get worse. Document Released: 04/29/2000 Document Revised: 07/25/2011 Document Reviewed: 12/19/2012 [facility] Patient Information 2014 [place], [place].

## 2013-05-28 ENCOUNTER — Encounter: Payer: Medicaid Other | Admitting: Advanced Practice Midwife

## 2013-05-29 ENCOUNTER — Encounter: Payer: Self-pay | Admitting: Advanced Practice Midwife

## 2013-05-29 ENCOUNTER — Ambulatory Visit (INDEPENDENT_AMBULATORY_CARE_PROVIDER_SITE_OTHER): Payer: Medicaid Other | Admitting: Advanced Practice Midwife

## 2013-05-29 VITALS — BP 112/62 | Wt 153.0 lb

## 2013-05-29 DIAGNOSIS — O09299 Supervision of pregnancy with other poor reproductive or obstetric history, unspecified trimester: Secondary | ICD-10-CM

## 2013-05-29 DIAGNOSIS — O99019 Anemia complicating pregnancy, unspecified trimester: Secondary | ICD-10-CM

## 2013-05-29 DIAGNOSIS — Z331 Pregnant state, incidental: Secondary | ICD-10-CM

## 2013-05-29 DIAGNOSIS — O98519 Other viral diseases complicating pregnancy, unspecified trimester: Secondary | ICD-10-CM

## 2013-05-29 DIAGNOSIS — Z1389 Encounter for screening for other disorder: Secondary | ICD-10-CM

## 2013-05-29 NOTE — Progress Notes (Signed)
Pt states she is having a lot of pressure "down there", and having some irregular contractions. Pt denies any problems or concerns at this time.

## 2013-05-29 NOTE — Progress Notes (Signed)
C/O pressure, irregular ctx.   No other c/o at this time.  Routine questions about pregnancy answered.  F/U in 1 weeks for LROB.

## 2013-05-31 ENCOUNTER — Inpatient Hospital Stay (HOSPITAL_COMMUNITY)
Admission: AD | Admit: 2013-05-31 | Discharge: 2013-06-01 | DRG: 775 | Disposition: A | Discharge status: Home / Self Care | Payer: Medicaid Other | Source: Ambulatory Visit | Attending: Obstetrics & Gynecology | Admitting: Obstetrics & Gynecology

## 2013-05-31 ENCOUNTER — Encounter (HOSPITAL_COMMUNITY): Payer: Medicaid Other | Admitting: Anesthesiology

## 2013-05-31 ENCOUNTER — Encounter (HOSPITAL_COMMUNITY): Payer: Self-pay

## 2013-05-31 ENCOUNTER — Inpatient Hospital Stay (HOSPITAL_COMMUNITY): Payer: Medicaid Other | Admitting: Anesthesiology

## 2013-05-31 DIAGNOSIS — IMO0001 Reserved for inherently not codable concepts without codable children: Secondary | ICD-10-CM

## 2013-05-31 DIAGNOSIS — O09899 Supervision of other high risk pregnancies, unspecified trimester: Secondary | ICD-10-CM

## 2013-05-31 DIAGNOSIS — Z87891 Personal history of nicotine dependence: Secondary | ICD-10-CM

## 2013-05-31 DIAGNOSIS — Z3480 Encounter for supervision of other normal pregnancy, unspecified trimester: Secondary | ICD-10-CM

## 2013-05-31 LAB — RPR: RPR Ser Ql: NONREACTIVE

## 2013-05-31 LAB — CBC
HEMATOCRIT: 36.9 % (ref 36.0–46.0)
Hemoglobin: 12.3 g/dL (ref 12.0–15.0)
MCH: 27.5 pg (ref 26.0–34.0)
MCHC: 33.3 g/dL (ref 30.0–36.0)
MCV: 82.6 fL (ref 78.0–100.0)
Platelets: 251 10*3/uL (ref 150–400)
RBC: 4.47 MIL/uL (ref 3.87–5.11)
RDW: 13.7 % (ref 11.5–15.5)
WBC: 12.1 10*3/uL — AB (ref 4.0–10.5)

## 2013-05-31 MED ORDER — WITCH HAZEL-GLYCERIN EX PADS: 1.0000 "application " | MEDICATED_PAD | CUTANEOUS | Status: DC | PRN

## 2013-05-31 MED ORDER — PHENYLEPHRINE 40 MCG/ML (10ML) SYRINGE FOR IV PUSH (FOR BLOOD PRESSURE SUPPORT): 80.0000 ug | PREFILLED_SYRINGE | INTRAVENOUS | Status: DC | PRN

## 2013-05-31 MED ORDER — SIMETHICONE 80 MG PO CHEW: 80.0000 mg | CHEWABLE_TABLET | ORAL | Status: DC | PRN

## 2013-05-31 MED ORDER — FENTANYL 2.5 MCG/ML BUPIVACAINE 1/10 % EPIDURAL INFUSION (WH - ANES)
14.0000 mL/h | INTRAMUSCULAR | Status: DC | PRN
  Administered 2013-05-31: 14 mL/h via EPIDURAL

## 2013-05-31 MED ORDER — LANOLIN HYDROUS EX OINT: TOPICAL_OINTMENT | CUTANEOUS | Status: DC | PRN

## 2013-05-31 MED ORDER — FLEET ENEMA 7-19 GM/118ML RE ENEM: 1.0000 | ENEMA | Freq: Every day | RECTAL | Status: DC | PRN

## 2013-05-31 MED ORDER — SENNOSIDES-DOCUSATE SODIUM 8.6-50 MG PO TABS
2.0000 | ORAL_TABLET | ORAL | Status: DC
  Administered 2013-06-01: 2 via ORAL
  Filled 2013-05-31: qty 2

## 2013-05-31 MED ORDER — LIDOCAINE HCL (PF) 1 % IJ SOLN: 30.0000 mL | INTRAMUSCULAR | Status: DC | PRN

## 2013-05-31 MED ORDER — IBUPROFEN 600 MG PO TABS
600.0000 mg | ORAL_TABLET | Freq: Four times a day (QID) | ORAL | Status: DC | PRN
  Administered 2013-05-31: 600 mg via ORAL
  Filled 2013-05-31: qty 1

## 2013-05-31 MED ORDER — PRENATAL MULTIVITAMIN CH
1.0000 | ORAL_TABLET | Freq: Every day | ORAL | Status: DC
  Administered 2013-05-31 – 2013-06-01 (×2): 1 via ORAL
  Filled 2013-05-31 (×2): qty 1

## 2013-05-31 MED ORDER — TETANUS-DIPHTH-ACELL PERTUSSIS 5-2.5-18.5 LF-MCG/0.5 IM SUSP
0.5000 mL | Freq: Once | INTRAMUSCULAR | Status: AC
  Administered 2013-06-01: 0.5 mL via INTRAMUSCULAR

## 2013-05-31 MED ORDER — EPHEDRINE 5 MG/ML INJ: 10.0000 mg | INTRAVENOUS | Status: DC | PRN

## 2013-05-31 MED ORDER — LIDOCAINE HCL (PF) 1 % IJ SOLN
INTRAMUSCULAR | Status: DC | PRN
  Administered 2013-05-31 (×4): 4 mL

## 2013-05-31 MED ORDER — LACTATED RINGERS IV SOLN: 500.0000 mL | Freq: Once | INTRAVENOUS | Status: DC

## 2013-05-31 MED ORDER — OXYTOCIN BOLUS FROM INFUSION: 500.0000 mL | INTRAVENOUS | Status: DC

## 2013-05-31 MED ORDER — OXYTOCIN 40 UNITS IN LACTATED RINGERS INFUSION - SIMPLE MED
INTRAVENOUS | Status: AC
  Filled 2013-05-31: qty 1000

## 2013-05-31 MED ORDER — FENTANYL 2.5 MCG/ML BUPIVACAINE 1/10 % EPIDURAL INFUSION (WH - ANES)
14.0000 mL/h | INTRAMUSCULAR | Status: DC | PRN
  Filled 2013-05-31: qty 125

## 2013-05-31 MED ORDER — OXYCODONE-ACETAMINOPHEN 5-325 MG PO TABS
1.0000 | ORAL_TABLET | ORAL | Status: DC | PRN
  Administered 2013-05-31 – 2013-06-01 (×2): 1 via ORAL
  Filled 2013-05-31 (×2): qty 1

## 2013-05-31 MED ORDER — BENZOCAINE-MENTHOL 20-0.5 % EX AERO
1.0000 "application " | INHALATION_SPRAY | CUTANEOUS | Status: DC | PRN
  Filled 2013-05-31: qty 56

## 2013-05-31 MED ORDER — EPHEDRINE 5 MG/ML INJ
10.0000 mg | INTRAVENOUS | Status: DC | PRN
  Filled 2013-05-31: qty 4

## 2013-05-31 MED ORDER — OXYTOCIN 40 UNITS IN LACTATED RINGERS INFUSION - SIMPLE MED
62.5000 mL/h | INTRAVENOUS | Status: DC
  Administered 2013-05-31: 999 mL/h via INTRAVENOUS

## 2013-05-31 MED ORDER — LACTATED RINGERS IV SOLN
500.0000 mL | Freq: Once | INTRAVENOUS | Status: AC
  Administered 2013-05-31: 500 mL via INTRAVENOUS

## 2013-05-31 MED ORDER — LACTATED RINGERS IV SOLN
INTRAVENOUS | Status: DC
  Administered 2013-05-31 (×2): via INTRAVENOUS

## 2013-05-31 MED ORDER — ZOLPIDEM TARTRATE 5 MG PO TABS: 5.0000 mg | ORAL_TABLET | Freq: Every evening | ORAL | Status: DC | PRN

## 2013-05-31 MED ORDER — LACTATED RINGERS IV SOLN: 500.0000 mL | INTRAVENOUS | Status: DC | PRN

## 2013-05-31 MED ORDER — LIDOCAINE HCL (PF) 1 % IJ SOLN
INTRAMUSCULAR | Status: AC
  Filled 2013-05-31: qty 30

## 2013-05-31 MED ORDER — ONDANSETRON HCL 4 MG/2ML IJ SOLN
4.0000 mg | Freq: Four times a day (QID) | INTRAMUSCULAR | Status: DC | PRN
  Administered 2013-05-31: 4 mg via INTRAVENOUS
  Filled 2013-05-31: qty 2

## 2013-05-31 MED ORDER — IBUPROFEN 600 MG PO TABS
600.0000 mg | ORAL_TABLET | Freq: Four times a day (QID) | ORAL | Status: DC
  Administered 2013-05-31 – 2013-06-01 (×5): 600 mg via ORAL
  Filled 2013-05-31 (×5): qty 1

## 2013-05-31 MED ORDER — ACETAMINOPHEN 325 MG PO TABS: 650.0000 mg | ORAL_TABLET | ORAL | Status: DC | PRN

## 2013-05-31 MED ORDER — CITRIC ACID-SODIUM CITRATE 334-500 MG/5ML PO SOLN: 30.0000 mL | ORAL | Status: DC | PRN

## 2013-05-31 MED ORDER — EPHEDRINE 5 MG/ML INJ
10.0000 mg | INTRAVENOUS | Status: DC | PRN
Start: 2013-05-31 — End: 2013-05-31

## 2013-05-31 MED ORDER — ONDANSETRON HCL 4 MG PO TABS
4.0000 mg | ORAL_TABLET | ORAL | Status: DC | PRN
Start: 2013-05-31 — End: 2013-06-01

## 2013-05-31 MED ORDER — OXYCODONE-ACETAMINOPHEN 5-325 MG PO TABS: 1.0000 | ORAL_TABLET | ORAL | Status: DC | PRN

## 2013-05-31 MED ORDER — ONDANSETRON HCL 4 MG/2ML IJ SOLN: 4.0000 mg | INTRAMUSCULAR | Status: DC | PRN

## 2013-05-31 MED ORDER — DIPHENHYDRAMINE HCL 50 MG/ML IJ SOLN: 12.5000 mg | INTRAMUSCULAR | Status: DC | PRN

## 2013-05-31 MED ORDER — PHENYLEPHRINE 40 MCG/ML (10ML) SYRINGE FOR IV PUSH (FOR BLOOD PRESSURE SUPPORT)
80.0000 ug | PREFILLED_SYRINGE | INTRAVENOUS | Status: DC | PRN
  Filled 2013-05-31: qty 10

## 2013-05-31 MED ORDER — DIBUCAINE 1 % RE OINT
1.0000 "application " | TOPICAL_OINTMENT | RECTAL | Status: DC | PRN
  Filled 2013-05-31: qty 28

## 2013-05-31 NOTE — H&P (Signed)
Rebecca [name] is a 23 y.o. female G3P1011 with IUP at [redacted]w[redacted]d presenting for labor. Pt states she has been having regular, every 3 minutes contractions, associated with none vaginal bleeding.  Membranes are intact, with active fetal movement.   [facility]  Prenatal History/Complications: none Past Medical History: Past Medical History  Diagnosis Date  . Migraine   . Pregnancy test-positive   . HPV (human papilloma virus) anogenital infection   . HSV-2 infection   . BV (bacterial vaginosis)   . Hx of chlamydia infection     Past Surgical History: Past Surgical History  Procedure Laterality Date  . No past surgeries      Obstetrical History: OB History   Grav Para Term Preterm Abortions TAB SAB Ect Mult Living   3 1 1  1  1   1         Social History: History   Social History  . Marital Status: Single    Spouse Name: N/A    Number of Children: N/A  . Years of Education: N/A   Social History Main Topics  . Smoking status: Former Smoker    Types: Cigarettes    Quit date: 04/24/2009  . Smokeless tobacco: Never Used  . Alcohol Use: No  . Drug Use: No     Comment: non since pregnancy  . Sexual Activity: Yes    Birth Control/ Protection: None   Other Topics Concern  . None   Social History Narrative  . None    Family History: Family History  Problem Relation Age of Onset  . Hypertension Maternal Grandmother   . Heart disease Maternal Grandfather   . Stroke Other   . Mental illness Other   . Diabetes Other     Allergies: Allergies  Allergen Reactions  . Benadryl [Diphenhydramine Hcl] Anaphylaxis and Hives  . Nyquil [Pseudoeph-Doxylamine-Dm-Apap] Anaphylaxis, Hives and Nausea And Vomiting    Prescriptions prior to admission  Medication Sig Dispense Refill  . acetaminophen (TYLENOL) 500 MG tablet Take 500 mg by mouth as needed for pain.      Marland Kitchen acyclovir (ZOVIRAX) 400 MG tablet Take 1 tablet (400 mg total) by mouth 2 (two) times  daily. Take twice daily for supression during last weeks of pregnancy  60 tablet  1  . prenatal vitamin w/FE, FA (PRENATAL 1 + 1) 27-1 MG TABS Take 1 tablet by mouth daily.  30 each  12     Review of Systems   Constitutional: Negative for fever and chills Eyes: Negative for visual disturbances Respiratory: Negative for shortness of breath, dyspnea Cardiovascular: Negative for chest pain or palpitations  Gastrointestinal: Negative for vomiting, diarrhea and constipation Genitourinary: Negative for dysuria and urgency Musculoskeletal: Negative for back pain, joint pain, myalgias  Neurological: Negative for dizziness and headaches     Blood pressure 145/78, pulse 103, temperature 98.3 F (36.8 C), temperature source Oral, last menstrual period 08/24/2012, not currently breastfeeding. General appearance: alert, cooperative and mild distress Lungs: clear to auscultation bilaterally Heart: regular rate and rhythm Abdomen: soft, non-tender; bowel sounds normal Extremities: Homans sign is negative, no sign of DVT DTR's 2+ Presentation: cephalic Fetal monitoringBaseline: 150 bpm, Variability: Good {> 6 bpm), Accelerations: 10x10 and Decelerations: Absent Uterine activity strong contractions q 2-3 minutes  Dilation: 8 Effacement (%): 80 Station: -2 Exam by:: L.Stubbs, RN   Prenatal labs: ABO, Rh: O/POS/-- (06/23 1102) Antibody: NEG (10/27 0915) Rubella:   RPR: NON REAC (10/27 0915)  HBsAg: NEGATIVE (  06/23 1102)  HIV: NON REACTIVE (10/27 0915)  GBS: NEGATIVE (12/22 1451)  1 hr Glucola84/108/93 Genetic screening  normal Anatomy US normal   Results for orders placed during the hospital encounter of 05/31/13 (from the past 24 hour(s))  CBC   Collection Time    05/31/13  3:55 AM      Result Value Range   WBC 12.1 (*) 4.0 - 10.5 K/uL   RBC 4.47  3.87 - 5.11 MIL/uL   Hemoglobin 12.3  12.0 - 15.0 g/dL   HCT 96.036.9  45.436.0 - 09.846.0 %   MCV 82.6  78.0 - 100.0 fL   MCH 27.5  26.0 -  34.0 pg   MCHC 33.3  30.0 - 36.0 g/dL   RDW 11.913.7  14.711.5 - 82.915.5 %   Platelets 251  150 - 400 K/uL    Assessment: Rebecca [name] is a 23 y.o. G3P1011 with an IUP at 10270w0d presenting for active labor  Plan: Expectant management; epidural   Rebecca [name],Rebecca [name] 05/31/2013, 4:27 AM

## 2013-05-31 NOTE — Progress Notes (Signed)
   Rebecca BougieLakendra S [name] is a 23 y.o. G3P1011 at 1629w0d  admitted for active labor  Subjective: Comfortable with epidural  Objective: BP 119/72  Pulse 106  Temp(Src) 98.4 F (36.9 C) (Oral)  Resp 20  Ht 5\' 4"  (1.626 m)  Wt 69.4 kg (153 lb)  BMI 26.25 kg/m2  SpO2 100%  LMP 08/24/2012 Total I/O In: -  Out: 225 [Urine:225]  FHT:  FHR: 130s bpm, variability: moderate,  accelerations:  Present,  decelerations:  Present variable and 1 prolonged for 2 mint UC:   regular, every 2 minutes Dilation: Lip/rim Effacement (%): 90 Station: +1;+2 Presentation: Vertex Exam by:: Rebecca Lucks. Woods, RN  AROM with clear fluid  Labs: Lab Results  Component Value Date   WBC 12.1* 05/31/2013   HGB 12.3 05/31/2013   HCT 36.9 05/31/2013   MCV 82.6 05/31/2013   PLT 251 05/31/2013    Assessment / Plan: Spontaneous labor, progressing normally  Labor: Progressing normally Fetal Wellbeing:  Category II Pain Control:  Epidural Anticipated MOD:  NSVD  Rebecca [name] 05/31/2013, 9:58 AM

## 2013-05-31 NOTE — Anesthesia Procedure Notes (Signed)
Epidural Patient location during procedure: OB Start time: 05/31/2013 4:38 AM  Staffing Performed by: anesthesiologist   Preanesthetic Checklist Completed: patient identified, site marked, surgical consent, pre-op evaluation, timeout performed, IV checked, risks and benefits discussed and monitors and equipment checked  Epidural Patient position: sitting Prep: site prepped and draped and DuraPrep Patient monitoring: continuous pulse ox and blood pressure Approach: midline Injection technique: LOR air  Needle:  Needle type: Tuohy  Needle gauge: 17 G Needle length: 9 cm and 9 Needle insertion depth: 6.5 cm Catheter type: closed end flexible Catheter size: 19 Gauge Catheter at skin depth: 11.5 cm Test dose: negative  Assessment Events: blood not aspirated, injection not painful, no injection resistance, negative IV test and no paresthesia  Additional Notes Discussed risk of headache, infection, bleeding, nerve injury and failed or incomplete block.  Patient voices understanding and wishes to proceed.  Epidural placed easily on first attempt.  No paresthesia.  Patient tolerated procedure well with no apparent complications.  Jasmine DecemberA. Natalea Sutliff, MDReason for block:procedure for pain

## 2013-05-31 NOTE — Anesthesia Postprocedure Evaluation (Signed)
  Anesthesia Post-op Note  Patient: Rebecca [name]  Procedure(s) Performed: * No procedures listed *  Patient Location: PACU and Mother/Baby  Anesthesia Type:Epidural  Level of Consciousness: awake, alert  and oriented  Airway and Oxygen Therapy: Patient Spontanous Breathing  Post-op Pain: none  Post-op Assessment: Post-op Vital signs reviewed, Patient's Cardiovascular Status Stable, No headache, No backache, No residual numbness and No residual motor weakness  Post-op Vital Signs: Reviewed and stable  Complications: No apparent anesthesia complications

## 2013-05-31 NOTE — Progress Notes (Signed)
UR chart review completed.  

## 2013-05-31 NOTE — Progress Notes (Signed)
   Rebecca [name] is a 23 y.o. G3P1011 at 5281w0d  admitted for active labor  Subjective: Comfortable with epidural  Objective: BP 130/81  Pulse 100  Temp(Src) 98.3 F (36.8 C) (Oral)  Resp 20  Ht 5\' 4"  (1.626 m)  Wt 69.4 kg (153 lb)  BMI 26.25 kg/m2  SpO2 100%  LMP 08/24/2012    FHT:  FHR: 150 bpm, variability: moderate,  accelerations:  Present,  decelerations:  Absent UC:   regular, every 2-3 minutes SVE:   9/100/0 AROM with clear fluid  Labs: Lab Results  Component Value Date   WBC 12.1* 05/31/2013   HGB 12.3 05/31/2013   HCT 36.9 05/31/2013   MCV 82.6 05/31/2013   PLT 251 05/31/2013    Assessment / Plan: Spontaneous labor, progressing normally  Labor: Progressing normally Fetal Wellbeing:  Category I Pain Control:  Epidural Anticipated MOD:  NSVD  CRESENZO-DISHMAN,Cortez Flippen 05/31/2013, 5:46 AM

## 2013-05-31 NOTE — Anesthesia Preprocedure Evaluation (Signed)
Anesthesia Evaluation  Patient identified by MRN, date of birth, ID band Patient awake    Reviewed: Allergy & Precautions, H&P , NPO status , Patient's Chart, lab work & pertinent test results, reviewed documented beta blocker date and time   History of Anesthesia Complications Negative for: history of anesthetic complications  Airway Mallampati: II TM Distance: >3 FB Neck ROM: full    Dental  (+) Teeth Intact   Pulmonary neg pulmonary ROS, former smoker,  breath sounds clear to auscultation        Cardiovascular negative cardio ROS  Rhythm:regular Rate:Normal     Neuro/Psych  Headaches, negative psych ROS   GI/Hepatic negative GI ROS, Neg liver ROS,   Endo/Other  negative endocrine ROS  Renal/GU negative Renal ROS  Female GU complaint     Musculoskeletal   Abdominal   Peds  Hematology negative hematology ROS (+)   Anesthesia Other Findings Tongue piercing - asked to remove  Reproductive/Obstetrics (+) Pregnancy                           Anesthesia Physical Anesthesia Plan  ASA: II  Anesthesia Plan: Epidural   Post-op Pain Management:    Induction:   Airway Management Planned:   Additional Equipment:   Intra-op Plan:   Post-operative Plan:   Informed Consent: I have reviewed the patients History and Physical, chart, labs and discussed the procedure including the risks, benefits and alternatives for the proposed anesthesia with the patient or authorized representative who has indicated his/her understanding and acceptance.     Plan Discussed with:   Anesthesia Plan Comments:         Anesthesia Quick Evaluation

## 2013-05-31 NOTE — Lactation Note (Signed)
This note was copied from the chart of Rebecca [name] Ferrero. Lactation Consultation Note MBU RN reports mom declines breast feeding during her stay and she might pump and bottle feed at home.  Offered DEBP, mom declines.  Charted for exclusion.  Patient Name: Rebecca [name] Trim Today's Date: 05/31/2013     Maternal Data Formula Feeding for Exclusion: Yes Reason for exclusion: Mother's choice to formula feed on admision  Feeding    LATCH Score/Interventions                      Lactation Tools Discussed/Used     Consult Status Consult Status: Complete    Shoptaw, Arvella MerlesJana Lynn 05/31/2013, 10:28 PM

## 2013-06-01 NOTE — Discharge Summary (Signed)
Obstetric Discharge Summary Reason for Admission: onset of labor Prenatal Procedures: NST and ultrasound Intrapartum Procedures: spontaneous vaginal delivery Postpartum Procedures: none Complications-Operative and Postpartum: none Hemoglobin  Date Value Range Status  05/31/2013 12.3  12.0 - 15.0 g/dL Final     HCT  Date Value Range Status  05/31/2013 36.9  36.0 - 46.0 % Final    Physical Exam:  General: alert, cooperative and no distress Lochia: appropriate Uterine Fundus: firm Incision: N/A DVT Evaluation: No evidence of DVT seen on physical exam.  Discharge Diagnoses: Term Pregnancy-delivered  Discharge Information: Date: 06/01/2013 Activity: unrestricted Diet: routine Medications: PNV Condition: stable Instructions: refer to practice specific booklet Discharge to: home   Newborn Data: Live born female  Birth Weight: 6 lb 15.3 oz (3155 g) APGAR: 7, 9  Home with mother.  Selena LesserBraimah, Tina 06/01/2013, 8:06 AM  I have seen and examined this patient and I agree with the above. Cam HaiSHAW, Vander Kueker 8:38 AM 06/01/2013

## 2013-06-01 NOTE — Discharge Instructions (Signed)
Before Baby Comes Home °Ask any questions about feeding, diapering, and baby care before you leave the hospital. Ask again if you do not understand. Ask when you need to see the doctor again. °There are several things you must have before your baby comes home. °· Infant car seat. °· Crib. °· Do not let your baby sleep in a bed with you or anyone else. °· If you do not have a bed for your baby, ask the doctor what you can use that will be safe for the baby to sleep in. °Infant feeding supplies: °· 6 to 8 bottles (8 oz. size). °· 6 to 8 nipples. °· Measuring cup. °· Measuring tablespoon. °· Bottle brush. °· Sterilizer (or use any large pan or kettle with a lid). °· Formula that contains iron. °· A way to boil and cool water. °Breastfeeding supplies: °· Breast pump. °· Nipple cream. °Clothing: °· 24 to 36 cloth diapers and waterproof diaper covers or a box of disposable diapers. You may need as many as 10 to 12 diapers per day. °· 3 onesies (other clothing will depend on the time of year and the weather). °· 3 receiving blankets. °· 3 baby pajamas or gowns. °· 3 bibs. °Bath equipment: °· Mild soap. °· Petroleum jelly. No baby oil or powder. °· Soft cloth towel and wash cloth. °· Cotton balls. °· Separate bath basin for baby. Only sponge bathe until umbilical cord and circumcision are healed. °Other supplies: °· Thermometer and bulb syringe (ask the hospital to send them home with you). Ask your doctor about how you should take your baby's temperature. °· One to two pacifiers. °Prepare for an emergency: °· Know how to get to the hospital and know where to admit your baby. °· Put all doctor numbers near your house phone and in your cell phone if you have one. °Prepare your family: °· Talk with siblings about the baby coming home and how they feel about it. °· Decide how you want to handle visitors and other family members. °· Take offers for help with the baby. You will need time to adjust. °Know when to call the doctor.   °GET HELP RIGHT AWAY IF: °· Your baby's temperature is greater than 100.4° F (38° C). °· The softspot on your baby's head starts to bulge. °· Your baby is crying with no tears or has no wet diapers for 6 hours. °· Your baby has rapid breathing. °· Your baby is not as alert. °Document Released: 04/14/2008 Document Revised: 07/25/2011 Document Reviewed: 07/22/2010 °ExitCare® Patient Information ©2014 [employer]. ° °

## 2013-06-05 ENCOUNTER — Encounter: Payer: Medicaid Other | Admitting: Obstetrics and Gynecology

## 2013-07-17 ENCOUNTER — Encounter (INDEPENDENT_AMBULATORY_CARE_PROVIDER_SITE_OTHER): Payer: Self-pay

## 2013-07-17 ENCOUNTER — Encounter: Payer: Self-pay | Admitting: Women's Health

## 2013-07-17 ENCOUNTER — Ambulatory Visit (INDEPENDENT_AMBULATORY_CARE_PROVIDER_SITE_OTHER): Payer: Medicaid Other | Admitting: Women's Health

## 2013-07-17 VITALS — BP 120/78 | Ht 64.0 in | Wt 134.0 lb

## 2013-07-17 DIAGNOSIS — Z3202 Encounter for pregnancy test, result negative: Secondary | ICD-10-CM

## 2013-07-17 DIAGNOSIS — Z3049 Encounter for surveillance of other contraceptives: Secondary | ICD-10-CM

## 2013-07-17 DIAGNOSIS — Z309 Encounter for contraceptive management, unspecified: Secondary | ICD-10-CM

## 2013-07-17 DIAGNOSIS — Z3480 Encounter for supervision of other normal pregnancy, unspecified trimester: Secondary | ICD-10-CM

## 2013-07-17 LAB — POCT URINE PREGNANCY: Preg Test, Ur: NEGATIVE

## 2013-07-17 MED ORDER — MEDROXYPROGESTERONE ACETATE 150 MG/ML IM SUSP
150.0000 mg | Freq: Once | INTRAMUSCULAR | Status: AC
Start: 2013-07-17 — End: 2013-07-17
  Administered 2013-07-17: 150 mg via INTRAMUSCULAR

## 2013-07-17 MED ORDER — MEDROXYPROGESTERONE ACETATE 150 MG/ML IM SUSP: 150.0000 mg | INTRAMUSCULAR | Status: DC

## 2013-07-17 NOTE — Progress Notes (Signed)
Patient ID: Rebecca [name], female   DOB: 1991-01-29, 23 y.o.   MRN: [id number] Depo provera 150 mg IM left deltoid given with no complication, pregnancy test resulted negative. Pt to return in 12 weeks for next injection.

## 2013-07-17 NOTE — Patient Instructions (Signed)
Medroxyprogesterone injection [Contraceptive]- Depo What is this medicine? MEDROXYPROGESTERONE (me DROX ee proe JES te rone) contraceptive injections prevent pregnancy. They provide effective birth control for 3 months. Depo-subQ Provera 104 is also used for treating pain related to endometriosis. This medicine may be used for other purposes; ask your health care provider or pharmacist if you have questions. COMMON BRAND NAME(S): Depo-Provera, Depo-subQ Provera 104 What should I tell my health care provider before I take this medicine? They need to know if you have any of these conditions: -frequently drink alcohol -asthma -blood vessel disease or a history of a blood clot in the lungs or legs -bone disease such as osteoporosis -breast cancer -diabetes -eating disorder (anorexia nervosa or bulimia) -high blood pressure -HIV infection or AIDS -kidney disease -liver disease -mental depression -migraine -seizures (convulsions) -stroke -tobacco smoker -vaginal bleeding -an unusual or allergic reaction to medroxyprogesterone, other hormones, medicines, foods, dyes, or preservatives -pregnant or trying to get pregnant -breast-feeding How should I use this medicine? Depo-Provera Contraceptive injection is given into a muscle. Depo-subQ Provera 104 injection is given under the skin. These injections are given by a health care professional. You must not be pregnant before getting an injection. The injection is usually given during the first 5 days after the start of a menstrual period or 6 weeks after delivery of a baby. Talk to your pediatrician regarding the use of this medicine in children. Special care may be needed. These injections have been used in female children who have started having menstrual periods. Overdosage: If you think you have taken too much of this medicine contact a poison control center or emergency room at once. NOTE: This medicine is only for you. Do not share this  medicine with others. What if I miss a dose? Try not to miss a dose. You must get an injection once every 3 months to maintain birth control. If you cannot keep an appointment, call and reschedule it. If you wait longer than 13 weeks between Depo-Provera contraceptive injections or longer than 14 weeks between Depo-subQ Provera 104 injections, you could get pregnant. Use another method for birth control if you miss your appointment. You may also need a pregnancy test before receiving another injection. What may interact with this medicine? Do not take this medicine with any of the following medications: -bosentan This medicine may also interact with the following medications: -aminoglutethimide -antibiotics or medicines for infections, especially rifampin, rifabutin, rifapentine, and griseofulvin -aprepitant -barbiturate medicines such as phenobarbital or primidone -bexarotene -carbamazepine -medicines for seizures like ethotoin, felbamate, oxcarbazepine, phenytoin, topiramate -modafinil -St. John's wort This list may not describe all possible interactions. Give your health care provider a list of all the medicines, herbs, non-prescription drugs, or dietary supplements you use. Also tell them if you smoke, drink alcohol, or use illegal drugs. Some items may interact with your medicine. What should I watch for while using this medicine? This drug does not protect you against HIV infection (AIDS) or other sexually transmitted diseases. Use of this product may cause you to lose calcium from your bones. Loss of calcium may cause weak bones (osteoporosis). Only use this product for more than 2 years if other forms of birth control are not right for you. The longer you use this product for birth control the more likely you will be at risk for weak bones. Ask your health care professional how you can keep strong bones. You may have a change in bleeding pattern or irregular periods. Many females stop    having periods while taking this drug. If you have received your injections on time, your chance of being pregnant is very low. If you think you may be pregnant, see your health care professional as soon as possible. Tell your health care professional if you want to get pregnant within the next year. The effect of this medicine may last a long time after you get your last injection. What side effects may I notice from receiving this medicine? Side effects that you should report to your doctor or health care professional as soon as possible: -allergic reactions like skin rash, itching or hives, swelling of the face, lips, or tongue -breast tenderness or discharge -breathing problems -changes in vision -depression -feeling faint or lightheaded, falls -fever -pain in the abdomen, chest, groin, or leg -problems with balance, talking, walking -unusually weak or tired -yellowing of the eyes or skin Side effects that usually do not require medical attention (report to your doctor or health care professional if they continue or are bothersome): -acne -fluid retention and swelling -headache -irregular periods, spotting, or absent periods -temporary pain, itching, or skin reaction at site where injected -weight gain This list may not describe all possible side effects. Call your doctor for medical advice about side effects. You may report side effects to FDA at 1-800-FDA-1088. Where should I keep my medicine? This does not apply. The injection will be given to you by a health care professional. NOTE: This sheet is a summary. It may not cover all possible information. If you have questions about this medicine, talk to your doctor, pharmacist, or health care provider.  2014, Elsevier/Gold Standard. (2008-05-23 18:37:56)  

## 2013-07-17 NOTE — Progress Notes (Signed)
Patient ID: Rebecca [name], female   DOB: Dec 21, 1990, 23 y.o.   MRN: [id number] Subjective:    Rebecca BougieLakendra S Bitton is a 23 y.o. 473P2012 African American female who presents for a postpartum visit. She is 6 weeks postpartum following a spontaneous vaginal delivery at 40 gestational weeks. Anesthesia: epidural. I have fully reviewed the prenatal and intrapartum course. Postpartum course has been uncomplicated. Baby's course has been uncomplicated. Baby is feeding by bottle. Bleeding bled x few weeks, lightened up and stopped for few days, then started back heavy like a period after sex around 2/20- likely on menses.  Bowel function is normal. Bladder function is normal. Patient is sexually active. Last sexual activity: ~2/20, definitely not after per her report. Contraception method is condoms and desires depo. Postpartum depression screening: negative. Score 5.  Last pap 11/05/12 and was neg.  The following portions of the patient's history were reviewed and updated as appropriate: allergies, current medications, past medical history, past surgical history and problem list.  Review of Systems Pertinent items are noted in HPI.   Filed Vitals:   07/17/13 1412  BP: 120/78  Height: 5\' 4"  (1.626 m)  Weight: 134 lb (60.782 kg)    Objective:     General:  alert, cooperative and no distress   Breasts:  deferred, no complaints  Lungs: clear to auscultation bilaterally  Heart:  regular rate and rhythm  Abdomen: soft, nontender   Vulva: normal  Vagina: normal vagina  Cervix:  closed  Corpus: Well-involuted  Adnexa:  Non-palpable  Rectal Exam: No hemorrhoids        Results for orders placed in visit on 07/17/13 (from the past 24 hour(s))  POCT URINE PREGNANCY     Status: None   Collection Time    07/17/13  2:16 PM      Result Value Ref Range   Preg Test, Ur Negative      Assessment:   Postpartum exam 6 wks s/p SVD Depression screening Contraception counseling   Plan:   Contraception:  condoms and desires depo, rx w/ 3RF Follow up this afternoon or am for depo   Marge DuncansBooker, Clerence Gubser Randall CNM, [facility]NP-BC 07/17/2013 2:37 PM

## 2013-07-18 ENCOUNTER — Ambulatory Visit: Payer: Medicaid Other

## 2013-10-09 ENCOUNTER — Ambulatory Visit: Payer: Medicaid Other

## 2014-03-17 ENCOUNTER — Encounter: Payer: Self-pay | Admitting: Women's Health

## 2015-12-13 ENCOUNTER — Emergency Department (HOSPITAL_COMMUNITY)
Admission: EM | Admit: 2015-12-13 | Discharge: 2015-12-13 | Disposition: A | Discharge status: Home / Self Care | Payer: Self-pay | Attending: Emergency Medicine | Admitting: Emergency Medicine

## 2015-12-13 ENCOUNTER — Encounter (HOSPITAL_COMMUNITY): Payer: Self-pay | Admitting: Emergency Medicine

## 2015-12-13 ENCOUNTER — Emergency Department (HOSPITAL_COMMUNITY): Payer: Self-pay

## 2015-12-13 DIAGNOSIS — W19XXXA Unspecified fall, initial encounter: Secondary | ICD-10-CM | POA: Insufficient documentation

## 2015-12-13 DIAGNOSIS — G43809 Other migraine, not intractable, without status migrainosus: Secondary | ICD-10-CM | POA: Insufficient documentation

## 2015-12-13 DIAGNOSIS — Y939 Activity, unspecified: Secondary | ICD-10-CM | POA: Insufficient documentation

## 2015-12-13 DIAGNOSIS — Z79899 Other long term (current) drug therapy: Secondary | ICD-10-CM | POA: Insufficient documentation

## 2015-12-13 DIAGNOSIS — Y999 Unspecified external cause status: Secondary | ICD-10-CM | POA: Insufficient documentation

## 2015-12-13 DIAGNOSIS — M25561 Pain in right knee: Secondary | ICD-10-CM | POA: Insufficient documentation

## 2015-12-13 DIAGNOSIS — Y929 Unspecified place or not applicable: Secondary | ICD-10-CM | POA: Insufficient documentation

## 2015-12-13 DIAGNOSIS — F17210 Nicotine dependence, cigarettes, uncomplicated: Secondary | ICD-10-CM | POA: Insufficient documentation

## 2015-12-13 LAB — BASIC METABOLIC PANEL
ANION GAP: 4 — AB (ref 5–15)
BUN: 12 mg/dL (ref 6–20)
CO2: 24 mmol/L (ref 22–32)
Calcium: 9.3 mg/dL (ref 8.9–10.3)
Chloride: 113 mmol/L — ABNORMAL HIGH (ref 101–111)
Creatinine, Ser: 0.56 mg/dL (ref 0.44–1.00)
GFR calc Af Amer: >60 mL/min (ref >60)
GFR calc non Af Amer: >60 mL/min (ref >60)
GLUCOSE: 101 mg/dL — AB (ref 65–99)
POTASSIUM: 3.1 mmol/L — AB (ref 3.5–5.1)
Sodium: 141 mmol/L (ref 135–145)

## 2015-12-13 LAB — CBC
HCT: 37.1 % (ref 36.0–46.0)
Hemoglobin: 12.4 g/dL (ref 12.0–15.0)
MCH: 27.1 pg (ref 26.0–34.0)
MCHC: 33.4 g/dL (ref 30.0–36.0)
MCV: 81.2 fL (ref 78.0–100.0)
PLATELETS: 332 10*3/uL (ref 150–400)
RBC: 4.57 MIL/uL (ref 3.87–5.11)
RDW: 12.9 % (ref 11.5–15.5)
WBC: 6.4 10*3/uL (ref 4.0–10.5)

## 2015-12-13 MED ORDER — PROCHLORPERAZINE EDISYLATE 5 MG/ML IJ SOLN
10.0000 mg | Freq: Once | INTRAMUSCULAR | Status: AC
  Administered 2015-12-13: 10 mg via INTRAVENOUS
  Filled 2015-12-13: qty 2

## 2015-12-13 MED ORDER — SODIUM CHLORIDE 0.9 % IV BOLUS (SEPSIS)
1000.0000 mL | Freq: Once | INTRAVENOUS | Status: AC
  Administered 2015-12-13: 1000 mL via INTRAVENOUS

## 2015-12-13 MED ORDER — KETOROLAC TROMETHAMINE 30 MG/ML IJ SOLN
30.0000 mg | Freq: Once | INTRAMUSCULAR | Status: AC
  Administered 2015-12-13: 30 mg via INTRAVENOUS
  Filled 2015-12-13: qty 1

## 2015-12-13 MED ORDER — IBUPROFEN 800 MG PO TABS: 800.0000 mg | ORAL_TABLET | Freq: Three times a day (TID) | ORAL | 0 refills | Status: DC

## 2015-12-13 NOTE — Discharge Instructions (Signed)
[facility] Doctor List ° ° ° °Edward Hawkins MD. Specialty: Pulmonary Disease Contact information: [address]  °[address]  °[place] [address]  °[phone]  ° °Margaret Simpson, MD. Specialty: Family Medicine Contact information: [address]  °[place] [address]  °[phone]  ° °Scott Luking, MD. Specialty: Family Medicine Contact information: [address]  °[address]  °[place] [address]  °[phone]  ° °Tesfaye Fanta, MD Specialty: Internal Medicine Contact information: [address]  °[place] [address]  °[phone]  ° °Zach Hall, MD. Specialty: Internal Medicine Contact information: [address]  °[place] [address]  °[phone]  ° °Angus Mcinnis, MD. Specialty: Family Medicine Contact information: [address]  °[place] [address]  °[phone]  ° °Stephen Knowlton, MD. Specialty: Family Medicine Contact information: [address]  °[address]  °[place] [address]  °[phone]  ° °Roy Fagan, MD. Specialty: Internal Medicine Contact information: [address]  °[address]  °[place] [address]  °[phone]  ° °Please obtain all of your results from medical records or have your doctors office obtain the results - share them with your doctor - you should be seen at your doctors office in the next 2 days. Call today to arrange your follow up. Take the medications as prescribed. Please review all of the medicines and only take them if you do not have an allergy to them. Please be aware that if you are taking birth control pills, taking other prescriptions, ESPECIALLY ANTIBIOTICS may make the birth control ineffective - if this is the case, either do not engage in sexual activity or use alternative methods of birth control such as condoms until you have finished the medicine and your family doctor says it is OK to restart them. If you are on a blood thinner such as COUMADIN, be aware that any other medicine that you  take may cause the coumadin to either work too much, or not enough - you should have your coumadin level rechecked in next 7 days if this is the case.  °?  °It is also a possibility that you have an allergic reaction to any of the medicines that you have been prescribed - Everybody reacts differently to medications and while MOST people have no trouble with most medicines, you may have a reaction such as nausea, vomiting, rash, swelling, shortness of breath. If this is the case, please stop taking the medicine immediately and contact your physician.  °?  °You should return to the ER if you develop severe or worsening symptoms.  ° ° °

## 2015-12-13 NOTE — ED Provider Notes (Signed)
[facility]-EMER[facility]ENCY DEPT Provider Note   CSN: [id number] Arrival date & time: 12/13/15  0930  First Provider Contact:   First MD Initiated Contact with Patient 12/13/15 1016     By signing my name below, I, Tanda Rockers, attest that this documentation has been prepared under the direction and in the presence of Eber Hong, MD. Electronically Signed: Tanda Rockers, ED Scribe. 12/13/15. 10:23 AM.  History   Chief Complaint Chief Complaint  Patient presents with  . Migraine    HPI Rebecca [name] is a 25 y.o. female with PMHx migraines (intermittently since she was 25 years old) who presents to the Emergency Department complaining of gradual onset, constant,  Left sided headache that began 2 days ago. She also complains of nausea and lightheadedness chronically. Pt has been taking Excedrin with mild relief. She mentions that last week she had a similar headache and when she was bending over and when she came back up she felt lightheaded, causing her to fall and injure her right knee. No LOC. She is also complaining of right knee pain since then. Pt states that she typically gets headaches a couple of times a week but does not know what brings them on. Pt has not seen a neurologist or had imaging done for the headaches. No risk pregnancy. No hx anemia. Denies any other associated symptoms.   Reviewed EMR, No prior work up including no imaging or lab work. Pt has not been seen in the ER for over 2 years.   The history is provided by the patient. No language interpreter was used.    Past Medical History:  Diagnosis Date  . BV (bacterial vaginosis)   . HPV (human papilloma virus) anogenital infection   . HSV-2 infection   . Hx of chlamydia infection   . Migraine   . Pregnancy test-positive     Patient Active Problem List   Diagnosis Date Noted  . 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 Supervision of other normal pregnancy 11/05/2012    Past Surgical History:  Procedure Laterality Date    . NO PAST SURGERIES      OB History    Gravida Para Term Preterm AB Living   3 2 2   1 2    SAB TAB Ectopic Multiple Live Births   1               Home Medications    Prior to Admission medications   Medication Sig Start Date End Date Taking? Authorizing Provider  acetaminophen (TYLENOL) 500 MG tablet Take 1,000 mg by mouth every 6 (six) hours as needed for mild pain or headache.    Yes Historical Provider, MD  medroxyPROGESTERone (DEPO-PROVERA) 150 MG/ML injection Inject 1 mL (150 mg total) into the muscle every 3 (three) months. 07/17/13  Yes Cheral Marker, CNM  ibuprofen (ADVIL,MOTRIN) 800 MG tablet Take 1 tablet (800 mg total) by mouth 3 (three) times daily. 12/13/15   Eber Hong, MD    Family History Family History  Problem Relation Age of Onset  . Hypertension Maternal Grandmother   . Heart disease Maternal Grandfather   . Stroke Other   . Mental illness Other   . Diabetes Other     Social History Social History  Substance Use Topics  . Smoking status: Current Some Day Smoker    Packs/day: 0.50    Types: Cigarettes  . Smokeless tobacco: Never Used  . Alcohol use Yes     Comment: occ  Allergies   Nyquil [pseudoeph-doxylamine-dm-apap]   Review of Systems Review of Systems  Musculoskeletal: Positive for arthralgias.  Neurological: Positive for headaches.  All other systems reviewed and are negative.  Physical Exam Updated Vital Signs BP 132/70 (BP Location: Right Arm)   Pulse 73   Temp 98 F (36.7 C) (Oral)   Resp 15   Ht  (1.651 m)   Wt 134 lb (60.8 kg)   LMP  (LMP Unknown)   SpO2 100%   BMI 22.30 kg/m   Physical Exam  Constitutional: She appears well-developed and well-nourished. No distress.  HENT:  Head: Normocephalic and atraumatic.  Mouth/Throat: Oropharynx is clear and moist. No oropharyngeal exudate.  Eyes: Conjunctivae and EOM are normal. Pupils are equal, round, and reactive to light. Right eye exhibits no discharge.  Left eye exhibits no discharge. No scleral icterus.  Neck: Normal range of motion. Neck supple. No JVD present. No thyromegaly present.  Cardiovascular: Normal rate, regular rhythm, normal heart sounds and intact distal pulses.  Exam reveals no gallop and no friction rub.   No murmur heard. Pulmonary/Chest: Effort normal and breath sounds normal. No respiratory distress. She has no wheezes. She has no rales.  Abdominal: Soft. Bowel sounds are normal. She exhibits no distension and no mass. There is no tenderness.  Musculoskeletal: Normal range of motion. She exhibits no edema or tenderness.  Lymphadenopathy:    She has no cervical adenopathy.  Neurological: She is alert. Coordination normal.  Neurologic exam:  Speech clear, pupils equal round reactive to light, extraocular movements intact  Normal peripheral visual fields Cranial nerves III through XII normal including no facial droop Follows commands, moves all extremities x4, normal strength to bilateral upper and lower extremities at all major muscle groups including grip Sensation normal to light touch and pinprick Coordination intact, no limb ataxia, finger-nose-finger normal Rapid alternating movements normal No pronator drift Gait normal   Skin: Skin is warm and dry. No rash noted. No erythema.  Psychiatric: She has a normal mood and affect. Her behavior is normal.  Nursing note and vitals reviewed.    ED Treatments / Results    DIAGNOSTIC STUDIES: Oxygen Saturation is 100% on RA, normal by my interpretation.    COORDINATION OF CARE: 10:20 AM-Discussed treatment plan which includes CT Head with pt at bedside and pt agreed to plan.   Labs (all labs ordered are listed, but only abnormal results are displayed) Labs Reviewed  BASIC METABOLIC PANEL - Abnormal; Notable for the following:       Result Value   Potassium 3.1 (*)    Chloride 113 (*)    Glucose, Bld 101 (*)    Anion gap 4 (*)    All other components within  normal limits  CBC  I-STAT BETA HCG BLOOD, ED ([facility], [facility], [facility] ONLY)    Radiology Ct Head Wo Contrast  Result Date: 12/13/2015 CLINICAL DATA:  Left side headache for 2 days. Light sensitivity, blurred vision. EXAM: CT HEAD WITHOUT CONTRAST TECHNIQUE: Contiguous axial images were obtained from the base of the skull through the vertex without intravenous contrast. COMPARISON:  12/30/2005 FINDINGS: No acute intracranial abnormality. Specifically, no hemorrhage, hydrocephalus, mass lesion, acute infarction, or significant intracranial injury. No acute calvarial abnormality. Visualized paranasal sinuses and mastoids clear. Orbital soft tissues unremarkable. IMPRESSION: Normal study. Electronically Signed   By: Charlett Nose M.D.   On: 12/13/2015 11:36   Procedures Procedures (including critical care time)  Medications Ordered in ED Medications  ketorolac (  TORADOL) 30 MG/ML injection 30 mg (30 mg Intravenous Given 12/13/15 1035)  prochlorperazine (COMPAZINE) injection 10 mg (10 mg Intravenous Given 12/13/15 1035)  sodium chloride 0.9 % bolus 1,000 mL (1,000 mLs Intravenous New Bag/Given 12/13/15 1035)     Initial Impression / Assessment and Plan / ED Course  I have reviewed the triage vital signs and the nursing notes.  Pertinent labs & imaging results that were available during my care of the patient were reviewed by me and considered in my medical decision making (see chart for details).  Clinical Course    I personally performed the services described in this documentation, which was scribed in my presence. The recorded information has been reviewed and is accurate.   Migraine gone after meds, CT neg, labs without anemia  - mild hypoK, informed, pt stable for d/c.     Final Clinical Impressions(s) / ED Diagnoses   Final diagnoses:  Other migraine without status migrainosus, not intractable    New Prescriptions New Prescriptions   IBUPROFEN (ADVIL,MOTRIN) 800 MG TABLET    Take 1 tablet  (800 mg total) by mouth 3 (three) times daily.     Eber Hong, MD 12/13/15 1250

## 2015-12-13 NOTE — ED Triage Notes (Signed)
Pt states she has chronic migraines and a few days ago her vision was blurry and she fell and injured right knee.  States her knee is still hurting, but her migraine returned this morning with blurred vision so she wants both checked.

## 2015-12-13 NOTE — ED Notes (Signed)
Pt transported to xray/CT/MRI 

## 2015-12-29 ENCOUNTER — Encounter (HOSPITAL_COMMUNITY): Payer: Self-pay | Admitting: *Deleted

## 2015-12-29 ENCOUNTER — Emergency Department (HOSPITAL_COMMUNITY): Payer: Self-pay

## 2015-12-29 ENCOUNTER — Emergency Department (HOSPITAL_COMMUNITY)
Admission: EM | Admit: 2015-12-29 | Discharge: 2015-12-29 | Disposition: A | Discharge status: Home / Self Care | Payer: Self-pay | Attending: Emergency Medicine | Admitting: Emergency Medicine

## 2015-12-29 DIAGNOSIS — Y999 Unspecified external cause status: Secondary | ICD-10-CM | POA: Insufficient documentation

## 2015-12-29 DIAGNOSIS — F17210 Nicotine dependence, cigarettes, uncomplicated: Secondary | ICD-10-CM | POA: Insufficient documentation

## 2015-12-29 DIAGNOSIS — X58XXXA Exposure to other specified factors, initial encounter: Secondary | ICD-10-CM | POA: Insufficient documentation

## 2015-12-29 DIAGNOSIS — Z79899 Other long term (current) drug therapy: Secondary | ICD-10-CM | POA: Insufficient documentation

## 2015-12-29 DIAGNOSIS — S8391XA Sprain of unspecified site of right knee, initial encounter: Secondary | ICD-10-CM | POA: Insufficient documentation

## 2015-12-29 DIAGNOSIS — Y9317 Activity, water skiing and wake boarding: Secondary | ICD-10-CM | POA: Insufficient documentation

## 2015-12-29 DIAGNOSIS — Y929 Unspecified place or not applicable: Secondary | ICD-10-CM | POA: Insufficient documentation

## 2015-12-29 DIAGNOSIS — Z791 Long term (current) use of non-steroidal anti-inflammatories (NSAID): Secondary | ICD-10-CM | POA: Insufficient documentation

## 2015-12-29 MED ORDER — DICLOFENAC SODIUM 75 MG PO TBEC: 75.0000 mg | DELAYED_RELEASE_TABLET | Freq: Two times a day (BID) | ORAL | 0 refills | Status: DC

## 2015-12-29 NOTE — Discharge Instructions (Signed)
Apply ice packs on/off to your knee when possible.  Wear the brace as needed, but not continually.  Call the orthopedic doctor listed in one week if not improving

## 2015-12-29 NOTE — ED Triage Notes (Signed)
Pt comes in with chronic right knee pain. She states this flared today when she got up. Denies any new injury.

## 2016-01-01 NOTE — ED Provider Notes (Signed)
[facility]-EMERGENCY DEPT Provider Note   CSN: [id number] Arrival date & time: 12/29/15  1718     History   Chief Complaint Chief Complaint  Patient presents with  . Knee Pain    HPI Rebecca [name] is a 25 y.o. female.  HPI  Rebecca [name] is a 25 y.o. female who presents to the Emergency Department complaining of worsening of her chronic right knee pain.  Pain worsened upon waking on the day of arrival.  She states that her right kneecap spontaneously dislocated frequently.  She reports pain pain with bending and weight bearing.  She has not tried any medications.  She denies fever, swelling, redness or numbness.   Past Medical History:  Diagnosis Date  . BV (bacterial vaginosis)   . HPV (human papilloma virus) anogenital infection   . HSV-2 infection   . Hx of chlamydia infection   . Migraine   . Pregnancy test-positive     Patient Active Problem List   Diagnosis Date Noted  . 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 Supervision of other normal pregnancy 11/05/2012    Past Surgical History:  Procedure Laterality Date  . NO PAST SURGERIES      OB History    Gravida Para Term Preterm AB Living   3 2 2   1 2    SAB TAB Ectopic Multiple Live Births   1       2       Home Medications    Prior to Admission medications   Medication Sig Start Date End Date Taking? Authorizing Provider  acetaminophen (TYLENOL) 500 MG tablet Take 1,000 mg by mouth every 6 (six) hours as needed for mild pain or headache.     Historical Provider, MD  diclofenac (VOLTAREN) 75 MG EC tablet Take 1 tablet (75 mg total) by mouth 2 (two) times daily. Take with food 12/29/15   Sani Madariaga, PA-C  ibuprofen (ADVIL,MOTRIN) 800 MG tablet Take 1 tablet (800 mg total) by mouth 3 (three) times daily. 12/13/15   Eber HongBrian Miller, MD  medroxyPROGESTERone (DEPO-PROVERA) 150 MG/ML injection Inject 1 mL (150 mg total) into the muscle every 3 (three) months. 07/17/13   Cheral MarkerKimberly R Booker, CNM    Family  History Family History  Problem Relation Age of Onset  . Hypertension Maternal Grandmother   . Heart disease Maternal Grandfather   . Stroke Other   . Mental illness Other   . Diabetes Other     Social History Social History  Substance Use Topics  . Smoking status: Current Some Day Smoker    Packs/day: 0.50    Types: Cigarettes  . Smokeless tobacco: Never Used  . Alcohol use Yes     Comment: occ     Allergies   Nyquil [pseudoeph-doxylamine-dm-apap]   Review of Systems Review of Systems  Constitutional: Negative for chills and fever.  Genitourinary: Negative for difficulty urinating and dysuria.  Musculoskeletal: Positive for arthralgias (right knee pain). Negative for joint swelling and neck pain.  Skin: Negative for color change and wound.  All other systems reviewed and are negative.    Physical Exam Updated Vital Signs BP 148/78 (BP Location: Left Arm)   Pulse 104   Temp 98 F (36.7 C) (Oral)   Resp 16   Ht 5\' 3"  (1.6 m)   Wt 68 kg   LMP  (LMP Unknown)   SpO2 96%   BMI 26.57 kg/m   Physical Exam  Constitutional: She is oriented to person, place, and time.  She appears well-developed and well-nourished. No distress.  Cardiovascular: Normal rate, regular rhythm and intact distal pulses.   Pulmonary/Chest: Effort normal and breath sounds normal.  Musculoskeletal: She exhibits tenderness. She exhibits no edema.  ttp of the anterior right knee.  No erythema, effusion, or step-off deformity.  DP pulse brisk, distal sensation intact. Calf is soft and NT.  Neurological: She is alert and oriented to person, place, and time. She exhibits normal muscle tone. Coordination normal.  Skin: Skin is warm and dry. No erythema.  Nursing note and vitals reviewed.    ED Treatments / Results  Labs (all labs ordered are listed, but only abnormal results are displayed) Labs Reviewed - No data to display  EKG  EKG Interpretation None       Radiology Ct Head Wo  Contrast  Result Date: 12/13/2015 CLINICAL DATA:  Left side headache for 2 days. Light sensitivity, blurred vision. EXAM: CT HEAD WITHOUT CONTRAST TECHNIQUE: Contiguous axial images were obtained from the base of the skull through the vertex without intravenous contrast. COMPARISON:  12/30/2005 FINDINGS: No acute intracranial abnormality. Specifically, no hemorrhage, hydrocephalus, mass lesion, acute infarction, or significant intracranial injury. No acute calvarial abnormality. Visualized paranasal sinuses and mastoids clear. Orbital soft tissues unremarkable. IMPRESSION: Normal study. Electronically Signed   By: Charlett NoseKevin  Dover M.D.   On: 12/13/2015 11:36  Dg Knee Complete 4 Views Right  Result Date: 12/29/2015 CLINICAL DATA:  Chronic right knee pain with history of multiple dislocations per patient. EXAM: RIGHT KNEE - COMPLETE 4+ VIEW COMPARISON:  08/17/2008 FINDINGS: No evidence of fracture, dislocation, or joint effusion. No evidence of arthropathy or other focal bone abnormality. Soft tissues are unremarkable. IMPRESSION: Negative. Electronically Signed   By: Kennith CenterEric  Mansell M.D.   On: 12/29/2015 19:02     Procedures Procedures (including critical care time)  Medications Ordered in ED Medications - No data to display   Initial Impression / Assessment and Plan / ED Course  I have reviewed the triage vital signs and the nursing notes.  Pertinent labs & imaging results that were available during my care of the patient were reviewed by me and considered in my medical decision making (see chart for details).  Clinical Course   XR neg, NV intact.  Knee immobilizer ordered, but pt declined.  ACE wrap applied.  Agrees to Ortho f/u.  NO concerning sx's for septic joint.   Final Clinical Impressions(s) / ED Diagnoses   Final diagnoses:  Right knee sprain, initial encounter    New Prescriptions Discharge Medication List as of 12/29/2015  7:16 PM    START taking these medications   Details   diclofenac (VOLTAREN) 75 MG EC tablet Take 1 tablet (75 mg total) by mouth 2 (two) times daily. Take with food, Starting Tue 12/29/2015, Print         Kastin Cerda [place] Gar[place]lett, [place] JerseyPA-C 01/01/16 2203    Jacalyn LefevreJulie Haviland, MD 01/01/16 [phone]

## 2017-03-17 ENCOUNTER — Emergency Department (HOSPITAL_COMMUNITY)
Admission: EM | Admit: 2017-03-17 | Discharge: 2017-03-17 | Disposition: A | Discharge status: Home / Self Care | Payer: Self-pay | Attending: Emergency Medicine | Admitting: Emergency Medicine

## 2017-03-17 ENCOUNTER — Encounter (HOSPITAL_COMMUNITY): Payer: Self-pay | Admitting: Emergency Medicine

## 2017-03-17 DIAGNOSIS — F17210 Nicotine dependence, cigarettes, uncomplicated: Secondary | ICD-10-CM | POA: Insufficient documentation

## 2017-03-17 DIAGNOSIS — L03115 Cellulitis of right lower limb: Secondary | ICD-10-CM

## 2017-03-17 DIAGNOSIS — Z79899 Other long term (current) drug therapy: Secondary | ICD-10-CM | POA: Insufficient documentation

## 2017-03-17 DIAGNOSIS — R2241 Localized swelling, mass and lump, right lower limb: Secondary | ICD-10-CM | POA: Insufficient documentation

## 2017-03-17 DIAGNOSIS — L539 Erythematous condition, unspecified: Secondary | ICD-10-CM | POA: Insufficient documentation

## 2017-03-17 MED ORDER — CEPHALEXIN 500 MG PO CAPS: 500.0000 mg | ORAL_CAPSULE | Freq: Four times a day (QID) | ORAL | 0 refills | Status: DC

## 2017-03-17 NOTE — ED Triage Notes (Signed)
Pt c/o right thigh abscess.

## 2017-03-17 NOTE — ED Provider Notes (Signed)
[facility] EMERGENCY DEPARTMENT Provider Note   CSN: [id number] Arrival date & time: 03/17/17  4098     History   Chief Complaint Chief Complaint  Patient presents with  . Abscess    HPI Rebecca [name] is a 26 y.o. female.  HPI   Rebecca [name] is a 26yo female history of migraines who presents emergency department for evaluation of right outer thigh swelling and redness.  States that she noticed this area of swelling when she woke up this morning.  It is mildly pruritic and sore to the touch. No pain at rest, but with palpation she states it is a 3/10 in severity.  She tried getting in the shower with hot water over the leg which improved symptoms mildly.  States that she has had similar redness/itching and swelling in the past on her arm and back about a year ago which improved on its own.  No recent bug bites or travel in the woods.  Denies family members with similar symptoms.  Denies fever, joint pain, nausea/vomiting, abdominal pain, shortness of breath, wheezing, headaches, numbness, weakness.   Past Medical History:  Diagnosis Date  . BV (bacterial vaginosis)   . HPV (human papilloma virus) anogenital infection   . HSV-2 infection   . Hx of chlamydia infection   . Migraine   . Pregnancy test-positive     Patient Active Problem List   Diagnosis Date Noted  . 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 Supervision of other normal pregnancy 11/05/2012    Past Surgical History:  Procedure Laterality Date  . NO PAST SURGERIES      OB History    Gravida Para Term Preterm AB Living   3 2 2   1 2    SAB TAB Ectopic Multiple Live Births   1       2       Home Medications    Prior to Admission medications   Medication Sig Start Date End Date Taking? Authorizing Provider  acetaminophen (TYLENOL) 500 MG tablet Take 1,000 mg by mouth every 6 (six) hours as needed for mild pain or headache.     [provider]  cephALEXin (KEFLEX) 500 MG capsule Take 1 capsule (500 mg  total) by mouth 4 (four) times daily. 03/17/17   Kellie Shropshire, PA-C  diclofenac (VOLTAREN) 75 MG EC tablet Take 1 tablet (75 mg total) by mouth 2 (two) times daily. Take with food 12/29/15   Triplett, Tammy, PA-C  ibuprofen (ADVIL,MOTRIN) 800 MG tablet Take 1 tablet (800 mg total) by mouth 3 (three) times daily. 12/13/15   Eber Hong, MD  medroxyPROGESTERone (DEPO-PROVERA) 150 MG/ML injection Inject 1 mL (150 mg total) into the muscle every 3 (three) months. 07/17/13   Cheral Marker, CNM    Family History Family History  Problem Relation Age of Onset  . Hypertension Maternal Grandmother   . Heart disease Maternal Grandfather   . Stroke Other   . Mental illness Other   . Diabetes Other     Social History Social History  Substance Use Topics  . Smoking status: Current Some Day Smoker    Packs/day: 0.50    Types: Cigarettes  . Smokeless tobacco: Never Used  . Alcohol use Yes     Comment: occ     Allergies   Nyquil [pseudoeph-doxylamine-dm-apap]   Review of Systems Review of Systems  Constitutional: Negative for chills, fatigue and fever.  Respiratory: Negative for shortness of breath and wheezing.   Cardiovascular:  Negative for chest pain.  Gastrointestinal: Negative for abdominal pain, nausea and vomiting.  Musculoskeletal: Negative for arthralgias, gait problem and joint swelling.  Skin: Positive for color change (red area over thigh).  Neurological: Negative for weakness, numbness and headaches.     Physical Exam Updated Vital Signs BP (!) 128/95   Pulse 95   Temp 98.1 F (36.7 C)   Resp 18   Ht 5\' 4"  (1.626 m)   Wt 65.8 kg (145 lb)   SpO2 99%   BMI 24.89 kg/m   Physical Exam  Constitutional: She appears well-developed and well-nourished. No distress.  HENT:  Head: Normocephalic and atraumatic.  Eyes: Right eye exhibits no discharge. Left eye exhibits no discharge.  Pulmonary/Chest: Effort normal and breath sounds normal. No respiratory distress.  She has no wheezes. She has no rales.  Abdominal: Soft. Bowel sounds are normal. She exhibits no distension. There is no tenderness. There is no guarding.  Neurological: She is alert. Coordination normal.  Skin: Skin is warm and dry. Capillary refill takes less than 2 seconds. She is not diaphoretic.  Approximately 8cm well circumscribed circular area of erythema and warmth. Skin is indurated, no fluctuance palpated. It is mildly tender to the touch. See picture below.   Psychiatric: She has a normal mood and affect. Her behavior is normal.  Nursing note and vitals reviewed.      ED Treatments / Results  Labs (all labs ordered are listed, but only abnormal results are displayed) Labs Reviewed - No data to display  EKG  EKG Interpretation None       Radiology No results found.  Procedures Procedures (including critical care time)  EMERGENCY DEPARTMENT US SOFT TISSUE INTERPRETATION "Study: Limited Soft Tissue Ultrasound"  INDICATIONS: Soft tissue infection Multiple views of the body part were obtained in real-time with a multi-frequency linear probe  PERFORMED BY: Myself IMAGES ARCHIVED?: Yes SIDE:Right  BODY PART:Lower extremity INTERPRETATION:  No abcess noted and Cellulitis present     Medications Ordered in ED Medications - No data to display   Initial Impression / Assessment and Plan / ED Course  I have reviewed the triage vital signs and the nursing notes.  Pertinent labs & imaging results that were available during my care of the patient were reviewed by me and considered in my medical decision making (see chart for details).     Patient presents with right outer thigh erythema, warmth and pain which began today.  Soft-tissue ultrasound performed by myself at bedside reveals cobblestone appearance consistent with cellulitis, no underlying abscess.  She is afebrile, non-toxic appearing and vital signs stable.  Will treat with antibiotics.  Have counseled  patient to return to the emergency department if she develops fever, redness begins to streak or spread, or she develops nausea/vomiting despite antibiotic use.  She agrees and voices understanding to the plan.  Discussed this patient with Dr. Jacqulyn BathLong who also saw the patient and agrees with the above plan.   Final Clinical Impressions(s) / ED Diagnoses   Final diagnoses:  Cellulitis of right lower extremity    New Prescriptions New Prescriptions   CEPHALEXIN (KEFLEX) 500 MG CAPSULE    Take 1 capsule (500 mg total) by mouth 4 (four) times daily.     Kellie ShropshireShrosbree, Debbra Digiulio J, PA-C 03/17/17 1733    Maia PlanLong, Joshua G, MD 03/18/17 [phone]

## 2017-03-17 NOTE — Discharge Instructions (Signed)
Please take antibiotic prescribed to you four times a day for the next seven days.   Follow up at the health department if your symptoms are not improving.  Return to the ER if you get a fever, if the pain and swelling increases despite taking antibiotics or if you have any new or worsening symptoms.

## 2017-05-16 NOTE — L&D Delivery Note (Signed)
Delivery Note   [facility] Delivery Note  [name] is a 27 y.o. [id number]  at [redacted]w[redacted]d. She was admitted for IOLL due to gestational hypertension.   ROM: 3h 30m with clear fluid GBS Status: neg Maximum Maternal Temperature: n/a  Labor Progress: . normal  Delivery Date/Time: 03/01/2016@0643  Delivery: Called to room at [phone] due to repetitive deep decelerations over the past 15 minutes with pt close to delivery and patient was complete and pushing. Head delivered OA. No nuchal cord present x1. Shoulder and body delivered in usual fashion. Infant was depressed NICU stff present for rescucitation . Cord clamped x 2 after 1-minute delay, and cut by me. Cord blood drawn. Placenta delivered spontaneously with gentle cord traction. Fundus firm with massage and Pitocin. Labia, perineum, vagina, and cervix inspected inspected with no lacerations.   Placenta: intact Complications: none Lacerations: none EBL: 250  Postpartum Planning [ ]  message to sent to schedule follow-up  [ ]  vaccines UTD  Infant: female  APGARs pending  pendingg  [name], MD 03/01/2018 7:04 AM

## 2017-07-19 ENCOUNTER — Telehealth: Payer: Self-pay | Admitting: *Deleted

## 2017-07-19 NOTE — Telephone Encounter (Signed)
Discussed patient with Rebecca [name] she recommends that patient try to avoid taking any medications. Only use if absolutely necessary. Called patient back and our call was disconnected. Called back and left detailed message that we recommend that she avoid taking any cold medicines prior to 14 weeks. Recommended hydration and rest.

## 2017-07-24 ENCOUNTER — Ambulatory Visit: Payer: Medicaid Other | Admitting: Adult Health

## 2017-07-24 ENCOUNTER — Encounter: Payer: Self-pay | Admitting: Adult Health

## 2017-07-24 ENCOUNTER — Encounter (INDEPENDENT_AMBULATORY_CARE_PROVIDER_SITE_OTHER): Payer: Self-pay

## 2017-07-24 VITALS — BP 134/86 | HR 74 | Resp 16 | Ht 64.0 in | Wt 156.0 lb

## 2017-07-24 DIAGNOSIS — Z3201 Encounter for pregnancy test, result positive: Secondary | ICD-10-CM | POA: Diagnosis not present

## 2017-07-24 DIAGNOSIS — R1111 Vomiting without nausea: Secondary | ICD-10-CM

## 2017-07-24 DIAGNOSIS — O3680X0 Pregnancy with inconclusive fetal viability, not applicable or unspecified: Secondary | ICD-10-CM | POA: Insufficient documentation

## 2017-07-24 DIAGNOSIS — N926 Irregular menstruation, unspecified: Secondary | ICD-10-CM | POA: Diagnosis not present

## 2017-07-24 DIAGNOSIS — Z3A01 Less than 8 weeks gestation of pregnancy: Secondary | ICD-10-CM

## 2017-07-24 LAB — POCT URINE PREGNANCY: PREG TEST UR: POSITIVE — AB

## 2017-07-24 MED ORDER — DOXYLAMINE-PYRIDOXINE 10-10 MG PO TBEC: DELAYED_RELEASE_TABLET | ORAL | 3 refills | Status: DC

## 2017-07-24 MED ORDER — PRENATAL PLUS 27-1 MG PO TABS: 1.0000 | ORAL_TABLET | Freq: Every day | ORAL | 0 refills | Status: DC

## 2017-07-24 MED ORDER — PRENATAL PLUS 27-1 MG PO TABS: 1.0000 | ORAL_TABLET | Freq: Every day | ORAL | 12 refills | Status: DC

## 2017-07-24 NOTE — Patient Instructions (Signed)
First Trimester of Pregnancy The first trimester of pregnancy is from week 1 until the end of week 13 (months 1 through 3). A week after a sperm fertilizes an egg, the egg will implant on the wall of the uterus. This embryo will begin to develop into a baby. Genes from you and your partner will form the baby. The female genes will determine whether the baby will be a boy or a girl. At 6-8 weeks, the eyes and face will be formed, and the heartbeat can be seen on ultrasound. At the end of 12 weeks, all the baby's organs will be formed. Now that you are pregnant, you will want to do everything you can to have a healthy baby. Two of the most important things are to get good prenatal care and to follow your health care provider's instructions. Prenatal care is all the medical care you receive before the baby's birth. This care will help prevent, find, and treat any problems during the pregnancy and childbirth. Body changes during your first trimester Your body goes through many changes during pregnancy. The changes vary from woman to woman.  You may gain or lose a couple of pounds at first.  You may feel sick to your stomach (nauseous) and you may throw up (vomit). If the vomiting is uncontrollable, call your health care provider.  You may tire easily.  You may develop headaches that can be relieved by medicines. All medicines should be approved by your health care provider.  You may urinate more often. Painful urination may mean you have a bladder infection.  You may develop heartburn as a result of your pregnancy.  You may develop constipation because certain hormones are causing the muscles that push stool through your intestines to slow down.  You may develop hemorrhoids or swollen veins (varicose veins).  Your breasts may begin to grow larger and become tender. Your nipples may stick out more, and the tissue that surrounds them (areola) may become darker.  Your gums may bleed and may be  sensitive to brushing and flossing.  Dark spots or blotches (chloasma, mask of pregnancy) may develop on your face. This will likely fade after the baby is born.  Your menstrual periods will stop.  You may have a loss of appetite.  You may develop cravings for certain kinds of food.  You may have changes in your emotions from day to day, such as being excited to be pregnant or being concerned that something may go wrong with the pregnancy and baby.  You may have more vivid and strange dreams.  You may have changes in your hair. These can include thickening of your hair, rapid growth, and changes in texture. Some women also have hair loss during or after pregnancy, or hair that feels dry or thin. Your hair will most likely return to normal after your baby is born.  What to expect at prenatal visits During a routine prenatal visit:  You will be weighed to make sure you and the baby are growing normally.  Your blood pressure will be taken.  Your abdomen will be measured to track your baby's growth.  The fetal heartbeat will be listened to between weeks 10 and 14 of your pregnancy.  Test results from any previous visits will be discussed.  Your health care provider may ask you:  How you are feeling.  If you are feeling the baby move.  If you have had any abnormal symptoms, such as leaking fluid, bleeding, severe headaches,   or abdominal cramping.  If you are using any tobacco products, including cigarettes, chewing tobacco, and electronic cigarettes.  If you have any questions.  Other tests that may be performed during your first trimester include:  Blood tests to find your blood type and to check for the presence of any previous infections. The tests will also be used to check for low iron levels (anemia) and protein on red blood cells (Rh antibodies). Depending on your risk factors, or if you previously had diabetes during pregnancy, you may have tests to check for high blood  sugar that affects pregnant women (gestational diabetes).  Urine tests to check for infections, diabetes, or protein in the urine.  An ultrasound to confirm the proper growth and development of the baby.  Fetal screens for spinal cord problems (spina bifida) and Down syndrome.  HIV (human immunodeficiency virus) testing. Routine prenatal testing includes screening for HIV, unless you choose not to have this test.  You may need other tests to make sure you and the baby are doing well.  Follow these instructions at home: Medicines  Follow your health care provider's instructions regarding medicine use. Specific medicines may be either safe or unsafe to take during pregnancy.  Take a prenatal vitamin that contains at least 600 micrograms (mcg) of folic acid.  If you develop constipation, try taking a stool softener if your health care provider approves. Eating and drinking  Eat a balanced diet that includes fresh fruits and vegetables, whole grains, good sources of protein such as meat, eggs, or tofu, and low-fat dairy. Your health care provider will help you determine the amount of weight gain that is right for you.  Avoid raw meat and uncooked cheese. These carry germs that can cause birth defects in the baby.  Eating four or five small meals rather than three large meals a day may help relieve nausea and vomiting. If you start to feel nauseous, eating a few soda crackers can be helpful. Drinking liquids between meals, instead of during meals, also seems to help ease nausea and vomiting.  Limit foods that are high in fat and processed sugars, such as fried and sweet foods.  To prevent constipation: ? Eat foods that are high in fiber, such as fresh fruits and vegetables, whole grains, and beans. ? Drink enough fluid to keep your urine clear or pale yellow. Activity  Exercise only as directed by your health care provider. Most women can continue their usual exercise routine during  pregnancy. Try to exercise for 30 minutes at least 5 days a week. Exercising will help you: ? Control your weight. ? Stay in shape. ? Be prepared for labor and delivery.  Experiencing pain or cramping in the lower abdomen or lower back is a good sign that you should stop exercising. Check with your health care provider before continuing with normal exercises.  Try to avoid standing for long periods of time. Move your legs often if you must stand in one place for a long time.  Avoid heavy lifting.  Wear low-heeled shoes and practice good posture.  You may continue to have sex unless your health care provider tells you not to. Relieving pain and discomfort  Wear a good support bra to relieve breast tenderness.  Take warm sitz baths to soothe any pain or discomfort caused by hemorrhoids. Use hemorrhoid cream if your health care provider approves.  Rest with your legs elevated if you have leg cramps or low back pain.  If you develop   varicose veins in your legs, wear support hose. Elevate your feet for 15 minutes, 3-4 times a day. Limit salt in your diet. Prenatal care  Schedule your prenatal visits by the twelfth week of pregnancy. They are usually scheduled monthly at first, then more often in the last 2 months before delivery.  Write down your questions. Take them to your prenatal visits.  Keep all your prenatal visits as told by your health care provider. This is important. Safety  Wear your seat belt at all times when driving.  Make a list of emergency phone numbers, including numbers for family, friends, the hospital, and police and fire departments. General instructions  Ask your health care provider for a referral to a local prenatal education class. Begin classes no later than the beginning of month 6 of your pregnancy.  Ask for help if you have counseling or nutritional needs during pregnancy. Your health care provider can offer advice or refer you to specialists for help  with various needs.  Do not use hot tubs, steam rooms, or saunas.  Do not douche or use tampons or scented sanitary pads.  Do not cross your legs for long periods of time.  Avoid cat litter boxes and soil used by cats. These carry germs that can cause birth defects in the baby and possibly loss of the fetus by miscarriage or stillbirth.  Avoid all smoking, herbs, alcohol, and medicines not prescribed by your health care provider. Chemicals in these products affect the formation and growth of the baby.  Do not use any products that contain nicotine or tobacco, such as cigarettes and e-cigarettes. If you need help quitting, ask your health care provider. You may receive counseling support and other resources to help you quit.  Schedule a dentist appointment. At home, brush your teeth with a soft toothbrush and be gentle when you floss. Contact a health care provider if:  You have dizziness.  You have mild pelvic cramps, pelvic pressure, or nagging pain in the abdominal area.  You have persistent nausea, vomiting, or diarrhea.  You have a bad smelling vaginal discharge.  You have pain when you urinate.  You notice increased swelling in your face, hands, legs, or ankles.  You are exposed to fifth disease or chickenpox.  You are exposed to German measles (rubella) and have never had it. Get help right away if:  You have a fever.  You are leaking fluid from your vagina.  You have spotting or bleeding from your vagina.  You have severe abdominal cramping or pain.  You have rapid weight gain or loss.  You vomit blood or material that looks like coffee grounds.  You develop a severe headache.  You have shortness of breath.  You have any kind of trauma, such as from a fall or a car accident. Summary  The first trimester of pregnancy is from week 1 until the end of week 13 (months 1 through 3).  Your body goes through many changes during pregnancy. The changes vary from  woman to woman.  You will have routine prenatal visits. During those visits, your health care provider will examine you, discuss any test results you may have, and talk with you about how you are feeling. This information is not intended to replace advice given to you by your health care provider. Make sure you discuss any questions you have with your health care provider. Document Released: 04/26/2001 Document Revised: 04/13/2016 Document Reviewed: 04/13/2016 [employer] Interactive Patient Education  2018 [employer]  

## 2017-07-24 NOTE — Progress Notes (Signed)
Subjective:     Patient ID: Rebecca [name], female   DOB: [birth year], 27 y.o.   MRN: [id number]  HPI Rebecca [name] is a 27 year old black female in for UPT, has missed a period and had 4+HPTs, had been on depo but did not get November shot.   Review of Systems +missed period with 4+HPTs +nasuea and vomiting Reviewed past medical,surgical, social and family history. Reviewed medications and allergies.     Objective:   Physical Exam BP 134/86 (BP Location: Left Arm, Patient Position: Sitting, Cuff Size: Normal)   Pulse 74   Resp 16   Ht 5\' 4"  (1.626 m)   Wt 156 lb (70.8 kg)   LMP 06/03/2017 (Exact Date)   BMI 26.78 kg/m UPT +, about 7+2 weeks by LMP with EDD 03/10/18. Skin warm and dry. Neck: mid line trachea, normal thyroid, good ROM, no lymphadenopathy noted. Lungs: clear to ausculation bilaterally. Cardiovascular: regular rate and rhythm.Abdomen is soft and non tender.    Assessment:     1. Pregnancy test positive   2. Less than [redacted] weeks gestation of pregnancy   3. Encounter to determine fetal viability of pregnancy, single or unspecified fetus       Plan:     Meds ordered this encounter  Medications  . DISCONTD: prenatal vitamin w/FE, FA (PRENATAL 1 + 1) 27-1 MG TABS tablet    Sig: Take 1 tablet by mouth daily at 12 noon.    Dispense:  30 each    Refill:  0    Order Specific Question:   Supervising Provider    Answer:   Despina HiddenEURE, LUTHER H [[id number]]  . Doxylamine-Pyridoxine (DICLEGIS) 10-10 MG TBEC    Sig: Take 2 at hs, 1 in am and 1 in afternoon    Dispense:  100 tablet    Refill:  3    Order Specific Question:   Supervising Provider    Answer:   Despina HiddenEURE, LUTHER H [[id number]]  . prenatal vitamin w/FE, FA (PRENATAL 1 + 1) 27-1 MG TABS tablet    Sig: Take 1 tablet by mouth daily at 12 noon.    Dispense:  30 each    Refill:  12    Order Specific Question:   Supervising Provider    Answer:   Lazaro ArmsEURE, LUTHER H [[id number]]  Eat often Return in 1 week for dating US Review handouts on First  trimester and by [facility]

## 2017-07-28 ENCOUNTER — Other Ambulatory Visit: Payer: Self-pay | Admitting: Adult Health

## 2017-07-28 DIAGNOSIS — O3680X0 Pregnancy with inconclusive fetal viability, not applicable or unspecified: Secondary | ICD-10-CM

## 2017-07-31 ENCOUNTER — Ambulatory Visit (INDEPENDENT_AMBULATORY_CARE_PROVIDER_SITE_OTHER): Payer: Medicaid Other

## 2017-07-31 DIAGNOSIS — O3680X0 Pregnancy with inconclusive fetal viability, not applicable or unspecified: Secondary | ICD-10-CM

## 2017-07-31 DIAGNOSIS — Z3A08 8 weeks gestation of pregnancy: Secondary | ICD-10-CM

## 2017-07-31 NOTE — Progress Notes (Signed)
US 8+2 wks single IUP,w/ys,positive fht 148 bpm,normal ovaries bilat,crl 14.mm,EDD 03/10/2018 by LMP

## 2017-08-16 ENCOUNTER — Ambulatory Visit (INDEPENDENT_AMBULATORY_CARE_PROVIDER_SITE_OTHER): Payer: Medicaid Other | Admitting: Women's Health

## 2017-08-16 ENCOUNTER — Ambulatory Visit: Payer: Medicaid Other | Admitting: *Deleted

## 2017-08-16 ENCOUNTER — Encounter: Payer: Self-pay | Admitting: Women's Health

## 2017-08-16 ENCOUNTER — Other Ambulatory Visit (HOSPITAL_COMMUNITY)
Admission: RE | Admit: 2017-08-16 | Discharge: 2017-08-16 | Disposition: A | Discharge status: Home / Self Care | Payer: Medicaid Other | Source: Ambulatory Visit | Attending: Obstetrics & Gynecology | Admitting: Obstetrics & Gynecology

## 2017-08-16 VITALS — BP 110/70 | HR 80 | Wt 152.0 lb

## 2017-08-16 DIAGNOSIS — Z3A10 10 weeks gestation of pregnancy: Secondary | ICD-10-CM | POA: Diagnosis not present

## 2017-08-16 DIAGNOSIS — Z3481 Encounter for supervision of other normal pregnancy, first trimester: Secondary | ICD-10-CM | POA: Insufficient documentation

## 2017-08-16 DIAGNOSIS — Z1389 Encounter for screening for other disorder: Secondary | ICD-10-CM

## 2017-08-16 DIAGNOSIS — Z331 Pregnant state, incidental: Secondary | ICD-10-CM

## 2017-08-16 DIAGNOSIS — Z3490 Encounter for supervision of normal pregnancy, unspecified, unspecified trimester: Secondary | ICD-10-CM | POA: Insufficient documentation

## 2017-08-16 DIAGNOSIS — O219 Vomiting of pregnancy, unspecified: Secondary | ICD-10-CM | POA: Diagnosis not present

## 2017-08-16 DIAGNOSIS — R768 Other specified abnormal immunological findings in serum: Secondary | ICD-10-CM

## 2017-08-16 DIAGNOSIS — Z3682 Encounter for antenatal screening for nuchal translucency: Secondary | ICD-10-CM

## 2017-08-16 LAB — POCT URINALYSIS DIPSTICK
Blood, UA: NEGATIVE
Glucose, UA: NEGATIVE
KETONES UA: NEGATIVE
LEUKOCYTES UA: NEGATIVE
NITRITE UA: NEGATIVE
Protein, UA: NEGATIVE

## 2017-08-16 MED ORDER — PROMETHAZINE HCL 25 MG PO TABS: 12.5000 mg | ORAL_TABLET | Freq: Four times a day (QID) | ORAL | 0 refills | Status: DC | PRN

## 2017-08-16 MED ORDER — PANTOPRAZOLE SODIUM 20 MG PO TBEC
20.0000 mg | DELAYED_RELEASE_TABLET | Freq: Every day | ORAL | 6 refills | Status: DC
Start: 2017-08-16 — End: 2018-04-05

## 2017-08-16 NOTE — Patient Instructions (Signed)
Rebecca [name], I greatly value your feedback.  If you receive a survey following your visit with [place] today, we appreciate you taking the time to fill it out.  Thanks, Rebecca [name], CNM, WHNP-BC   Nausea & Vomiting  Have saltine crackers or pretzels by your bed and eat a few bites before you raise your head out of bed in the morning  Eat small frequent meals throughout the day instead of large meals  Drink plenty of fluids throughout the day to stay hydrated, just don't drink a lot of fluids with your meals.  This can make your stomach fill up faster making you feel sick  Do not brush your teeth right after you eat  Products with real ginger are good for nausea, like ginger ale and ginger hard candy Make sure it says made with real ginger!  Sucking on sour candy like lemon heads is also good for nausea  If your prenatal vitamins make you nauseated, take them at night so you will sleep through the nausea  Sea Bands  If you feel like you need medicine for the nausea & vomiting please let us know  If you are unable to keep any fluids or food down please let us know  For Headaches:   Stay well hydrated, drink enough water so that your urine is clear, sometimes if you are dehydrated you can get headaches  Eat small frequent meals and snacks, sometimes if you are hungry you can get headaches  Sometimes you get headaches during pregnancy from the pregnancy hormones  You can try tylenol (1-2 regular strength 325mg  or 1-2 extra strength 500mg ) as directed on the box. The least amount of medication that works is best.   Cool compresses (cool wet washcloth or ice pack) to area of head that is hurting  You can also try drinking a caffeinated drink to see if this will help  If not helping, try below:  For Prevention of Headaches/Migraines:  CoQ10 100mg  three times daily  Vitamin B2 400mg  daily  Magnesium Oxide 400-600mg  daily  If You Get a Bad Headache/Migraine:  Benadryl 25mg      Magnesium Oxide  1 large Gatorade  2 extra strength Tylenol (1,000mg  total)  1 cup coffee or Coke  If this doesn't help please call us @ [phone]     Constipation  Drink plenty of fluid, preferably water, throughout the day  Eat foods high in fiber such as fruits, vegetables, and grains  Exercise, such as walking, is a good way to keep your bowels regular  Drink warm fluids, especially warm prune juice, or decaf coffee  Eat a 1/2 cup of real oatmeal (not instant), 1/2 cup applesauce, and 1/2-1 cup warm prune juice every day  If needed, you may take Colace (docusate sodium) stool softener once or twice a day to help keep the stool soft. If you are pregnant, wait until you are out of your first trimester (12-14 weeks of pregnancy)  If you still are having problems with constipation, you may take Miralax once daily as needed to help keep your bowels regular.  If you are pregnant, wait until you are out of your first trimester (12-14 weeks of pregnancy)   First Trimester of Pregnancy The first trimester of pregnancy is from week 1 until the end of week 12 (months 1 through 3). A week after a sperm fertilizes an egg, the egg will implant on the wall of the uterus. This embryo will begin to develop into  a baby. Genes from you and your partner are forming the baby. The female genes determine whether the baby is a boy or a girl. At 6-8 weeks, the eyes and face are formed, and the heartbeat can be seen on ultrasound. At the end of 12 weeks, all the baby's organs are formed.  Now that you are pregnant, you will want to do everything you can to have a healthy baby. Two of the most important things are to get good prenatal care and to follow your health care provider's instructions. Prenatal care is all the medical care you receive before the baby's birth. This care will help prevent, find, and treat any problems during the pregnancy and childbirth. BODY CHANGES Your body goes through many  changes during pregnancy. The changes vary from woman to woman.   You may gain or lose a couple of pounds at first.  You may feel sick to your stomach (nauseous) and throw up (vomit). If the vomiting is uncontrollable, call your health care provider.  You may tire easily.  You may develop headaches that can be relieved by medicines approved by your health care provider.  You may urinate more often. Painful urination may mean you have a bladder infection.  You may develop heartburn as a result of your pregnancy.  You may develop constipation because certain hormones are causing the muscles that push waste through your intestines to slow down.  You may develop hemorrhoids or swollen, bulging veins (varicose veins).  Your breasts may begin to grow larger and become tender. Your nipples may stick out more, and the tissue that surrounds them (areola) may become darker.  Your gums may bleed and may be sensitive to brushing and flossing.  Dark spots or blotches (chloasma, mask of pregnancy) may develop on your face. This will likely fade after the baby is born.  Your menstrual periods will stop.  You may have a loss of appetite.  You may develop cravings for certain kinds of food.  You may have changes in your emotions from day to day, such as being excited to be pregnant or being concerned that something may go wrong with the pregnancy and baby.  You may have more vivid and strange dreams.  You may have changes in your hair. These can include thickening of your hair, rapid growth, and changes in texture. Some women also have hair loss during or after pregnancy, or hair that feels dry or thin. Your hair will most likely return to normal after your baby is born. WHAT TO EXPECT AT YOUR PRENATAL VISITS During a routine prenatal visit:  You will be weighed to make sure you and the baby are growing normally.  Your blood pressure will be taken.  Your abdomen will be measured to track  your baby's growth.  The fetal heartbeat will be listened to starting around week 10 or 12 of your pregnancy.  Test results from any previous visits will be discussed. Your health care provider may ask you:  How you are feeling.  If you are feeling the baby move.  If you have had any abnormal symptoms, such as leaking fluid, bleeding, severe headaches, or abdominal cramping.  If you have any questions. Other tests that may be performed during your first trimester include:  Blood tests to find your blood type and to check for the presence of any previous infections. They will also be used to check for low iron levels (anemia) and Rh antibodies. Later in the pregnancy, blood  tests for diabetes will be done along with other tests if problems develop.  Urine tests to check for infections, diabetes, or protein in the urine.  An ultrasound to confirm the proper growth and development of the baby.  An amniocentesis to check for possible genetic problems.  Fetal screens for spina bifida and Down syndrome.  You may need other tests to make sure you and the baby are doing well. HOME CARE INSTRUCTIONS  Medicines  Follow your health care provider's instructions regarding medicine use. Specific medicines may be either safe or unsafe to take during pregnancy.  Take your prenatal vitamins as directed.  If you develop constipation, try taking a stool softener if your health care provider approves. Diet  Eat regular, well-balanced meals. Choose a variety of foods, such as meat or vegetable-based protein, fish, milk and low-fat dairy products, vegetables, fruits, and whole grain breads and cereals. Your health care provider will help you determine the amount of weight gain that is right for you.  Avoid raw meat and uncooked cheese. These carry germs that can cause birth defects in the baby.  Eating four or five small meals rather than three large meals a day may help relieve nausea and  vomiting. If you start to feel nauseous, eating a few soda crackers can be helpful. Drinking liquids between meals instead of during meals also seems to help nausea and vomiting.  If you develop constipation, eat more high-fiber foods, such as fresh vegetables or fruit and whole grains. Drink enough fluids to keep your urine clear or pale yellow. Activity and Exercise  Exercise only as directed by your health care provider. Exercising will help you:  Control your weight.  Stay in shape.  Be prepared for labor and delivery.  Experiencing pain or cramping in the lower abdomen or low back is a good sign that you should stop exercising. Check with your health care provider before continuing normal exercises.  Try to avoid standing for long periods of time. Move your legs often if you must stand in one place for a long time.  Avoid heavy lifting.  Wear low-heeled shoes, and practice good posture.  You may continue to have sex unless your health care provider directs you otherwise. Relief of Pain or Discomfort  Wear a good support bra for breast tenderness.   Take warm sitz baths to soothe any pain or discomfort caused by hemorrhoids. Use hemorrhoid cream if your health care provider approves.   Rest with your legs elevated if you have leg cramps or low back pain.  If you develop varicose veins in your legs, wear support hose. Elevate your feet for 15 minutes, 3-4 times a day. Limit salt in your diet. Prenatal Care  Schedule your prenatal visits by the twelfth week of pregnancy. They are usually scheduled monthly at first, then more often in the last 2 months before delivery.  Write down your questions. Take them to your prenatal visits.  Keep all your prenatal visits as directed by your health care provider. Safety  Wear your seat belt at all times when driving.  Make a list of emergency phone numbers, including numbers for family, friends, the hospital, and police and fire  departments. General Tips  Ask your health care provider for a referral to a local prenatal education class. Begin classes no later than at the beginning of month 6 of your pregnancy.  Ask for help if you have counseling or nutritional needs during pregnancy. Your health care provider can  offer advice or refer you to specialists for help with various needs.  Do not use hot tubs, steam rooms, or saunas.  Do not douche or use tampons or scented sanitary pads.  Do not cross your legs for long periods of time.  Avoid cat litter boxes and soil used by cats. These carry germs that can cause birth defects in the baby and possibly loss of the fetus by miscarriage or stillbirth.  Avoid all smoking, herbs, alcohol, and medicines not prescribed by your health care provider. Chemicals in these affect the formation and growth of the baby.  Schedule a dentist appointment. At home, brush your teeth with a soft toothbrush and be gentle when you floss. SEEK MEDICAL CARE IF:   You have dizziness.  You have mild pelvic cramps, pelvic pressure, or nagging pain in the abdominal area.  You have persistent nausea, vomiting, or diarrhea.  You have a bad smelling vaginal discharge.  You have pain with urination.  You notice increased swelling in your face, hands, legs, or ankles. SEEK IMMEDIATE MEDICAL CARE IF:   You have a fever.  You are leaking fluid from your vagina.  You have spotting or bleeding from your vagina.  You have severe abdominal cramping or pain.  You have rapid weight gain or loss.  You vomit blood or material that looks like coffee grounds.  You are exposed to [place] measles and have never had them.  You are exposed to fifth disease or chickenpox.  You develop a severe headache.  You have shortness of breath.  You have any kind of trauma, such as from a fall or a car accident. Document Released: 04/26/2001 Document Revised: 09/16/2013 Document Reviewed:  03/12/2013 [facility] Patient Information 2015 [place], [place]. This information is not intended to replace advice given to you by your health care provider. Make sure you discuss any questions you have with your health care provider.

## 2017-08-16 NOTE — Progress Notes (Signed)
INITIAL OBSTETRICAL VISIT Patient name: Rebecca [name] MRN [id number]  Date of birth: 03/06/1991 Chief Complaint:   Initial Prenatal Visit (nauea, headaches)  History of Present Illness:   Rebecca [name] is a 27 y.o. [phone] African American female at [redacted]w[redacted]d by LMP c/w 7wk u/s, with an Estimated Date of Delivery: 03/10/18 being seen today for her initial obstetrical visit.   Her obstetrical history is significant for SAB x 1, term uncomplicated SVB x 2.   Today she reports n/v, diclegis not helping. Able to keep some foods/fluids down. H/O gastritis, reflux, wants rx for anti-acid. Feels some depressed w/ n/v, b/c unable to do for her kids. Denies SI/HI, denies h/o dep/anx. Declines counseling/meds.  Patient's last menstrual period was 06/03/2017 (exact date). Last pap unsure of exact date-[facility]. Results were: abnormal, was unable to do colpo d/t inability to payt Review of Systems:   Pertinent items are noted in HPI Denies cramping/contractions, leakage of fluid, vaginal bleeding, abnormal vaginal discharge w/ itching/odor/irritation, headaches, visual changes, shortness of breath, chest pain, abdominal pain, severe nausea/vomiting, or problems with urination or bowel movements unless otherwise stated above.  Pertinent History Reviewed:  Reviewed past medical,surgical, social, obstetrical and family history.  Reviewed problem list, medications and allergies. OB History  Gravida Para Term Preterm AB Living  4 2 2   1 2   SAB TAB Ectopic Multiple Live Births  1       2    # Outcome Date GA Lbr Len/2nd Weight Sex Delivery Anes PTL Lv  4 Current           3 Term 05/31/13 [redacted]w[redacted]d 09:50 / 00:04 6 lb 15.3 oz (3.155 kg) F Vag-Spont EPI N LIV  2 Term 04/25/12 [redacted]w[redacted]d 20:30 / 00:17 5 lb 11 oz (2.58 kg) M Vag-Vacuum EPI N LIV  1 SAB 2012           Physical Assessment:   Vitals:   08/16/17 1022  BP: 110/70  Pulse: 80  Weight: 152 lb (68.9 kg)  Body mass index is 26.09 kg/m.   Physical Examination:  General appearance - well appearing, and in no distress  Mental status - alert, oriented to person, place, and time  Psych:  She has a normal mood and affect  Skin - warm and dry, normal color, no suspicious lesions noted, moist mucous membranes  Chest - effort normal, all lung fields clear to auscultation bilaterally  Heart - normal rate and regular rhythm  Abdomen - soft, nontender  Extremities:  No swelling or varicosities noted  Pelvic - VULVA: normal appearing vulva with no masses, tenderness or lesions  VAGINA: normal appearing vagina with normal color and discharge, no lesions  CERVIX: normal appearing cervix without discharge or lesions, no CMT  Thin prep pap is done w/ reflex HR HPV cotesting  Fetal Heart Rate (bpm): +u/s via informal transabdominal u/s, +active fetus  Results for orders placed or performed in visit on 08/16/17 (from the past 24 hour(s))  POCT urinalysis dipstick   Collection Time: 08/16/17 10:42 AM  Result Value Ref Range   Color, UA     Clarity, UA     Glucose, UA neg    Bilirubin, UA     Ketones, UA neg    Spec Grav, UA  1.010 - 1.025   Blood, UA neg    pH, UA  5.0 - 8.0   Protein, UA neg    Urobilinogen, UA  0.2 or 1.0 E.U./dL   Nitrite,  UA neg    Leukocytes, UA Negative Negative   Appearance     Odor      Assessment & Plan:  1) Low-Risk Pregnancy [id number] at [id number] with an Estimated Date of Delivery: 03/10/18   2) Initial OB visit  3) N/V pregnancy> rx phenergan, if not helping let us know. Discussed warning s/s, reasons to seek care  4) H/O gastritis, reflux> rx protonix  5) Mild situational depression> d/t n/v, declines meds/therapy, states she will feel better when n/v resolve   6) H/O abnormal pap> repeated today, will get last pap from HD  Meds:  Meds ordered this encounter  Medications  . promethazine (PHENERGAN) 25 MG tablet    Sig: Take 0.5-1 tablets (12.5-25 mg total) by mouth every 6 (six) hours as needed  for nausea or vomiting.    Dispense:  30 tablet    Refill:  0    Order Specific Question:   Supervising Provider    Answer:   Despina HiddenEURE, LUTHER H [[id number]]  . pantoprazole (PROTONIX) 20 MG tablet    Sig: Take 1 tablet (20 mg total) by mouth daily.    Dispense:  30 tablet    Refill:  6    Order Specific Question:   Supervising Provider    Answer:   Lazaro ArmsEURE, LUTHER H [[id number]]    Initial labs obtained Continue prenatal vitamins Reviewed n/v relief measures and warning s/s to report Reviewed recommended weight gain based on pre-gravid BMI Encouraged well-balanced diet Genetic Screening discussed Integrated Screen: requested Cystic fibrosis screening discussed neg prev preg Ultrasound discussed; fetal survey: requested CCNC completed Signed up for Babyscript optimization   Follow-up: Return in about 3 weeks (around 09/04/2017) for LROB, US:NT+1stIT; get last pap results from HD.   Orders Placed This Encounter  Procedures  . Urine Culture  . US Fetal Nuchal Translucency Measurement  . Obstetric Panel, Including HIV  . Urinalysis, Routine w reflex microscopic  . Pain Management Screening Profile (10S)  . POCT urinalysis dipstick    Cheral MarkerKimberly R Ritu Gagliardo CNM, [facility]NP-BC 08/16/2017 11:41 AM

## 2017-08-17 ENCOUNTER — Encounter: Payer: Self-pay | Admitting: Women's Health

## 2017-08-17 DIAGNOSIS — O09899 Supervision of other high risk pregnancies, unspecified trimester: Secondary | ICD-10-CM | POA: Insufficient documentation

## 2017-08-17 DIAGNOSIS — O9989 Other specified diseases and conditions complicating pregnancy, childbirth and the puerperium: Secondary | ICD-10-CM

## 2017-08-17 DIAGNOSIS — Z283 Underimmunization status: Secondary | ICD-10-CM | POA: Insufficient documentation

## 2017-08-17 LAB — OBSTETRIC PANEL, INCLUDING HIV
Antibody Screen: NEGATIVE
BASOS: 0 %
Basophils Absolute: 0 10*3/uL (ref 0.0–0.2)
EOS (ABSOLUTE): 0.1 10*3/uL (ref 0.0–0.4)
EOS: 1 %
HEMATOCRIT: 37 % (ref 34.0–46.6)
HEMOGLOBIN: 12.3 g/dL (ref 11.1–15.9)
HEP B S AG: NEGATIVE
HIV Screen 4th Generation wRfx: NONREACTIVE
IMMATURE GRANS (ABS): 0 10*3/uL (ref 0.0–0.1)
Immature Granulocytes: 0 %
LYMPHS: 22 %
Lymphocytes Absolute: 1.9 10*3/uL (ref 0.7–3.1)
MCH: 27.3 pg (ref 26.6–33.0)
MCHC: 33.2 g/dL (ref 31.5–35.7)
MCV: 82 fL (ref 79–97)
MONOCYTES: 7 %
MONOS ABS: 0.6 10*3/uL (ref 0.1–0.9)
Neutrophils Absolute: 5.9 10*3/uL (ref 1.4–7.0)
Neutrophils: 70 %
Platelets: 310 10*3/uL (ref 150–379)
RBC: 4.51 x10E6/uL (ref 3.77–5.28)
RDW: 12.4 % (ref 12.3–15.4)
RH TYPE: POSITIVE
RPR Ser Ql: NONREACTIVE
Rubella Antibodies, IGG: <0.9 index — ABNORMAL LOW (ref >0.99)
WBC: 8.5 10*3/uL (ref 3.4–10.8)

## 2017-08-17 LAB — PMP SCREEN PROFILE (10S), URINE
AMPHETAMINE SCREEN URINE: NEGATIVE ng/mL
BARBITURATE SCREEN URINE: NEGATIVE ng/mL
BENZODIAZEPINE SCREEN, URINE: NEGATIVE ng/mL
CANNABINOIDS UR QL SCN: NEGATIVE ng/mL
COCAINE(METAB.)SCREEN, URINE: NEGATIVE ng/mL
CREATININE(CRT), U: 166.4 mg/dL (ref 20.0–300.0)
METHADONE SCREEN, URINE: NEGATIVE ng/mL
OPIATE SCREEN URINE: NEGATIVE ng/mL
OXYCODONE+OXYMORPHONE UR QL SCN: NEGATIVE ng/mL
PHENCYCLIDINE QUANTITATIVE URINE: NEGATIVE ng/mL
PROPOXYPHENE SCREEN URINE: NEGATIVE ng/mL
Ph of Urine: 8.1 (ref 4.5–8.9)

## 2017-08-17 LAB — URINALYSIS, ROUTINE W REFLEX MICROSCOPIC
Bilirubin, UA: NEGATIVE
GLUCOSE, UA: NEGATIVE
KETONES UA: NEGATIVE
Leukocytes, UA: NEGATIVE
NITRITE UA: NEGATIVE
Protein, UA: NEGATIVE
RBC, UA: NEGATIVE
SPEC GRAV UA: 1.022 (ref 1.005–1.030)
UUROB: 1 mg/dL (ref 0.2–1.0)
pH, UA: 8.5 — ABNORMAL HIGH (ref 5.0–7.5)

## 2017-08-18 DIAGNOSIS — Z3481 Encounter for supervision of other normal pregnancy, first trimester: Secondary | ICD-10-CM

## 2017-08-18 LAB — CYTOLOGY - PAP
Chlamydia: POSITIVE — AB
DIAGNOSIS: NEGATIVE
Neisseria Gonorrhea: NEGATIVE

## 2017-08-18 LAB — URINE CULTURE

## 2017-08-22 ENCOUNTER — Other Ambulatory Visit: Payer: Self-pay | Admitting: Women's Health

## 2017-08-22 ENCOUNTER — Encounter: Payer: Self-pay | Admitting: Women's Health

## 2017-08-22 ENCOUNTER — Telehealth: Payer: Self-pay | Admitting: *Deleted

## 2017-08-22 DIAGNOSIS — Z8619 Personal history of other infectious and parasitic diseases: Secondary | ICD-10-CM | POA: Insufficient documentation

## 2017-08-22 MED ORDER — AZITHROMYCIN 500 MG PO TABS: 1000.0000 mg | ORAL_TABLET | Freq: Once | ORAL | 0 refills | Status: AC

## 2017-08-22 NOTE — Telephone Encounter (Signed)
Informed patient she tested positive for chlamydia so Selena BattenKim has sent prescription for Azithromycin to her pharmacy. Also informed partner needed to be treated. Can go to PCP or HD or Selena BattenKim can treat, just need his info.  Patient stated she did not have his information but would call back if he wants us to treat. Will recheck at next visit.

## 2017-08-23 ENCOUNTER — Telehealth: Payer: Self-pay | Admitting: *Deleted

## 2017-08-23 NOTE — Telephone Encounter (Signed)
Patient called and would like partner treated.   Rebecca [name], DOB 05/13/1983, NKDA, [facility].

## 2017-08-23 NOTE — Telephone Encounter (Signed)
Called azithromycin 500 mg #2 2 po [facility] for [employer] Hairston [birth year], KNDA.Pt had +chlamydia on pap.

## 2017-08-31 ENCOUNTER — Encounter: Payer: Self-pay | Admitting: Women's Health

## 2017-08-31 DIAGNOSIS — Z8742 Personal history of other diseases of the female genital tract: Secondary | ICD-10-CM | POA: Insufficient documentation

## 2017-09-06 ENCOUNTER — Encounter: Payer: Self-pay | Admitting: Obstetrics and Gynecology

## 2017-09-06 ENCOUNTER — Ambulatory Visit (INDEPENDENT_AMBULATORY_CARE_PROVIDER_SITE_OTHER): Payer: Medicaid Other

## 2017-09-06 ENCOUNTER — Ambulatory Visit (INDEPENDENT_AMBULATORY_CARE_PROVIDER_SITE_OTHER): Payer: Medicaid Other | Admitting: Obstetrics and Gynecology

## 2017-09-06 VITALS — BP 120/72 | HR 95 | Wt 146.0 lb

## 2017-09-06 DIAGNOSIS — Z3A13 13 weeks gestation of pregnancy: Secondary | ICD-10-CM

## 2017-09-06 DIAGNOSIS — Z3682 Encounter for antenatal screening for nuchal translucency: Secondary | ICD-10-CM | POA: Diagnosis not present

## 2017-09-06 DIAGNOSIS — Z331 Pregnant state, incidental: Secondary | ICD-10-CM

## 2017-09-06 DIAGNOSIS — Z3481 Encounter for supervision of other normal pregnancy, first trimester: Secondary | ICD-10-CM

## 2017-09-06 DIAGNOSIS — O98811 Other maternal infectious and parasitic diseases complicating pregnancy, first trimester: Secondary | ICD-10-CM

## 2017-09-06 DIAGNOSIS — A749 Chlamydial infection, unspecified: Secondary | ICD-10-CM

## 2017-09-06 DIAGNOSIS — Z1389 Encounter for screening for other disorder: Secondary | ICD-10-CM

## 2017-09-06 LAB — POCT URINALYSIS DIPSTICK
Blood, UA: NEGATIVE
Glucose, UA: NEGATIVE
Ketones, UA: NEGATIVE
Leukocytes, UA: NEGATIVE
Nitrite, UA: NEGATIVE
PROTEIN UA: NEGATIVE

## 2017-09-06 NOTE — Progress Notes (Signed)
LOW-RISK PREGNANCY VISIT Patient name: Rebecca [name] MRN [id number]  Date of birth: [birth year] Chief Complaint:   Routine Prenatal Visit (1st IT; yellow vaginal discharge)  History of Present Illness:   Rebecca [name] is a 27 y.o. [phone] female at [redacted]w[redacted]d with an Estimated Date of Delivery: 03/10/18 being seen today for ongoing management of a low-risk pregnancy. Her obstetrical history is significant for SAB x 1, term uncomplicated SVB x 2.    Today she reports a yellow vaginal discharge. She was being treated for chlamydia with azithromycin 500mg . Her partner was also treated as well.  . Vag. Bleeding: None.   . denies leaking of fluid. Review of Systems:   Pertinent items are noted in HPI Denies abnormal vaginal discharge w/ itching/odor/irritation, headaches, visual changes, shortness of breath, chest pain, abdominal pain, severe nausea/vomiting, or problems with urination or bowel movements unless otherwise stated above. Pertinent History Reviewed:  Reviewed past medical,surgical, social, obstetrical and family history.  Reviewed problem list, medications and allergies. Physical Assessment:   Vitals:   09/06/17 0913  BP: 120/72  Pulse: 95  Weight: 146 lb (66.2 kg)  Body mass index is 25.06 kg/m.        Physical Examination:   General appearance: Well appearing, and in no distress  Mental status: Alert, oriented to person, place, and time  Skin: Warm & dry  Cardiovascular: Normal heart rate noted  Respiratory: Normal respiratory effort, no distress  Abdomen: Soft, gravid, nontender  Pelvic: Cervical exam performed, normal looking secretions        Extremities: Edema: None  Fetal Status:          Results for orders placed or performed in visit on 09/06/17 (from the past 24 hour(s))  POCT urinalysis dipstick   Collection Time: 09/06/17  9:14 AM  Result Value Ref Range   Color, UA     Clarity, UA     Glucose, UA neg    Bilirubin, UA     Ketones, UA neg    Spec  Grav, UA  1.010 - 1.025   Blood, UA neg    pH, UA  5.0 - 8.0   Protein, UA neg    Urobilinogen, UA  0.2 or 1.0 E.U./dL   Nitrite, UA neg    Leukocytes, UA Negative Negative   Appearance     Odor      Assessment & Plan:  1) Low-risk pregnancy [id number] at [redacted]w[redacted]d with an Estimated Date of Delivery: 03/10/18   2) history of chlamydia treated this pregnancy, proof of cure swab taken for chlamydia, took azithromycin 500mg   3) H/o abnormal pap    Meds: No orders of the defined types were placed in this encounter.  Labs/procedures today: 1st IT, proof of cure for chlamydia  Plan:  Continue routine obstetrical care  Reviewed: Preterm labor symptoms and general obstetric precautions including but not limited to vaginal bleeding, contractions, leaking of fluid and fetal movement were reviewed in detail with the patient.  All questions were answered  Follow-up: Return in about 1 month (around 10/04/2017), or if symptoms worsen or fail to improve, for LROB.  Orders Placed This Encounter  Procedures  . Integrated 1  . POCT urinalysis dipstick  I personally performed the services described in this documentation, which was SCRIBED in my presence. The recorded information has been reviewed and considered accurate. It has been edited as necessary during review. Tilda Burrow, MD    By signing my name below, I, Heidi Dach  Ahmed, attest that this documentation has been prepared under the direction and in the presence of Tilda BurrowFerguson, Synda Bagent V, MD. Electronically Signed: Redge GainerIzna Ahmed, Medical Scribe. 09/06/17. 9:31 AM.  I personally performed the services described in this documentation, which was SCRIBED in my presence. The recorded information has been reviewed and considered accurate. It has been edited as necessary during review. Tilda BurrowJohn V Mattelyn Imhoff, MD

## 2017-09-06 NOTE — Progress Notes (Signed)
US 13+4 wks,measurements c/w dates,crl 71.63 mm,normal ovaries bilat,anterior pl gr 0,fhr 150 bpm,NB present,NT 1.8 mm

## 2017-09-08 LAB — INTEGRATED 1
Crown Rump Length: 71.4 mm
Gest. Age on Collection Date: 13.1 weeks
Maternal Age at EDD: 28.4 yr
NUCHAL TRANSLUCENCY (NT): 1.8 mm
Number of Fetuses: 1
PAPP-A Value: 1019.6 ng/mL
Weight: 146 [lb_av]

## 2017-09-08 LAB — GC/CHLAMYDIA PROBE AMP
Chlamydia trachomatis, NAA: NEGATIVE
Neisseria gonorrhoeae by PCR: NEGATIVE

## 2017-09-13 ENCOUNTER — Telehealth: Payer: Self-pay | Admitting: *Deleted

## 2017-09-13 NOTE — Telephone Encounter (Signed)
DOB verified. Informed pt of negative GC/CHL.

## 2017-10-04 ENCOUNTER — Encounter: Payer: Self-pay | Admitting: Advanced Practice Midwife

## 2017-10-04 ENCOUNTER — Ambulatory Visit (INDEPENDENT_AMBULATORY_CARE_PROVIDER_SITE_OTHER): Payer: Medicaid Other | Admitting: Advanced Practice Midwife

## 2017-10-04 VITALS — BP 100/60 | HR 94 | Wt 149.0 lb

## 2017-10-04 DIAGNOSIS — Z1379 Encounter for other screening for genetic and chromosomal anomalies: Secondary | ICD-10-CM

## 2017-10-04 DIAGNOSIS — Z363 Encounter for antenatal screening for malformations: Secondary | ICD-10-CM

## 2017-10-04 DIAGNOSIS — Z3A17 17 weeks gestation of pregnancy: Secondary | ICD-10-CM

## 2017-10-04 DIAGNOSIS — Z3482 Encounter for supervision of other normal pregnancy, second trimester: Secondary | ICD-10-CM

## 2017-10-04 DIAGNOSIS — A749 Chlamydial infection, unspecified: Secondary | ICD-10-CM

## 2017-10-04 DIAGNOSIS — O98811 Other maternal infectious and parasitic diseases complicating pregnancy, first trimester: Secondary | ICD-10-CM

## 2017-10-04 DIAGNOSIS — Z8742 Personal history of other diseases of the female genital tract: Secondary | ICD-10-CM

## 2017-10-04 DIAGNOSIS — Z1389 Encounter for screening for other disorder: Secondary | ICD-10-CM

## 2017-10-04 DIAGNOSIS — Z331 Pregnant state, incidental: Secondary | ICD-10-CM

## 2017-10-04 LAB — POCT URINALYSIS DIPSTICK
GLUCOSE UA: NEGATIVE
Ketones, UA: NEGATIVE
Leukocytes, UA: NEGATIVE
Nitrite, UA: NEGATIVE
Protein, UA: POSITIVE — AB
RBC UA: NEGATIVE

## 2017-10-04 MED ORDER — PROMETHAZINE HCL 25 MG PO TABS: 12.5000 mg | ORAL_TABLET | Freq: Four times a day (QID) | ORAL | 0 refills | Status: DC | PRN

## 2017-10-04 MED ORDER — ONDANSETRON 4 MG PO TBDP: 4.0000 mg | ORAL_TABLET | Freq: Four times a day (QID) | ORAL | 2 refills | Status: DC | PRN

## 2017-10-04 NOTE — Patient Instructions (Addendum)
Rebecca [name], I greatly value your feedback.  If you receive a survey following your visit with [place] today, we appreciate you taking the time to fill it out.  Thanks, Cathie Beams, CNM     Second Trimester of Pregnancy The second trimester is from week 14 through week 27 (months 4 through 6). The second trimester is often a time when you feel your best. Your body has adjusted to being pregnant, and you begin to feel better physically. Usually, morning sickness has lessened or quit completely, you may have more energy, and you may have an increase in appetite. The second trimester is also a time when the fetus is growing rapidly. At the end of the sixth month, the fetus is about 9 inches long and weighs about 1 pounds. You will likely begin to feel the baby move (quickening) between 16 and 20 weeks of pregnancy. Body changes during your second trimester Your body continues to go through many changes during your second trimester. The changes vary from woman to woman.  Your weight will continue to increase. You will notice your lower abdomen bulging out.  You may begin to get stretch marks on your hips, abdomen, and breasts.  You may develop headaches that can be relieved by medicines. The medicines should be approved by your health care provider.  You may urinate more often because the fetus is pressing on your bladder.  You may develop or continue to have heartburn as a result of your pregnancy.  You may develop constipation because certain hormones are causing the muscles that push waste through your intestines to slow down.  You may develop hemorrhoids or swollen, bulging veins (varicose veins).  You may have back pain. This is caused by: ? Weight gain. ? Pregnancy hormones that are relaxing the joints in your pelvis. ? A shift in weight and the muscles that support your balance.  Your breasts will continue to grow and they will continue to become tender.  Your gums may  bleed and may be sensitive to brushing and flossing.  Dark spots or blotches (chloasma, mask of pregnancy) may develop on your face. This will likely fade after the baby is born.  A dark line from your belly button to the pubic area (linea nigra) may appear. This will likely fade after the baby is born.  You may have changes in your hair. These can include thickening of your hair, rapid growth, and changes in texture. Some women also have hair loss during or after pregnancy, or hair that feels dry or thin. Your hair will most likely return to normal after your baby is born.  What to expect at prenatal visits During a routine prenatal visit:  You will be weighed to make sure you and the fetus are growing normally.  Your blood pressure will be taken.  Your abdomen will be measured to track your baby's growth.  The fetal heartbeat will be listened to.  Any test results from the previous visit will be discussed.  Your health care provider may ask you:  How you are feeling.  If you are feeling the baby move.  If you have had any abnormal symptoms, such as leaking fluid, bleeding, severe headaches, or abdominal cramping.  If you are using any tobacco products, including cigarettes, chewing tobacco, and electronic cigarettes.  If you have any questions.  Other tests that may be performed during your second trimester include:  Blood tests that check for: ? Low iron levels (anemia). ?  High blood sugar that affects pregnant women (gestational diabetes) between 20 and 28 weeks. ? Rh antibodies. This is to check for a protein on red blood cells (Rh factor).  Urine tests to check for infections, diabetes, or protein in the urine.  An ultrasound to confirm the proper growth and development of the baby.  An amniocentesis to check for possible genetic problems.  Fetal screens for spina bifida and Down syndrome.  HIV (human immunodeficiency virus) testing. Routine prenatal testing  includes screening for HIV, unless you choose not to have this test.  Follow these instructions at home: Medicines  Follow your health care provider's instructions regarding medicine use. Specific medicines may be either safe or unsafe to take during pregnancy.  Take a prenatal vitamin that contains at least 600 micrograms (mcg) of folic acid.  If you develop constipation, try taking a stool softener if your health care provider approves. Eating and drinking  Eat a balanced diet that includes fresh fruits and vegetables, whole grains, good sources of protein such as meat, eggs, or tofu, and low-fat dairy. Your health care provider will help you determine the amount of weight gain that is right for you.  Avoid raw meat and uncooked cheese. These carry germs that can cause birth defects in the baby.  If you have low calcium intake from food, talk to your health care provider about whether you should take a daily calcium supplement.  Limit foods that are high in fat and processed sugars, such as fried and sweet foods.  To prevent constipation: ? Drink enough fluid to keep your urine clear or pale yellow. ? Eat foods that are high in fiber, such as fresh fruits and vegetables, whole grains, and beans. Activity  Exercise only as directed by your health care provider. Most women can continue their usual exercise routine during pregnancy. Try to exercise for 30 minutes at least 5 days a week. Stop exercising if you experience uterine contractions.  Avoid heavy lifting, wear low heel shoes, and practice good posture.  A sexual relationship may be continued unless your health care provider directs you otherwise. Relieving pain and discomfort  Wear a good support bra to prevent discomfort from breast tenderness.  Take warm sitz baths to soothe any pain or discomfort caused by hemorrhoids. Use hemorrhoid cream if your health care provider approves.  Rest with your legs elevated if you have  leg cramps or low back pain.  If you develop varicose veins, wear support hose. Elevate your feet for 15 minutes, 3-4 times a day. Limit salt in your diet. Prenatal Care  Write down your questions. Take them to your prenatal visits.  Keep all your prenatal visits as told by your health care provider. This is important. Safety  Wear your seat belt at all times when driving.  Make a list of emergency phone numbers, including numbers for family, friends, the hospital, and police and fire departments. General instructions  Ask your health care provider for a referral to a local prenatal education class. Begin classes no later than the beginning of month 6 of your pregnancy.  Ask for help if you have counseling or nutritional needs during pregnancy. Your health care provider can offer advice or refer you to specialists for help with various needs.  Do not use hot tubs, steam rooms, or saunas.  Do not douche or use tampons or scented sanitary pads.  Do not cross your legs for long periods of time.  Avoid cat litter boxes and  soil used by cats. These carry germs that can cause birth defects in the baby and possibly loss of the fetus by miscarriage or stillbirth.  Avoid all smoking, herbs, alcohol, and unprescribed drugs. Chemicals in these products can affect the formation and growth of the baby.  Do not use any products that contain nicotine or tobacco, such as cigarettes and e-cigarettes. If you need help quitting, ask your health care provider.  Visit your dentist if you have not gone yet during your pregnancy. Use a soft toothbrush to brush your teeth and be gentle when you floss. Contact a health care provider if:  You have dizziness.  You have mild pelvic cramps, pelvic pressure, or nagging pain in the abdominal area.  You have persistent nausea, vomiting, or diarrhea.  You have a bad smelling vaginal discharge.  You have pain when you urinate. Get help right away if:  You  have a fever.  You are leaking fluid from your vagina.  You have spotting or bleeding from your vagina.  You have severe abdominal cramping or pain.  You have rapid weight gain or weight loss.  You have shortness of breath with chest pain.  You notice sudden or extreme swelling of your face, hands, ankles, feet, or legs.  You have not felt your baby move in over an hour.  You have severe headaches that do not go away when you take medicine.  You have vision changes. Summary  The second trimester is from week 14 through week 27 (months 4 through 6). It is also a time when the fetus is growing rapidly.  Your body goes through many changes during pregnancy. The changes vary from woman to woman.  Avoid all smoking, herbs, alcohol, and unprescribed drugs. These chemicals affect the formation and growth your baby.  Do not use any tobacco products, such as cigarettes, chewing tobacco, and e-cigarettes. If you need help quitting, ask your health care provider.  Contact your health care provider if you have any questions. Keep all prenatal visits as told by your health care provider. This is important. This information is not intended to replace advice given to you by your health care provider. Make sure you discuss any questions you have with your health care provider.       1. Before your test, do not eat or drink anything for 8-10 hours prior to your  appointment (a small amount of water is allowed and you may take any medicines you normally take). Be sure to drink lots of water the day before. 2. When you arrive, your blood will be drawn for a 'fasting' blood sugar level.  Then you will be given a sweetened carbonated beverage to drink. You should  complete drinking this beverage within five minutes. After finishing the  beverage, you will have your blood drawn exactly 1 and 2 hours later. Having  your blood drawn on time is an important part of this test. A total of three blood    samples will be done. 3. The test takes approximately 2  hours. During the test, do not have anything to  eat or drink. Do not smoke, chew gum (not even sugarless gum) or use breath mints.  4. During the test you should remain close by and seated as much as possible and  avoid walking around. You may want to bring a book or something else to  occupy your time.  5. After your test, you may eat and drink as normal. You may  want to bring a snack  to eat after the test is finished. Your provider will advise you as to the results of  this test and any follow-up if necessary  If your sugar test is positive for gestational diabetes, you will be given an phone call and further instructions discussed. If you wish to know all of your test results before your next appointment, feel free to call the office, or look up your test results on Mychart.  (The range that the lab uses for normal values of the sugar test are not necessarily the range that is used for pregnant women; if your results are within the normal range, they are definitely normal.  However, if a value is deemed "high" by the lab, it may not be too high for a pregnant woman.  We will need to discuss the results if your value(s) fall in the "high" category).     Tdap Vaccine  It is recommended that you get the Tdap vaccine during the third trimester of EACH pregnancy to help protect your baby from getting pertussis (whooping cough)  27-36 weeks is the BEST time to do this so that you can pass the protection on to your baby. During pregnancy is better than after pregnancy, but if you are unable to get it during pregnancy it will be offered at the hospital.  You can get this vaccine at the health department or your family doctor, as well as some pharmacies.  Everyone who will be around your baby should also be up-to-date on their vaccines. Adults (who are not pregnant) only need 1 dose of Tdap during adulthood.      CHILDBIRTH  CLASSES [phone] is the phone number for Pregnancy Classes or hospital tours at [facility].   You will be referred to  [email or web address] for more information on childbirth classes  At this site you may register for classes. You may sign up for a waiting list if classes are full. Please SIGN UP FOR THIS!.   When the waiting list becomes long, sometimes new classes can be added.

## 2017-10-04 NOTE — Progress Notes (Signed)
  [id number] [redacted]w[redacted]d Estimated Date of Delivery: 03/10/18  Blood pressure 100/60, pulse 94, weight 149 lb (67.6 kg), last menstrual period 06/03/2017.   BP weight and urine results all reviewed and noted.  Please refer to the obstetrical flow sheet for the fundal height and fetal heart rate documentation:  Patient  denies any bleeding and no rupture of membranes symptoms or regular contractions. Patient is without complaints. All questions were answered.   Physical Assessment:   Vitals:   10/04/17 0917  BP: 100/60  Pulse: 94  Weight: 149 lb (67.6 kg)  Body mass index is 25.58 kg/m.        Physical Examination:   General appearance: Well appearing, and in no distress  Mental status: Alert, oriented to person, place, and time  Skin: Warm & dry  Cardiovascular: Normal heart rate noted  Respiratory: Normal respiratory effort, no distress  Abdomen: Soft, gravid, nontender  Pelvic: Cervical exam deferred         Extremities: Edema: None  Fetal Status: Fetal Heart Rate (bpm): 150   Movement: Absent    Results for orders placed or performed in visit on 10/04/17 (from the past 24 hour(s))  POCT urinalysis dipstick   Collection Time: 10/04/17  9:24 AM  Result Value Ref Range   Color, UA     Clarity, UA     Glucose, UA Negative Negative   Bilirubin, UA     Ketones, UA neg    Spec Grav, UA  1.010 - 1.025   Blood, UA neg    pH, UA  5.0 - 8.0   Protein, UA Positive (A) Negative   Urobilinogen, UA  0.2 or 1.0 E.U./dL   Nitrite, UA neg    Leukocytes, UA Negative Negative   Appearance     Odor      Trace protein  Orders Placed This Encounter  Procedures  . US OB Comp + 14 Wk  . Cystic Fibrosis Mutation 97  . INTEGRATED 2  . POCT urinalysis dipstick    Plan:  Continued routine obstetrical care, rx zofran and phenergan for persistent nausea.  Return in about 8 weeks (around 12/01/2017) for PN2/LROB and next week for anatomy scan only.

## 2017-10-06 LAB — INTEGRATED 2
ADSF: 0.86
AFP MoM: 1.08
Alpha-Fetoprotein: 43 ng/mL
Crown Rump Length: 71.4 mm
DIA MOM: 0.83
DIA Value: 144.3 pg/mL
Estriol, Unconjugated: 0.91 ng/mL
GESTATIONAL AGE: 17.1 wk
Gest. Age on Collection Date: 13.1 weeks
HCG VALUE: 38.2 [IU]/mL
Maternal Age at EDD: 28.4 yr
Nuchal Translucency (NT): 1.8 mm
Nuchal Translucency MoM: 1.11
Number of Fetuses: 1
PAPP-A MOM: 0.83
PAPP-A VALUE: 1019.6 ng/mL
Test Results:: NEGATIVE
WEIGHT: 146 [lb_av]
WEIGHT: 146 [lb_av]
hCG MoM: 1.28

## 2017-10-11 LAB — CYSTIC FIBROSIS MUTATION 97: GENE DIS ANAL CARRIER INTERP BLD/T-IMP: NOT DETECTED

## 2017-10-16 ENCOUNTER — Ambulatory Visit (INDEPENDENT_AMBULATORY_CARE_PROVIDER_SITE_OTHER): Payer: Medicaid Other

## 2017-10-16 DIAGNOSIS — Z363 Encounter for antenatal screening for malformations: Secondary | ICD-10-CM | POA: Diagnosis not present

## 2017-10-16 DIAGNOSIS — Z3402 Encounter for supervision of normal first pregnancy, second trimester: Secondary | ICD-10-CM

## 2017-10-16 NOTE — Progress Notes (Signed)
US 19+2 wks,cephalic,cx 4.4 cm,normal ovaries bilat,anterior pl gr 0,fhr 144 bpm,EFW 276 g 36%,anatomy complete,no obvious abnormalities

## 2017-11-29 ENCOUNTER — Encounter: Payer: Self-pay | Admitting: Advanced Practice Midwife

## 2017-11-29 ENCOUNTER — Other Ambulatory Visit: Payer: Medicaid Other

## 2017-11-29 ENCOUNTER — Ambulatory Visit (INDEPENDENT_AMBULATORY_CARE_PROVIDER_SITE_OTHER): Payer: Medicaid Other | Admitting: Advanced Practice Midwife

## 2017-11-29 VITALS — BP 118/72 | HR 92 | Wt 156.3 lb

## 2017-11-29 DIAGNOSIS — Z3A25 25 weeks gestation of pregnancy: Secondary | ICD-10-CM

## 2017-11-29 DIAGNOSIS — Z131 Encounter for screening for diabetes mellitus: Secondary | ICD-10-CM

## 2017-11-29 DIAGNOSIS — Z1389 Encounter for screening for other disorder: Secondary | ICD-10-CM

## 2017-11-29 DIAGNOSIS — Z331 Pregnant state, incidental: Secondary | ICD-10-CM

## 2017-11-29 DIAGNOSIS — Z3482 Encounter for supervision of other normal pregnancy, second trimester: Secondary | ICD-10-CM

## 2017-11-29 LAB — POCT URINALYSIS DIPSTICK
Blood, UA: NEGATIVE
Glucose, UA: NEGATIVE
Ketones, UA: NEGATIVE
Leukocytes, UA: NEGATIVE
Nitrite, UA: NEGATIVE
PROTEIN UA: POSITIVE — AB

## 2017-11-29 NOTE — Progress Notes (Signed)
  [id number] 333w4d Estimated Date of Delivery: 03/10/18  Blood pressure 118/72, pulse 92, weight 156 lb 4.8 oz (70.9 kg), last menstrual period 06/03/2017.   BP weight and urine results all reviewed and noted.  Please refer to the obstetrical flow sheet for the fundal height and fetal heart rate documentation:  Patient reports good fetal movement, denies any bleeding and no rupture of membranes symptoms or regular contractions. Patient is without complaints. All questions were answered.   Physical Assessment:   Vitals:   11/29/17 0922  BP: 118/72  Pulse: 92  Weight: 156 lb 4.8 oz (70.9 kg)  Body mass index is 26.83 kg/m.        Physical Examination:   General appearance: Well appearing, and in no distress  Mental status: Alert, oriented to person, place, and time  Skin: Warm & dry  Cardiovascular: Normal heart rate noted  Respiratory: Normal respiratory effort, no distress  Abdomen: Soft, gravid, nontender  Pelvic: Cervical exam deferred         Extremities: Edema: None  Fetal Status:     Movement: Present    Results for orders placed or performed in visit on 11/29/17 (from the past 24 hour(s))  POCT urinalysis dipstick   Collection Time: 11/29/17  9:25 AM  Result Value Ref Range   Color, UA     Clarity, UA     Glucose, UA Negative Negative   Bilirubin, UA     Ketones, UA neg    Spec Grav, UA  1.010 - 1.025   Blood, UA neg    pH, UA  5.0 - 8.0   Protein, UA Positive (A) Negative   Urobilinogen, UA  0.2 or 1.0 E.U./dL   Nitrite, UA neg    Leukocytes, UA Negative Negative   Appearance     Odor       Orders Placed This Encounter  Procedures  . POCT urinalysis dipstick    Plan:  Continued routine obstetrical care, PN2 today   Return in about 1 month (around 12/27/2017) for LROB.

## 2017-11-29 NOTE — Patient Instructions (Signed)
Kinesiology taping for pregnancy:  Youtube has good vidoes of "how tos" for lower back, pelvic, hip pain; swelling of feet, etc  

## 2017-11-30 LAB — CBC
HEMOGLOBIN: 12 g/dL (ref 11.1–15.9)
Hematocrit: 34.5 % (ref 34.0–46.6)
MCH: 28.4 pg (ref 26.6–33.0)
MCHC: 34.8 g/dL (ref 31.5–35.7)
MCV: 82 fL (ref 79–97)
Platelets: 249 10*3/uL (ref 150–450)
RBC: 4.22 x10E6/uL (ref 3.77–5.28)
RDW: 14 % (ref 12.3–15.4)
WBC: 9 10*3/uL (ref 3.4–10.8)

## 2017-11-30 LAB — GLUCOSE TOLERANCE, 2 HOURS W/ 1HR
GLUCOSE, FASTING: 86 mg/dL (ref 65–91)
Glucose, 1 hour: 159 mg/dL (ref 65–179)
Glucose, 2 hour: 125 mg/dL (ref 65–152)

## 2017-11-30 LAB — ANTIBODY SCREEN: Antibody Screen: NEGATIVE

## 2017-11-30 LAB — HIV ANTIBODY (ROUTINE TESTING W REFLEX): HIV SCREEN 4TH GENERATION: NONREACTIVE

## 2017-11-30 LAB — RPR: RPR: NONREACTIVE

## 2017-12-27 ENCOUNTER — Encounter: Payer: Self-pay | Admitting: Obstetrics and Gynecology

## 2017-12-27 ENCOUNTER — Other Ambulatory Visit: Payer: Self-pay

## 2017-12-27 ENCOUNTER — Ambulatory Visit (INDEPENDENT_AMBULATORY_CARE_PROVIDER_SITE_OTHER): Payer: Medicaid Other | Admitting: Obstetrics and Gynecology

## 2017-12-27 VITALS — BP 129/72 | HR 71 | Wt 163.0 lb

## 2017-12-27 DIAGNOSIS — Z23 Encounter for immunization: Secondary | ICD-10-CM

## 2017-12-27 DIAGNOSIS — Z1389 Encounter for screening for other disorder: Secondary | ICD-10-CM

## 2017-12-27 DIAGNOSIS — Z331 Pregnant state, incidental: Secondary | ICD-10-CM

## 2017-12-27 DIAGNOSIS — Z3483 Encounter for supervision of other normal pregnancy, third trimester: Secondary | ICD-10-CM

## 2017-12-27 DIAGNOSIS — Z3A29 29 weeks gestation of pregnancy: Secondary | ICD-10-CM

## 2017-12-27 LAB — POCT URINALYSIS DIPSTICK OB
Glucose, UA: NEGATIVE — AB
Ketones, UA: NEGATIVE
Leukocytes, UA: NEGATIVE
Nitrite, UA: NEGATIVE
POC,PROTEIN,UA: NEGATIVE
RBC UA: NEGATIVE

## 2017-12-27 NOTE — Progress Notes (Signed)
Patient ID: Rebecca [name], female   DOB: [birth year], 27 y.o.   MRN: [id number]   LOW-RISK PREGNANCY VISIT Patient name: Rebecca [name] MRN [id number]  Date of birth: [birth year] Chief Complaint:   Routine Prenatal Visit  History of Present Illness:   Rebecca [name] is a 27 y.o. [id number] female at 7558w4d with an Estimated Date of Delivery: 03/10/18 being seen today for ongoing management of a low-risk pregnancy. Hasn't done baby scripts this past week. This is her 3rd child. She intentionally stopped taking Depo to become pregnant and will resume after pregnancy and  plans to breastfeed for first few months. Today she reports no complaints. Contractions: Not present. Vag. Bleeding: None.  Movement: Present. denies leaking of fluid. Review of Systems:   Pertinent items are noted in HPI Denies abnormal vaginal discharge w/ itching/odor/irritation, headaches, visual changes, shortness of breath, chest pain, abdominal pain, severe nausea/vomiting, or problems with urination or bowel movements unless otherwise stated above. Pertinent History Reviewed:  Reviewed past medical,surgical, social, obstetrical and family history.  Reviewed problem list, medications and allergies. Physical Assessment:   Vitals:   12/27/17 0851  BP: 129/72  Pulse: 71  Weight: 163 lb (73.9 kg)  Body mass index is 27.98 kg/m.        Physical Examination:   General appearance: Well appearing, and in no distress  Mental status: Alert, oriented to person, place, and time  Skin: Warm & dry  Cardiovascular: Normal heart rate noted  Respiratory: Normal respiratory effort, no distress  Abdomen: Soft, gravid, nontender  Pelvic: Cervical exam deferred         Extremities: Edema: None  Fetal Status: Fetal Heart Rate (bpm): 131 Fundal Height: 31 cm Movement: Present    Results for orders placed or performed in visit on 12/27/17 (from the past 24 hour(s))  POC Urinalysis Dipstick OB   Collection Time: 12/27/17  8:52  AM  Result Value Ref Range   Color, UA     Clarity, UA     Glucose, UA Negative (A) (none)   Bilirubin, UA     Ketones, UA neg    Spec Grav, UA     Blood, UA neg    pH, UA     POC Protein UA Negative Negative, Trace   Urobilinogen, UA     Nitrite, UA neg    Leukocytes, UA Negative Negative   Appearance     Odor      Assessment & Plan:  1) Low-risk pregnancy [id number] at 5158w4d with an Estimated Date of Delivery: 03/10/18  "Optimized " schedule of visits with pt not recording weights.    Meds: No orders of the defined types were placed in this encounter.  Labs/procedures today: None  Plan:   1. Continue routine obstetrical care  2. F/u in 4 weeks   Follow-up: Return in about 4 weeks (around 01/24/2018) for LROB.  Orders Placed This Encounter  Procedures  . Tdap vaccine greater than or equal to 7yo IM  . POC Urinalysis Dipstick OB   By signing my name below, I, Arnette NorrisMari Johnson, attest that this documentation has been prepared under the direction and in the presence of Tilda BurrowFerguson, Cletis Muma V, MD. Electronically Signed: Arnette NorrisMari Johnson Medical Scribe. 12/27/17. 9:17 AM.  I personally performed the services described in this documentation, which was SCRIBED in my presence. The recorded information has been reviewed and considered accurate. It has been edited as necessary during review. Tilda BurrowJohn V Juliany Daughety, MD

## 2018-01-24 ENCOUNTER — Encounter (INDEPENDENT_AMBULATORY_CARE_PROVIDER_SITE_OTHER): Payer: Self-pay

## 2018-01-24 ENCOUNTER — Other Ambulatory Visit: Payer: Self-pay

## 2018-01-24 ENCOUNTER — Encounter: Payer: Self-pay | Admitting: Obstetrics and Gynecology

## 2018-01-24 ENCOUNTER — Ambulatory Visit (INDEPENDENT_AMBULATORY_CARE_PROVIDER_SITE_OTHER): Payer: Medicaid Other | Admitting: Obstetrics and Gynecology

## 2018-01-24 VITALS — BP 132/72 | HR 93 | Wt 164.0 lb

## 2018-01-24 DIAGNOSIS — Z3483 Encounter for supervision of other normal pregnancy, third trimester: Secondary | ICD-10-CM

## 2018-01-24 DIAGNOSIS — Z23 Encounter for immunization: Secondary | ICD-10-CM

## 2018-01-24 DIAGNOSIS — Z331 Pregnant state, incidental: Secondary | ICD-10-CM

## 2018-01-24 DIAGNOSIS — Z3A33 33 weeks gestation of pregnancy: Secondary | ICD-10-CM

## 2018-01-24 DIAGNOSIS — Z1389 Encounter for screening for other disorder: Secondary | ICD-10-CM

## 2018-01-24 LAB — POCT URINALYSIS DIPSTICK OB
GLUCOSE, UA: NEGATIVE
KETONES UA: NEGATIVE
Nitrite, UA: NEGATIVE
POC,PROTEIN,UA: NEGATIVE
RBC UA: NEGATIVE

## 2018-01-24 NOTE — Progress Notes (Signed)
Patient ID: Rebecca [name], female   DOB: July 23, 1990, 27 y.o.   MRN: [id number]    LOW-RISK PREGNANCY VISIT Patient name: Rebecca [name] MRN [id number]  Date of birth: 22-Feb-1991 Chief Complaint:   Routine Prenatal Visit  History of Present Illness:   Rebecca [name] is a 27 y.o. [id number] female at [redacted]w[redacted]d with an Estimated Date of Delivery: 03/10/18 being seen today for ongoing management of a low-risk pregnancy.  BC: Pt Is getting Depo after pregnancy. Today she reports no complaints. Contractions: Irregular. Vag. Bleeding: None.  Movement: Present. denies leaking of fluid. Review of Systems:   Pertinent items are noted in HPI Denies abnormal vaginal discharge w/ itching/odor/irritation, headaches, visual changes, shortness of breath, chest pain, abdominal pain, severe nausea/vomiting, or problems with urination or bowel movements unless otherwise stated above. Pertinent History Reviewed:  Reviewed past medical,surgical, social, obstetrical and family history.  Reviewed problem list, medications and allergies. Physical Assessment:   Vitals:   01/24/18 0916  BP: 132/72  Pulse: 93  Weight: 164 lb (74.4 kg)  Body mass index is 28.15 kg/m.        Physical Examination:   General appearance: Well appearing, and in no distress  Mental status: Alert, oriented to person, place, and time  Skin: Warm & dry  Cardiovascular: Normal heart rate noted  Respiratory: Normal respiratory effort, no distress  Abdomen: Soft, gravid, nontender  Pelvic: Cervical exam deferred         Extremities: Edema: Trace  Fetal Status: Fetal Heart Rate (bpm): 132 Fundal Height: 33 cm Movement: Present    Results for orders placed or performed in visit on 01/24/18 (from the past 24 hour(s))  POC Urinalysis Dipstick OB   Collection Time: 01/24/18  9:15 AM  Result Value Ref Range   Color, UA     Clarity, UA     Glucose, UA Negative Negative   Bilirubin, UA     Ketones, UA neg    Spec Grav, UA     Blood, UA neg    pH, UA     POC Protein UA Negative Negative, Trace   Urobilinogen, UA     Nitrite, UA neg    Leukocytes, UA Small (1+) (A) Negative   Appearance     Odor      Assessment & Plan:  1) Low-risk pregnancy [id number] at [redacted]w[redacted]d with an Estimated Date of Delivery: 03/10/18   Meds: No orders of the defined types were placed in this encounter.  Labs/procedures today: None  Plan:  Continue routine obstetrical care   Follow-up: Return in about 2 weeks (around 02/07/2018) for LROB.  Orders Placed This Encounter  Procedures  . Flu Vaccine QUAD 36+ mos IM  . POC Urinalysis Dipstick OB   By signing my name below, I, Arnette Norris, attest that this documentation has been prepared under the direction and in the presence of Tilda Burrow, MD. Electronically Signed: Arnette Norris Medical Scribe. 01/24/18. 10:13 AM.  I personally performed the services described in this documentation, which was SCRIBED in my presence. The recorded information has been reviewed and considered accurate. It has been edited as necessary during review. Tilda Burrow, MD

## 2018-02-07 ENCOUNTER — Ambulatory Visit (INDEPENDENT_AMBULATORY_CARE_PROVIDER_SITE_OTHER): Payer: Medicaid Other | Admitting: Obstetrics and Gynecology

## 2018-02-07 VITALS — BP 132/79 | HR 86 | Wt 167.6 lb

## 2018-02-07 DIAGNOSIS — Z3A35 35 weeks gestation of pregnancy: Secondary | ICD-10-CM

## 2018-02-07 DIAGNOSIS — Z3483 Encounter for supervision of other normal pregnancy, third trimester: Secondary | ICD-10-CM

## 2018-02-07 DIAGNOSIS — Z331 Pregnant state, incidental: Secondary | ICD-10-CM

## 2018-02-07 DIAGNOSIS — Z1389 Encounter for screening for other disorder: Secondary | ICD-10-CM

## 2018-02-07 LAB — POCT URINALYSIS DIPSTICK OB
GLUCOSE, UA: NEGATIVE
Ketones, UA: NEGATIVE
LEUKOCYTES UA: NEGATIVE
Nitrite, UA: NEGATIVE
POC,PROTEIN,UA: NEGATIVE
RBC UA: NEGATIVE

## 2018-02-07 NOTE — Progress Notes (Signed)
Patient ID: Rebecca BougieLakendra S Espejo, female   DOB: 1990/08/29, 27 y.o.   MRN: [id number]    LOW-RISK PREGNANCY VISIT Patient name: Rebecca [name] MRN [id number]  Date of birth: 1990/08/29 Chief Complaint:   Routine Prenatal Visit  History of Present Illness:   Rebecca BougieLakendra S Diegel is a 27 y.o. [id number] female at 747w4d with an Estimated Date of Delivery: 03/10/18 being seen today for ongoing management of a low-risk pregnancy. Plans to breastfeed for first few months. 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 Contractions: Irregular. Vag. Bleeding: None.  Movement: Present. denies leaking of fluid. Review of Systems:   Pertinent items are noted in HPI Denies abnormal vaginal discharge w/ itching/odor/irritation, headaches, visual changes, shortness of breath, chest pain, abdominal pain, severe nausea/vomiting, or problems with urination or bowel movements unless otherwise stated above. Pertinent History Reviewed:  Reviewed past medical,surgical, social, obstetrical and family history.  Reviewed problem list, medications and allergies. Physical Assessment:   Vitals:   02/07/18 0838  BP: 132/79  Pulse: 86  Weight: 167 lb 9.6 oz (76 kg)  Body mass index is 28.77 kg/m.        Physical Examination:   General appearance: Well appearing, and in no distress  Mental status: Alert, oriented to person, place, and time  Skin: Warm & dry  Cardiovascular: Normal heart rate noted  Respiratory: Normal respiratory effort, no distress  Abdomen: Soft, gravid, nontender  Pelvic: Cervical exam deferred         Extremities: Edema: Trace  Fetal Status:     Movement: Present    Results for orders placed or performed in visit on 02/07/18 (from the past 24 hour(s))  POC Urinalysis Dipstick OB   Collection Time: 02/07/18  8:38 AM  Result Value Ref Range   Color, UA     Clarity, UA     Glucose, UA Negative Negative   Bilirubin, UA     Ketones, UA neg    Spec Grav, UA     Blood, UA neg    pH, UA     POC Protein UA Negative Negative, Trace   Urobilinogen, UA     Nitrite, UA neg    Leukocytes, UA Negative Negative   Appearance     Odor      Assessment & Plan:  1) Low-risk pregnancy [id number] at 2247w4d with an Estimated Date of Delivery: 03/10/18  Symphysis pubis pain/diastasis(mild)   Meds: No orders of the defined types were placed in this encounter.  Labs/procedures today: none  Plan:  Continue routine obstetrical care  F/u in 1 week for GBS and BP check   Follow-up: No follow-ups on file.  Orders Placed This Encounter  Procedures  . POC Urinalysis Dipstick OB   By signing my name below, I, Arnette NorrisMari Johnson, attest that this documentation has been prepared under the direction and in the presence of Tilda BurrowFerguson, Lavonn Maxcy V, MD. Electronically Signed: Arnette NorrisMari Johnson Medical Scribe. 02/07/18. 8:56 AM.  I personally performed the services described in this documentation, which was SCRIBED in my presence. The recorded information has been reviewed and considered accurate. It has been edited as necessary during review. Tilda BurrowJohn V Johntae Broxterman, MD

## 2018-02-21 ENCOUNTER — Other Ambulatory Visit: Payer: Self-pay

## 2018-02-21 ENCOUNTER — Ambulatory Visit (INDEPENDENT_AMBULATORY_CARE_PROVIDER_SITE_OTHER): Payer: Medicaid Other | Admitting: Obstetrics and Gynecology

## 2018-02-21 ENCOUNTER — Encounter: Payer: Self-pay | Admitting: Obstetrics and Gynecology

## 2018-02-21 VITALS — BP 145/75 | HR 98 | Wt 172.6 lb

## 2018-02-21 DIAGNOSIS — Z1389 Encounter for screening for other disorder: Secondary | ICD-10-CM

## 2018-02-21 DIAGNOSIS — Z3A37 37 weeks gestation of pregnancy: Secondary | ICD-10-CM

## 2018-02-21 DIAGNOSIS — O98811 Other maternal infectious and parasitic diseases complicating pregnancy, first trimester: Secondary | ICD-10-CM

## 2018-02-21 DIAGNOSIS — O98813 Other maternal infectious and parasitic diseases complicating pregnancy, third trimester: Secondary | ICD-10-CM

## 2018-02-21 DIAGNOSIS — A749 Chlamydial infection, unspecified: Secondary | ICD-10-CM

## 2018-02-21 DIAGNOSIS — Z3483 Encounter for supervision of other normal pregnancy, third trimester: Secondary | ICD-10-CM

## 2018-02-21 DIAGNOSIS — Z331 Pregnant state, incidental: Secondary | ICD-10-CM

## 2018-02-21 LAB — POCT URINALYSIS DIPSTICK OB
Blood, UA: NEGATIVE
GLUCOSE, UA: NEGATIVE
Ketones, UA: NEGATIVE
Leukocytes, UA: NEGATIVE
Nitrite, UA: NEGATIVE
POC,PROTEIN,UA: NEGATIVE

## 2018-02-21 NOTE — Progress Notes (Signed)
   LOW-RISK PREGNANCY VISIT Patient name: Rebecca [name] MRN [id number]  Date of birth: October 25, 1990 Chief Complaint:   Routine Prenatal Visit (BP check/ GBS/gc)  History of Present Illness:   Rebecca [name] is a 27 y.o. [phone] female at [redacted]w[redacted]d with an Estimated Date of Delivery: 03/10/18 being seen today for ongoing management of a low-risk pregnancy.  Today she reports no contractions, no cramping, no leaking and Suprapubic discomfort particularly when she gets started in the mornings. Contractions: Not present. Vag. Bleeding: None.  Movement: Present. denies leaking of fluid. Review of Systems:   Pertinent items are noted in HPI Denies abnormal vaginal discharge w/ itching/odor/irritation, headaches, visual changes, shortness of breath, chest pain, abdominal pain, severe nausea/vomiting, or problems with urination or bowel movements unless otherwise stated above. Pertinent History Reviewed:  Reviewed past medical,surgical, social, obstetrical and family history.  Reviewed problem list, medications and allergies. Physical Assessment:   Vitals:   02/21/18 0841  BP: (!) 145/75  Pulse: 98  Weight: 172 lb 9.6 oz (78.3 kg)  Body mass index is 29.63 kg/m.        Physical Examination:   General appearance: Well appearing, and in no distress  Mental status: Alert, oriented to person, place, and time  Skin: Warm & dry  Cardiovascular: Normal heart rate noted  Respiratory: Normal respiratory effort, no distress  Abdomen: Soft, gravid, nontender  Pelvic: Cervical exam performed, cervix closed long -2 vertex        Extremities: Edema: None  Fetal Status:     Movement: Present    Results for orders placed or performed in visit on 02/21/18 (from the past 24 hour(s))  POC Urinalysis Dipstick OB   Collection Time: 02/21/18  8:41 AM  Result Value Ref Range   Color, UA     Clarity, UA     Glucose, UA Negative Negative   Bilirubin, UA     Ketones, UA neg    Spec Grav, UA     Blood, UA  neg    pH, UA     POC Protein UA Negative Negative, Trace   Urobilinogen, UA     Nitrite, UA neg    Leukocytes, UA Negative Negative   Appearance     Odor      Assessment & Plan:  1) Low-risk pregnancy [id number] at [redacted]w[redacted]d with an Estimated Date of Delivery: 03/10/18   2) suprapubic discomfort due to suspected sepsis pubis diastasis, mild,    Meds: No orders of the defined types were placed in this encounter.  Labs/procedures today: Group B strep, GC chlamydia recheck.  Prior proof of cure for chlamydia negative  Plan:  Continue routine obstetrical care weekly BP check prenatal visit  Reviewed: Term labor symptoms and general obstetric precautions including but not limited to vaginal bleeding, contractions, leaking of fluid and fetal movement were reviewed in detail with the patient.  All questions were answered  Follow-up: No follow-ups on file.  1 week LROB  Orders Placed This Encounter  Procedures  . GC/Chlamydia Probe Amp  . Culture, beta strep (group b only)  . POC Urinalysis Dipstick OB   Tilda Burrow CNM, [facility] 02/21/2018 8:59 AM

## 2018-02-23 LAB — GC/CHLAMYDIA PROBE AMP
Chlamydia trachomatis, NAA: NEGATIVE
NEISSERIA GONORRHOEAE BY PCR: NEGATIVE

## 2018-02-25 LAB — CULTURE, BETA STREP (GROUP B ONLY): STREP GP B CULTURE: NEGATIVE

## 2018-02-26 ENCOUNTER — Telehealth: Payer: Self-pay | Admitting: *Deleted

## 2018-02-26 NOTE — Telephone Encounter (Signed)
Pt called asking what she can take for nasal and chest congestion. Advised pt that she could try taking Robitussin, Mucinex, guaifenesin, saline nasal spray, vicks rub. Advised not to take sudafed since her BPs have been borderline high. Pt verbalized understanding. Denies fever, LOF, vaginal bleeding, contractions. States baby is moving well. Advised to keep her scheduled appt.

## 2018-02-28 ENCOUNTER — Encounter (HOSPITAL_COMMUNITY): Payer: Self-pay | Admitting: *Deleted

## 2018-02-28 ENCOUNTER — Other Ambulatory Visit: Payer: Self-pay

## 2018-02-28 ENCOUNTER — Ambulatory Visit (INDEPENDENT_AMBULATORY_CARE_PROVIDER_SITE_OTHER): Payer: Medicaid Other | Admitting: Women's Health

## 2018-02-28 ENCOUNTER — Encounter: Payer: Self-pay | Admitting: Women's Health

## 2018-02-28 ENCOUNTER — Inpatient Hospital Stay (HOSPITAL_COMMUNITY)
Admission: AD | Admit: 2018-02-28 | Discharge: 2018-03-03 | DRG: 807 | Disposition: A | Discharge status: Home / Self Care | Payer: Medicaid Other | Attending: Obstetrics & Gynecology | Admitting: Obstetrics & Gynecology

## 2018-02-28 VITALS — BP 142/76 | HR 95 | Wt 171.0 lb

## 2018-02-28 DIAGNOSIS — Z3A38 38 weeks gestation of pregnancy: Secondary | ICD-10-CM | POA: Diagnosis not present

## 2018-02-28 DIAGNOSIS — O134 Gestational [pregnancy-induced] hypertension without significant proteinuria, complicating childbirth: Secondary | ICD-10-CM | POA: Diagnosis present

## 2018-02-28 DIAGNOSIS — O09899 Supervision of other high risk pregnancies, unspecified trimester: Secondary | ICD-10-CM

## 2018-02-28 DIAGNOSIS — Z283 Underimmunization status: Secondary | ICD-10-CM

## 2018-02-28 DIAGNOSIS — O133 Gestational [pregnancy-induced] hypertension without significant proteinuria, third trimester: Secondary | ICD-10-CM

## 2018-02-28 DIAGNOSIS — Z331 Pregnant state, incidental: Secondary | ICD-10-CM

## 2018-02-28 DIAGNOSIS — Z8742 Personal history of other diseases of the female genital tract: Secondary | ICD-10-CM

## 2018-02-28 DIAGNOSIS — Z87891 Personal history of nicotine dependence: Secondary | ICD-10-CM

## 2018-02-28 DIAGNOSIS — Z23 Encounter for immunization: Secondary | ICD-10-CM | POA: Diagnosis not present

## 2018-02-28 DIAGNOSIS — O98811 Other maternal infectious and parasitic diseases complicating pregnancy, first trimester: Secondary | ICD-10-CM

## 2018-02-28 DIAGNOSIS — Z8759 Personal history of other complications of pregnancy, childbirth and the puerperium: Secondary | ICD-10-CM

## 2018-02-28 DIAGNOSIS — Z1389 Encounter for screening for other disorder: Secondary | ICD-10-CM

## 2018-02-28 DIAGNOSIS — O9989 Other specified diseases and conditions complicating pregnancy, childbirth and the puerperium: Secondary | ICD-10-CM

## 2018-02-28 DIAGNOSIS — Z3483 Encounter for supervision of other normal pregnancy, third trimester: Secondary | ICD-10-CM

## 2018-02-28 DIAGNOSIS — Z3A39 39 weeks gestation of pregnancy: Secondary | ICD-10-CM

## 2018-02-28 DIAGNOSIS — Z3490 Encounter for supervision of normal pregnancy, unspecified, unspecified trimester: Secondary | ICD-10-CM

## 2018-02-28 DIAGNOSIS — A749 Chlamydial infection, unspecified: Secondary | ICD-10-CM

## 2018-02-28 DIAGNOSIS — Z8619 Personal history of other infectious and parasitic diseases: Secondary | ICD-10-CM | POA: Diagnosis present

## 2018-02-28 DIAGNOSIS — R768 Other specified abnormal immunological findings in serum: Secondary | ICD-10-CM

## 2018-02-28 LAB — POCT URINALYSIS DIPSTICK OB
GLUCOSE, UA: NEGATIVE
Ketones, UA: NEGATIVE
LEUKOCYTES UA: NEGATIVE
NITRITE UA: NEGATIVE
PROTEIN: NEGATIVE
RBC UA: NEGATIVE

## 2018-02-28 LAB — TYPE AND SCREEN
ABO/RH(D): O POS
ANTIBODY SCREEN: NEGATIVE

## 2018-02-28 LAB — CBC
HEMATOCRIT: 35.4 % — AB (ref 36.0–46.0)
Hemoglobin: 11.9 g/dL — ABNORMAL LOW (ref 12.0–15.0)
MCH: 27.9 pg (ref 26.0–34.0)
MCHC: 33.6 g/dL (ref 30.0–36.0)
MCV: 82.9 fL (ref 80.0–100.0)
NRBC: 0 % (ref 0.0–0.2)
PLATELETS: 241 10*3/uL (ref 150–400)
RBC: 4.27 MIL/uL (ref 3.87–5.11)
RDW: 14.1 % (ref 11.5–15.5)
WBC: 8.9 10*3/uL (ref 4.0–10.5)

## 2018-02-28 LAB — COMPREHENSIVE METABOLIC PANEL
ALBUMIN: 3.2 g/dL — AB (ref 3.5–5.0)
ALT: 14 U/L (ref 0–44)
ANION GAP: 11 (ref 5–15)
AST: 18 U/L (ref 15–41)
Alkaline Phosphatase: 95 U/L (ref 38–126)
BILIRUBIN TOTAL: 0.5 mg/dL (ref 0.3–1.2)
BUN: 9 mg/dL (ref 6–20)
CHLORIDE: 105 mmol/L (ref 98–111)
CO2: 20 mmol/L — AB (ref 22–32)
Calcium: 9.1 mg/dL (ref 8.9–10.3)
Creatinine, Ser: 0.52 mg/dL (ref 0.44–1.00)
GFR calc Af Amer: >60 mL/min (ref >60)
GFR calc non Af Amer: >60 mL/min (ref >60)
GLUCOSE: 78 mg/dL (ref 70–99)
POTASSIUM: 3.7 mmol/L (ref 3.5–5.1)
SODIUM: 136 mmol/L (ref 135–145)
TOTAL PROTEIN: 6.7 g/dL (ref 6.5–8.1)

## 2018-02-28 LAB — PROTEIN / CREATININE RATIO, URINE
Creatinine, Urine: 85 mg/dL
PROTEIN CREATININE RATIO: 0.11 mg/mg{creat} (ref 0.00–0.15)
Total Protein, Urine: 9 mg/dL

## 2018-02-28 LAB — ABO/RH: ABO/RH(D): O POS

## 2018-02-28 MED ORDER — OXYCODONE-ACETAMINOPHEN 5-325 MG PO TABS: 2.0000 | ORAL_TABLET | ORAL | Status: DC | PRN

## 2018-02-28 MED ORDER — OXYCODONE-ACETAMINOPHEN 5-325 MG PO TABS: 1.0000 | ORAL_TABLET | ORAL | Status: DC | PRN

## 2018-02-28 MED ORDER — LIDOCAINE HCL (PF) 1 % IJ SOLN
30.0000 mL | INTRAMUSCULAR | Status: DC | PRN
  Filled 2018-02-28: qty 30

## 2018-02-28 MED ORDER — MISOPROSTOL 50MCG HALF TABLET
50.0000 ug | ORAL_TABLET | ORAL | Status: DC | PRN
  Administered 2018-02-28 (×2): 50 ug via ORAL
  Filled 2018-02-28 (×3): qty 1

## 2018-02-28 MED ORDER — LACTATED RINGERS IV SOLN
INTRAVENOUS | Status: DC
  Administered 2018-02-28 (×2): via INTRAVENOUS

## 2018-02-28 MED ORDER — ONDANSETRON HCL 4 MG/2ML IJ SOLN
4.0000 mg | Freq: Four times a day (QID) | INTRAMUSCULAR | Status: DC | PRN
  Administered 2018-03-01: 4 mg via INTRAVENOUS
  Filled 2018-02-28: qty 2

## 2018-02-28 MED ORDER — ACETAMINOPHEN 325 MG PO TABS: 650.0000 mg | ORAL_TABLET | ORAL | Status: DC | PRN

## 2018-02-28 MED ORDER — SOD CITRATE-CITRIC ACID 500-334 MG/5ML PO SOLN: 30.0000 mL | ORAL | Status: DC | PRN

## 2018-02-28 MED ORDER — OXYTOCIN BOLUS FROM INFUSION
500.0000 mL | Freq: Once | INTRAVENOUS | Status: AC
  Administered 2018-03-01: 500 mL via INTRAVENOUS

## 2018-02-28 MED ORDER — TERBUTALINE SULFATE 1 MG/ML IJ SOLN
0.2500 mg | Freq: Once | INTRAMUSCULAR | Status: DC | PRN
  Filled 2018-02-28: qty 1

## 2018-02-28 MED ORDER — LACTATED RINGERS IV SOLN: 500.0000 mL | INTRAVENOUS | Status: DC | PRN

## 2018-02-28 MED ORDER — FENTANYL CITRATE (PF) 100 MCG/2ML IJ SOLN
100.0000 ug | INTRAMUSCULAR | Status: DC | PRN
  Administered 2018-02-28: 100 ug via INTRAVENOUS
  Filled 2018-02-28: qty 2

## 2018-02-28 MED ORDER — OXYTOCIN 40 UNITS IN LACTATED RINGERS INFUSION - SIMPLE MED
2.5000 [IU]/h | INTRAVENOUS | Status: DC
  Filled 2018-02-28: qty 1000

## 2018-02-28 MED ORDER — ACYCLOVIR 400 MG PO TABS: 400.0000 mg | ORAL_TABLET | Freq: Three times a day (TID) | ORAL | 3 refills | Status: DC

## 2018-02-28 MED ORDER — ACYCLOVIR 400 MG PO TABS
400.0000 mg | ORAL_TABLET | Freq: Three times a day (TID) | ORAL | Status: DC
  Administered 2018-02-28 (×2): 400 mg via ORAL
  Filled 2018-02-28 (×3): qty 1

## 2018-02-28 NOTE — Progress Notes (Signed)
LOW-RISK PREGNANCY VISIT Patient name: Rebecca [name] MRN [id number]  Date of birth: 08/16/1990 Chief Complaint:   Routine Prenatal Visit  History of Present Illness:   Rebecca [name] is a 27 y.o. [id number] female at [redacted]w[redacted]d with an Estimated Date of Delivery: 03/10/18 being seen today for ongoing management of a low-risk pregnancy.  Today she reports some increase if duration of her normal headaches lately, had 3 day headache the other day that apap relieved only minimally, visual changes only during her normal headaches- which has always been this way, denies ruq/epigastric pain, n/v. Reports she did have elevated bp 'towards end' of both previous pregnancies, and also had elevated bp's non-pregnant before- never dx w/ HTN or been on meds. Contractions: Irregular. Vag. Bleeding: None.  Movement: Present. denies leaking of fluid. Review of Systems:   Pertinent items are noted in HPI Denies abnormal vaginal discharge w/ itching/odor/irritation, headaches, visual changes, shortness of breath, chest pain, abdominal pain, severe nausea/vomiting, or problems with urination or bowel movements unless otherwise stated above. Pertinent History Reviewed:  Reviewed past medical,surgical, social, obstetrical and family history.  Reviewed problem list, medications and allergies. Physical Assessment:   Vitals:   02/28/18 0845  BP: (!) 142/76  Pulse: 95  Weight: 171 lb (77.6 kg)  Body mass index is 29.35 kg/m.        Physical Examination:   General appearance: Well appearing, and in no distress  Mental status: Alert, oriented to person, place, and time  Skin: Warm & dry  Cardiovascular: Normal heart rate noted  Respiratory: Normal respiratory effort, no distress  Abdomen: Soft, gravid, nontender  Pelvic: Cervical exam deferred         Extremities: Edema: Trace, DTRs 2+, no clonus  Fetal Status: Fetal Heart Rate (bpm): 140 Fundal Height: 37 cm Movement: Present Presentation: Vertex by  Leopold's  Results for orders placed or performed in visit on 02/28/18 (from the past 24 hour(s))  POC Urinalysis Dipstick OB   Collection Time: 02/28/18  8:44 AM  Result Value Ref Range   Color, UA     Clarity, UA     Glucose, UA Negative Negative   Bilirubin, UA     Ketones, UA neg    Spec Grav, UA     Blood, UA neg    pH, UA     POC Protein UA Negative Negative, Trace   Urobilinogen, UA     Nitrite, UA neg    Leukocytes, UA Negative Negative   Appearance     Odor      Assessment & Plan:  1) Low-risk pregnancy [id number] at [redacted]w[redacted]d with an Estimated Date of Delivery: 03/10/18   2) GHTN, dx today, bp was 145/75 last week, no proteinuria, some recent changes w/ her normal headaches, otherwise asymptomatic. To [facility] for IOL for GHTN, notified Dr. Earlene Plater   Meds:  Meds ordered this encounter  Medications  . acyclovir (ZOVIRAX) 400 MG tablet    Sig: Take 1 tablet (400 mg total) by mouth 3 (three) times daily.    Dispense:  90 tablet    Refill:  3    Order Specific Question:   Supervising Provider    Answer:   [name] [[id number]]   Labs/procedures today: none  Plan:  To [facility] for IOL  Reviewed: GHTN/pre-e, All questions were answered  Follow-up: Return in about 1 week (around 03/07/2018) for pp bp check.  Orders Placed This Encounter  Procedures  . POC Urinalysis Dipstick OB  Cheral Marker CNM, [facility] 02/28/2018 10:54 AM

## 2018-02-28 NOTE — MAU Note (Signed)
Went in for regular check up. CNM sent her in for induction, for elevated BP.  +HA, increased swelling.  Denies visual changes or epigastric pain.
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
## 2018-02-28 NOTE — Anesthesia Pain Management Evaluation Note (Signed)
  CRNA Pain Management Visit Note  Patient: Rebecca [name], 27 y.o., female  "Hello I am a member of the anesthesia team at [facility]. We have an anesthesia team available at all times to provide care throughout the hospital, including epidural management and anesthesia for C-section. I don't know your plan for the delivery whether it a natural birth, water birth, IV sedation, nitrous supplementation, doula or epidural, but we want to meet your pain goals."   1.Was your pain managed to your expectations on prior hospitalizations?   Yes   2.What is your expectation for pain management during this hospitalization?     Epidural  3.How can we help you reach that goal? Epidural when desired  Record the patient's initial score and the patient's pain goal.   Pain: 5  Pain Goal: 0 The [facility] wants you to be able to say your pain was always managed very well.  Phi Avans 02/28/2018

## 2018-02-28 NOTE — Progress Notes (Signed)
LABOR PROGRESS NOTE  Rebecca [name] is a 27 y.o. [id number] at [redacted]w[redacted]d  admitted for IOL for gHTN.   Subjective: Strip note   Objective: BP 128/77   Pulse 94   Temp 98.5 F (36.9 C) (Oral)   Resp 20   Ht 5\' 4"  (1.626 m)   Wt 77.3 kg   LMP 06/03/2017 (Exact Date)   SpO2 99%   BMI 29.27 kg/m  or  Vitals:   02/28/18 1600 02/28/18 1645 02/28/18 1700 02/28/18 1715  BP: 122/86 133/78 128/77   Pulse: 92 90 94   Resp: 20 20 20    Temp:    98.5 F (36.9 C)  TempSrc:    Oral  SpO2:      Weight:      Height:        Dilation: 1 Effacement (%): 50 Cervical Position: Posterior, Middle Station: -3 Presentation: Vertex Exam by:: Aundria Rud, RN FHT: baseline rate 125, moderate varibility, +acel, no decel Toco: q2-4 min   Labs: Lab Results  Component Value Date   WBC 8.9 02/28/2018   HGB 11.9 (L) 02/28/2018   HCT 35.4 (L) 02/28/2018   MCV 82.9 02/28/2018   PLT 241 02/28/2018    Patient Active Problem List   Diagnosis Date Noted  . Pregnancy 02/28/2018  . History of abnormal cervical Pap smear 08/31/2017  . Chlamydia infection affecting pregnancy in first trimester 08/22/2017  . Rubella non-immune status, antepartum 08/17/2017  . Supervision of normal pregnancy 08/16/2017  . 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 [id number] at [redacted]w[redacted]d here for IOL for gHTN.   GHTN: BPs well controlled. UPC and HELLP labs WNL.  Labor: Induction. FB remains in place. S/p cytotec x1.  Fetal Wellbeing:  Cat I  Pain Control:  Epidural upon request  Anticipated MOD:  NSVD   Marcy Siren, D.O. OB Fellow  02/28/2018, 5:44 PM

## 2018-03-01 ENCOUNTER — Inpatient Hospital Stay (HOSPITAL_COMMUNITY): Payer: Medicaid Other | Admitting: Anesthesiology

## 2018-03-01 DIAGNOSIS — O134 Gestational [pregnancy-induced] hypertension without significant proteinuria, complicating childbirth: Secondary | ICD-10-CM

## 2018-03-01 DIAGNOSIS — Z3A38 38 weeks gestation of pregnancy: Secondary | ICD-10-CM

## 2018-03-01 LAB — CBC
HCT: 34.6 % — ABNORMAL LOW (ref 36.0–46.0)
Hemoglobin: 11.5 g/dL — ABNORMAL LOW (ref 12.0–15.0)
MCH: 27.8 pg (ref 26.0–34.0)
MCHC: 33.2 g/dL (ref 30.0–36.0)
MCV: 83.6 fL (ref 80.0–100.0)
NRBC: 0 % (ref 0.0–0.2)
Platelets: 247 10*3/uL (ref 150–400)
RBC: 4.14 MIL/uL (ref 3.87–5.11)
RDW: 14.1 % (ref 11.5–15.5)
WBC: 13.3 10*3/uL — AB (ref 4.0–10.5)

## 2018-03-01 LAB — RPR: RPR Ser Ql: NONREACTIVE

## 2018-03-01 MED ORDER — BENZOCAINE-MENTHOL 20-0.5 % EX AERO
1.0000 "application " | INHALATION_SPRAY | CUTANEOUS | Status: DC | PRN
  Administered 2018-03-01: 1 via TOPICAL
  Filled 2018-03-01: qty 56

## 2018-03-01 MED ORDER — PRENATAL PLUS 27-1 MG PO TABS
1.0000 | ORAL_TABLET | Freq: Every day | ORAL | Status: DC
  Administered 2018-03-01 – 2018-03-03 (×3): 1 via ORAL
  Filled 2018-03-01 (×3): qty 1

## 2018-03-01 MED ORDER — ONDANSETRON HCL 4 MG/2ML IJ SOLN: 4.0000 mg | INTRAMUSCULAR | Status: DC | PRN

## 2018-03-01 MED ORDER — DIBUCAINE 1 % RE OINT
1.0000 "application " | TOPICAL_OINTMENT | RECTAL | Status: DC | PRN
  Administered 2018-03-01: 1 via RECTAL
  Filled 2018-03-01: qty 28

## 2018-03-01 MED ORDER — DIPHENHYDRAMINE HCL 25 MG PO CAPS: 25.0000 mg | ORAL_CAPSULE | Freq: Four times a day (QID) | ORAL | Status: DC | PRN

## 2018-03-01 MED ORDER — LIDOCAINE HCL (PF) 1 % IJ SOLN
INTRAMUSCULAR | Status: DC | PRN
  Administered 2018-03-01: 13 mL via EPIDURAL

## 2018-03-01 MED ORDER — PANTOPRAZOLE SODIUM 20 MG PO TBEC
20.0000 mg | DELAYED_RELEASE_TABLET | Freq: Every day | ORAL | Status: DC
  Administered 2018-03-01 – 2018-03-03 (×3): 20 mg via ORAL
  Filled 2018-03-01 (×4): qty 1

## 2018-03-01 MED ORDER — SENNOSIDES-DOCUSATE SODIUM 8.6-50 MG PO TABS
2.0000 | ORAL_TABLET | ORAL | Status: DC
  Administered 2018-03-02 – 2018-03-03 (×2): 2 via ORAL
  Filled 2018-03-01 (×2): qty 2

## 2018-03-01 MED ORDER — PRENATAL MULTIVITAMIN CH: 1.0000 | ORAL_TABLET | Freq: Every day | ORAL | Status: DC

## 2018-03-01 MED ORDER — ONDANSETRON HCL 4 MG PO TABS: 4.0000 mg | ORAL_TABLET | ORAL | Status: DC | PRN

## 2018-03-01 MED ORDER — OXYCODONE HCL 5 MG PO TABS
5.0000 mg | ORAL_TABLET | ORAL | Status: DC | PRN
  Administered 2018-03-01: 5 mg via ORAL
  Filled 2018-03-01: qty 1

## 2018-03-01 MED ORDER — ACETAMINOPHEN 325 MG PO TABS
650.0000 mg | ORAL_TABLET | ORAL | Status: DC | PRN
  Administered 2018-03-01: 650 mg via ORAL
  Filled 2018-03-01: qty 2

## 2018-03-01 MED ORDER — WITCH HAZEL-GLYCERIN EX PADS
1.0000 "application " | MEDICATED_PAD | CUTANEOUS | Status: DC | PRN
  Administered 2018-03-01: 1 via TOPICAL

## 2018-03-01 MED ORDER — FENTANYL 2.5 MCG/ML BUPIVACAINE 1/10 % EPIDURAL INFUSION (WH - ANES)
14.0000 mL/h | INTRAMUSCULAR | Status: DC | PRN
  Administered 2018-03-01: 14 mL/h via EPIDURAL
  Filled 2018-03-01: qty 100

## 2018-03-01 MED ORDER — TETANUS-DIPHTH-ACELL PERTUSSIS 5-2.5-18.5 LF-MCG/0.5 IM SUSP: 0.5000 mL | Freq: Once | INTRAMUSCULAR | Status: DC

## 2018-03-01 MED ORDER — OXYTOCIN 40 UNITS IN LACTATED RINGERS INFUSION - SIMPLE MED: 1.0000 m[IU]/min | INTRAVENOUS | Status: DC

## 2018-03-01 MED ORDER — IBUPROFEN 600 MG PO TABS
600.0000 mg | ORAL_TABLET | Freq: Four times a day (QID) | ORAL | Status: DC
  Administered 2018-03-01 – 2018-03-03 (×9): 600 mg via ORAL
  Filled 2018-03-01 (×9): qty 1

## 2018-03-01 MED ORDER — SIMETHICONE 80 MG PO CHEW: 80.0000 mg | CHEWABLE_TABLET | ORAL | Status: DC | PRN

## 2018-03-01 MED ORDER — COCONUT OIL OIL: 1.0000 "application " | TOPICAL_OIL | Status: DC | PRN

## 2018-03-01 MED ORDER — LACTATED RINGERS IV SOLN
500.0000 mL | Freq: Once | INTRAVENOUS | Status: AC
  Administered 2018-03-01: 500 mL via INTRAVENOUS

## 2018-03-01 MED ORDER — ZOLPIDEM TARTRATE 5 MG PO TABS: 5.0000 mg | ORAL_TABLET | Freq: Every evening | ORAL | Status: DC | PRN

## 2018-03-01 NOTE — Anesthesia Procedure Notes (Signed)
Epidural Patient location during procedure: OB Start time: 03/01/2018 1:13 AM End time: 03/01/2018 1:27 AM  Staffing Anesthesiologist: Lowella Curb, MD Performed: anesthesiologist   Preanesthetic Checklist Completed: patient identified, site marked, surgical consent, pre-op evaluation, timeout performed, IV checked, risks and benefits discussed and monitors and equipment checked  Epidural Patient position: sitting Prep: ChloraPrep Patient monitoring: heart rate, cardiac monitor, continuous pulse ox and blood pressure Approach: midline Location: L2-L3 Injection technique: LOR saline  Needle:  Needle type: Tuohy  Needle gauge: 17 G Needle length: 9 cm Needle insertion depth: 6 cm Catheter type: closed end flexible Catheter size: 20 Guage Catheter at skin depth: 10 cm Test dose: negative  Assessment Events: blood not aspirated, injection not painful, no injection resistance, negative IV test and no paresthesia  Additional Notes Reason for block:procedure for pain

## 2018-03-01 NOTE — Anesthesia Preprocedure Evaluation (Signed)
Anesthesia Evaluation  Patient identified by MRN, date of birth, ID band Patient awake    Reviewed: Allergy & Precautions, H&P , NPO status , Patient's Chart, lab work & pertinent test results, reviewed documented beta blocker date and time   History of Anesthesia Complications Negative for: history of anesthetic complications  Airway Mallampati: II  TM Distance: >3 FB Neck ROM: full    Dental  (+) Teeth Intact   Pulmonary neg pulmonary ROS, former smoker,    breath sounds clear to auscultation       Cardiovascular negative cardio ROS   Rhythm:regular Rate:Normal     Neuro/Psych  Headaches, negative psych ROS   GI/Hepatic negative GI ROS, Neg liver ROS,   Endo/Other  negative endocrine ROS  Renal/GU negative Renal ROS  Female GU complaint     Musculoskeletal   Abdominal   Peds  Hematology negative hematology ROS (+)   Anesthesia Other Findings   Reproductive/Obstetrics (+) Pregnancy                             Anesthesia Physical  Anesthesia Plan  ASA: II  Anesthesia Plan: Epidural   Post-op Pain Management:    Induction:   PONV Risk Score and Plan:   Airway Management Planned:   Additional Equipment:   Intra-op Plan:   Post-operative Plan:   Informed Consent: I have reviewed the patients History and Physical, chart, labs and discussed the procedure including the risks, benefits and alternatives for the proposed anesthesia with the patient or authorized representative who has indicated his/her understanding and acceptance.     Plan Discussed with:   Anesthesia Plan Comments:         Anesthesia Quick Evaluation

## 2018-03-01 NOTE — Anesthesia Postprocedure Evaluation (Signed)
Anesthesia Post Note  Patient: Rebecca [name]  Procedure(s) Performed: AN AD HOC LABOR EPIDURAL     Patient location during evaluation: Mother Baby Anesthesia Type: Epidural Level of consciousness: awake and alert Pain management: pain level controlled Vital Signs Assessment: post-procedure vital signs reviewed and stable Respiratory status: spontaneous breathing, nonlabored ventilation and respiratory function stable Cardiovascular status: stable Postop Assessment: no headache, no backache, epidural receding, able to ambulate, adequate PO intake, no apparent nausea or vomiting and patient able to bend at knees Anesthetic complications: no    Last Vitals:  Vitals:   03/01/18 1110 03/01/18 1424  BP: 130/72 126/86  Pulse: 83 89  Resp: 20 20  Temp: 36.8 C 36.7 C  SpO2:      Last Pain:  Vitals:   03/01/18 1424  TempSrc: Oral  PainSc: 7    Pain Goal: Patients Stated Pain Goal: 2 (03/01/18 0500)               Laban Emperor

## 2018-03-01 NOTE — Progress Notes (Signed)
LABOR PROGRESS NOTE  Rebecca [name] is a 27 y.o. [phone] at [redacted]w[redacted]d  admitted for IOL for gHTN.   Subjective: Strip note   Objective: BP 127/60   Pulse (!) 110   Temp 98.4 F (36.9 C) (Oral)   Resp 18   Ht 5\' 4"  (1.626 m)   Wt 77.3 kg   LMP 06/03/2017 (Exact Date)   SpO2 99%   BMI 29.27 kg/m  or  Vitals:   03/01/18 0311 03/01/18 0332 03/01/18 0402 03/01/18 0432  BP:  133/61 (!) 115/50 127/60  Pulse:  (!) 115 (!) 106 (!) 110  Resp:  18    Temp:  98.4 F (36.9 C)    TempSrc:  Oral    SpO2: 99%     Weight:      Height:        Dilation: 5.5 Effacement (%): 80 Cervical Position: Middle, Posterior Station: -2 Presentation: Vertex Exam by:: L.Stubbs, RN FHT: baseline rate 140s, moderate varibility, +acel, questionable infrequent variable decel vs mom's HR  Toco: irritable, difficult to assess   Labs: Lab Results  Component Value Date   WBC 13.3 (H) 03/01/2018   HGB 11.5 (L) 03/01/2018   HCT 34.6 (L) 03/01/2018   MCV 83.6 03/01/2018   PLT 247 03/01/2018    Patient Active Problem List   Diagnosis Date Noted  . Pregnancy 02/28/2018  . History of abnormal cervical Pap smear 08/31/2017  . Chlamydia infection affecting pregnancy in first trimester 08/22/2017  . Rubella non-immune status, antepartum 08/17/2017  . Supervision of normal pregnancy 08/16/2017  . 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 [id number] at [redacted]w[redacted]d here for IOL d/t gHTN. GBS negative.   Labor: IOL. SROM @ 0315 with clear fluid, with associated cervical change. Consideration for IUPC placement + pit if minimal progression with next evaluation. Difficult to assess contractions on toco currently.   Fetal Wellbeing:  Cat 1  Pain Control:  Epidural  Anticipated MOD:  NSVD  1. GHTN: Stable, last BP 127/60. Will cont to monitor BP.   Leticia Penna, D.O. Family Medicine PGY-1  03/01/2018, 4:54 AM

## 2018-03-01 NOTE — Progress Notes (Signed)
LABOR PROGRESS NOTE  Rebecca [name] is a 27 y.o. [phone] at [redacted]w[redacted]d  admitted for IOL for gHTN.   Subjective: Doing well, feeling more intense contractions. Ready for her epidural.   Objective: BP 135/86   Pulse (!) 110   Temp 98.3 F (36.8 C) (Oral)   Resp 20   Ht 5\' 4"  (1.626 m)   Wt 77.3 kg   LMP 06/03/2017 (Exact Date)   SpO2 99%   BMI 29.27 kg/m  or  Vitals:   02/28/18 2332 03/01/18 0000 03/01/18 0002 03/01/18 0032  BP: (!) 153/83 135/78 135/78 135/86  Pulse: (!) 102 (!) 103 (!) 103 (!) 110  Resp:   20   Temp:   98.3 F (36.8 C)   TempSrc:   Oral   SpO2:      Weight:      Height:        Dilation: 4 Effacement (%): 60 Cervical Position: Posterior Station: -3 Presentation: Vertex Exam by:: L.Stubbs, RN FHT: baseline rate 130, moderate varibility, +acel, -decel Toco: Difficult to assess, approx every 4 min   Labs: Lab Results  Component Value Date   WBC 13.3 (H) 03/01/2018   HGB 11.5 (L) 03/01/2018   HCT 34.6 (L) 03/01/2018   MCV 83.6 03/01/2018   PLT 247 03/01/2018    Patient Active Problem List   Diagnosis Date Noted  . Pregnancy 02/28/2018  . History of abnormal cervical Pap smear 08/31/2017  . Chlamydia infection affecting pregnancy in first trimester 08/22/2017  . Rubella non-immune status, antepartum 08/17/2017  . Supervision of normal pregnancy 08/16/2017  . 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 [id number] at [redacted]w[redacted]d here for IOL d/t gHTN.   Labor: IOL, contracting well, likely start pit if not appropriate progress on next check in 1-2 hrs. Consideration for AROM and IUPC depending on exam.   Fetal Wellbeing:  Cat 1 strip  Pain Control:  Epidural-getting placed soon Anticipated MOD:  NSVD   1. GHTN: Stable, last BP 135/86. LFTs, Cr, and plts wnl. UPC wnl.  -Cont to monitor BP   Leticia Penna, D.O. FM PGY-1  03/01/2018, 1:00 AM

## 2018-03-01 NOTE — Lactation Note (Signed)
This note was copied from a baby's chart. Lactation Consultation Note Baby 7 hrs old. Mom BF her 1st child for 2 months w/o difficulty. Mom has semi flat Rt. Nipple, very short shaft Lt. Nipple. Shells given for mom to wear. Hand pump given to pre-pump prior to latching, also since baby is sleepy not interested in BF for stimulation. Hand expression demonstrated collecting 2 ml colostrum. Spoon fed baby. Baby needed stimulation to take colostrum. Mom encouraged to feed baby 8-12 times/24 hours and with feeding cues. Mom encouraged to waken baby for feeds if hasn't cued in 3 hrs. Educated about newborn behavior, STS, I&O, supply and demand. Encouraged mom to call for assistance or questions. WH/LC brochure given w/resources, support groups and LC services.  Patient Name: Rebecca [name] [id number] Date: 03/01/2018 Reason for consult: Initial assessment;Early term 37-38.6wks   Maternal Data Has patient been taught Hand Expression?: Yes Does the patient have breastfeeding experience prior to this delivery?: Yes  Feeding Feeding Type: Breast Milk  LATCH Score Latch: Too sleepy or reluctant, no latch achieved, no sucking elicited.     Type of Nipple: Flat(semi flat)  Comfort (Breast/Nipple): Soft / non-tender        Interventions Interventions: Breast feeding basics reviewed;Skin to skin;Expressed milk;Breast massage;Hand express;Shells;Hand pump;Pre-pump if needed;Breast compression  Lactation Tools Discussed/Used Tools: Shells;Pump Shell Type: Inverted Breast pump type: Manual WIC Program: Yes Pump Review: Setup, frequency, and cleaning;Milk Storage Initiated by:: Peri Jefferson RN IBCLC Date initiated:: 03/01/18   Consult Status Consult Status: Follow-up Date: 03/02/18 Follow-up type: In-patient    Vivek Grealish, Diamond Nickel 03/01/2018, 2:10 PM

## 2018-03-02 LAB — CBC
HCT: 29.7 % — ABNORMAL LOW (ref 36.0–46.0)
HEMOGLOBIN: 10 g/dL — AB (ref 12.0–15.0)
MCH: 28 pg (ref 26.0–34.0)
MCHC: 33.7 g/dL (ref 30.0–36.0)
MCV: 83.2 fL (ref 80.0–100.0)
Platelets: 221 10*3/uL (ref 150–400)
RBC: 3.57 MIL/uL — AB (ref 3.87–5.11)
RDW: 14.2 % (ref 11.5–15.5)
WBC: 14.2 10*3/uL — AB (ref 4.0–10.5)
nRBC: 0 % (ref 0.0–0.2)

## 2018-03-02 MED ORDER — TRAMADOL HCL 50 MG PO TABS: 50.0000 mg | ORAL_TABLET | Freq: Four times a day (QID) | ORAL | Status: DC | PRN

## 2018-03-02 MED ORDER — PNEUMOCOCCAL VAC POLYVALENT 25 MCG/0.5ML IJ INJ
0.5000 mL | INJECTION | Freq: Once | INTRAMUSCULAR | Status: AC
  Administered 2018-03-02: 0.5 mL via INTRAMUSCULAR
  Filled 2018-03-02: qty 0.5

## 2018-03-02 NOTE — Progress Notes (Signed)
Post Partum Day 1 Subjective: no complaints, up ad lib, voiding and tolerating PO, small lochia, plans to breastfeed, plans to bottle feed, Depo-Provera Cramps really bad Objective: Blood pressure 118/71, pulse 78, temperature 97.7 F (36.5 C), temperature source Oral, resp. rate 18, height 5\' 4"  (1.626 m), weight 77.3 kg, last menstrual period 06/03/2017, SpO2 99 %.  Physical Exam:  General: alert, cooperative and no distress Lochia:normal flow Chest: CTAB Heart: RRR no m/r/g Abdomen: +BS, soft, nontender,  Uterine Fundus: firm DVT Evaluation: No evidence of DVT seen on physical exam. Extremities: trace edema  Recent Labs    03/01/18 0012 03/02/18 0542  HGB 11.5* 10.0*  HCT 34.6* 29.7*    Assessment/Plan: Plan for discharge tomorrow Rx tramadol for today  LOS: 2 days   Jacklyn Shell 03/02/2018, 7:33 AM

## 2018-03-03 ENCOUNTER — Encounter (HOSPITAL_COMMUNITY): Payer: Self-pay | Admitting: *Deleted

## 2018-03-03 DIAGNOSIS — Z8759 Personal history of other complications of pregnancy, childbirth and the puerperium: Secondary | ICD-10-CM

## 2018-03-03 MED ORDER — MEASLES, MUMPS & RUBELLA VAC ~~LOC~~ INJ
0.5000 mL | INJECTION | Freq: Once | SUBCUTANEOUS | Status: AC
  Administered 2018-03-03: 0.5 mL via SUBCUTANEOUS
  Filled 2018-03-03 (×2): qty 0.5

## 2018-03-03 NOTE — Discharge Instructions (Signed)
You should receive a call to schedule your postpartum follow-up visit. If you do not receive a call, please call clinic in 4-6 weeks for a postpartum check.  ° °Taking care of yourself after Baby arrives… °1. Vaginal Bleeding: Vaginal bleeding is common after delivery, with the amount decreasing gradually over 1-2 weeks. If the bleeding increases, is mixed with pus, or is foul-smelling, call your doctor, as this may be a sign of infection. ° °2. Abdominal Pain: Abdominal cramping after delivery is common, especially when you breastfeed. The same hormones responsible for letting milk down to your nipple also contract your uterus. If the pain worsens, or occurs more frequently over 48 hrs after delivery, call your doctor. ° °3. Fevers: After delivery you are at increased risk of developing an infection. If you have a fever, increased vaginal bleeding, foul-smelling vaginal discharge, or increased abdominal pain, call your doctor. ° °4. Breast Feeding: Feeding every 1.5-3 hours keeps Baby satisfied and your milk in good supply. If 3 hours have gone by and Baby is sleeping, wake him/her up to feed. Nurse for 15-20 minutes on one breast before offering the other. Breast-fed babies often lose up to 7% of their birth weight in the first few days of life, but should start gaining about an ounce per day after 4 days.  ° °5. Mastitis (Breast infection): Breaks in the skin or bacteria passing into your breast ducts can cause an infection. If you notice a triangular shaped area on your breast that is red, warm to the touch and tender, call your doctor. It is safe for Baby and helps you to heal faster if you keep breast feeding through this infection. ° °6. Postpartum Depression: Postpartum depression is very common after a woman delivers because of all the hormonal changes happening in her body. If you notice that you start to feel more sad or anxious than usual or have any thoughts of hurting yourself or Baby, tell someone  right away. ° °If you have any questions or concerns, please call your doctor.  ° °

## 2018-03-03 NOTE — Discharge Summary (Signed)
OB Discharge Summary     Patient Name: Rebecca [name] DOB: 1991/04/15 MRN: [id number]  Date of admission: 02/28/2018 Delivering MD: Duane Lope H   Date of discharge: 03/03/2018  Admitting diagnosis: 38WKS ELEVATED BP Intrauterine pregnancy: [redacted]w[redacted]d     Secondary diagnosis:  Principal Problem:   Pregnancy Active Problems:   HSV-2 seropositive   Rubella non-immune status, antepartum   Chlamydia infection affecting pregnancy in first trimester   History of abnormal cervical Pap smear   Gestational hypertension     Discharge diagnosis: Term Pregnancy Delivered                                                                                                Post partum procedures:none  Augmentation: Cytotec, Foley Balloon and induction of labor for gestational hypertension  Complications: None  Hospital course:  Induction of Labor With Vaginal Delivery   27 y.o. yo [id number] at [redacted]w[redacted]d was admitted to the hospital 02/28/2018 for induction of labor.  Indication for induction: Gestational hypertension.  Patient had an uncomplicated labor course as follows: Membrane Rupture Time/Date: 3:15 AM ,03/01/2018   Intrapartum Procedures: Episiotomy: None [1]                                         Lacerations:  None [1]  Patient had delivery of a Viable infant.  Information for the patient's newborn:  Naydeline, Morace [[id number]]  Delivery Method: Vag-Spont   03/01/2018  Details of delivery can be found in separate delivery note.  Patient had a routine postpartum course. Patient is discharged home 03/03/18.  Physical exam  Vitals:   03/02/18 0900 03/02/18 1336 03/02/18 2142 03/03/18 0526  BP: 128/86 125/86 (!) 145/93 120/67  Pulse: 79 70 75 65  Resp: 12 12 16 16   Temp: 98.2 F (36.8 C) 97.8 F (36.6 C) 98.4 F (36.9 C) 98.1 F (36.7 C)  TempSrc: Oral Oral Oral Oral  SpO2:      Weight:      Height:       General: alert, cooperative and no distress Lochia:  appropriate Uterine Fundus: firm Incision: N/A DVT Evaluation: No evidence of DVT seen on physical exam. Labs: Lab Results  Component Value Date   WBC 14.2 (H) 03/02/2018   HGB 10.0 (L) 03/02/2018   HCT 29.7 (L) 03/02/2018   MCV 83.2 03/02/2018   PLT 221 03/02/2018   CMP Latest Ref Rng & Units 02/28/2018  Glucose 70 - 99 mg/dL 78  BUN 6 - 20 mg/dL 9  Creatinine 1.30 - 8.65 mg/dL 7.84  Sodium 696 - 295 mmol/L 136  Potassium 3.5 - 5.1 mmol/L 3.7  Chloride 98 - 111 mmol/L 105  CO2 22 - 32 mmol/L 20(L)  Calcium 8.9 - 10.3 mg/dL 9.1  Total Protein 6.5 - 8.1 g/dL 6.7  Total Bilirubin 0.3 - 1.2 mg/dL 0.5  Alkaline Phos 38 - 126 U/L 95  AST 15 - 41 U/L 18  ALT 0 - 44 U/L 14  Discharge instruction: per After Visit Summary and "Baby and Me Booklet".  After visit meds:  Allergies as of 03/03/2018      Reactions   Nyquil [pseudoeph-doxylamine-dm-apap] Hives, Itching, Nausea And Vomiting   Itching throat, but pt had no difficulty breathing.      Medication List    STOP taking these medications   promethazine 25 MG tablet Commonly known as:  PHENERGAN     TAKE these medications   pantoprazole 20 MG tablet Commonly known as:  PROTONIX Take 1 tablet (20 mg total) by mouth daily.   prenatal vitamin w/FE, FA 27-1 MG Tabs tablet Take 1 tablet by mouth daily at 12 noon.       Diet: routine diet  Activity: Advance as tolerated. Pelvic rest for 6 weeks.   Outpatient follow up:1 week for BP check, then 4 weeks Follow up Appt: Future Appointments  Date Time Provider Department Center  03/07/2018 10:30 AM [facility] NURSE TECH [facility]  04/05/2018  9:00 AM Cheral Marker, CNM [facility]   Follow up Visit:No follow-ups on file.  Postpartum contraception: Depo Provera  Newborn Data: Live born female  Birth Weight: 6 lb 8 oz (2948 g) APGAR: 6, 7  Newborn Delivery   Birth date/time:  03/01/2018 06:43:00 Delivery type:  Vaginal, Spontaneous      Baby Feeding: both Disposition:home with mother   03/03/2018 Ted Mcalpine, MD

## 2018-03-07 ENCOUNTER — Ambulatory Visit (INDEPENDENT_AMBULATORY_CARE_PROVIDER_SITE_OTHER): Payer: Medicaid Other | Admitting: *Deleted

## 2018-03-07 ENCOUNTER — Encounter: Payer: Self-pay | Admitting: *Deleted

## 2018-03-07 VITALS — BP 130/79 | HR 66 | Wt 161.6 lb

## 2018-03-07 DIAGNOSIS — Z0130 Encounter for examination of blood pressure without abnormal findings: Secondary | ICD-10-CM

## 2018-03-07 NOTE — Progress Notes (Signed)
No problems or complaints today.  Patient is checking BP at home.  Advised to let us know if she has any visual disturbances, headaches unresolved by Tylenol.  Patient is checking BP at home.  Return as scheduled.

## 2018-03-15 ENCOUNTER — Encounter: Payer: Self-pay | Admitting: *Deleted

## 2018-03-15 ENCOUNTER — Telehealth: Payer: Self-pay | Admitting: *Deleted

## 2018-03-15 ENCOUNTER — Ambulatory Visit (INDEPENDENT_AMBULATORY_CARE_PROVIDER_SITE_OTHER): Payer: Medicaid Other | Admitting: *Deleted

## 2018-03-15 VITALS — BP 149/88 | HR 83

## 2018-03-15 DIAGNOSIS — Z0130 Encounter for examination of blood pressure without abnormal findings: Secondary | ICD-10-CM

## 2018-03-15 NOTE — Progress Notes (Signed)
BP check.  Patient states she checks her BP at home and ranges from 130-140/80-90.  Has had headache for the past 3 days and occasional blurred vision.  Discussed with Dr Despina Hidden and no medications needed at this time.  Advised to continue checking BP and if continues to rise to let us know.  Verbalized understanding.

## 2018-03-15 NOTE — Telephone Encounter (Signed)
Postpartum nurse called stating she was with the patient and her BP is elevated.  Readings have been 150/120 and 140/100. Son is scheduled for circ today at 1:30.  Will get BP check on patient when she comes in today.

## 2018-04-05 ENCOUNTER — Encounter: Payer: Self-pay | Admitting: *Deleted

## 2018-04-05 ENCOUNTER — Encounter: Payer: Self-pay | Admitting: Women's Health

## 2018-04-05 ENCOUNTER — Ambulatory Visit (INDEPENDENT_AMBULATORY_CARE_PROVIDER_SITE_OTHER): Payer: Medicaid Other | Admitting: Women's Health

## 2018-04-05 ENCOUNTER — Encounter (INDEPENDENT_AMBULATORY_CARE_PROVIDER_SITE_OTHER): Payer: Self-pay

## 2018-04-05 ENCOUNTER — Ambulatory Visit (INDEPENDENT_AMBULATORY_CARE_PROVIDER_SITE_OTHER): Payer: Medicaid Other | Admitting: *Deleted

## 2018-04-05 DIAGNOSIS — Z3042 Encounter for surveillance of injectable contraceptive: Secondary | ICD-10-CM | POA: Diagnosis not present

## 2018-04-05 DIAGNOSIS — Z30013 Encounter for initial prescription of injectable contraceptive: Secondary | ICD-10-CM

## 2018-04-05 DIAGNOSIS — Z3202 Encounter for pregnancy test, result negative: Secondary | ICD-10-CM | POA: Diagnosis not present

## 2018-04-05 DIAGNOSIS — K5900 Constipation, unspecified: Secondary | ICD-10-CM

## 2018-04-05 DIAGNOSIS — Z308 Encounter for other contraceptive management: Secondary | ICD-10-CM

## 2018-04-05 LAB — POCT URINE PREGNANCY: PREG TEST UR: NEGATIVE

## 2018-04-05 MED ORDER — MEDROXYPROGESTERONE ACETATE 150 MG/ML IM SUSP
150.0000 mg | Freq: Once | INTRAMUSCULAR | Status: AC
  Administered 2018-04-05: 150 mg via INTRAMUSCULAR

## 2018-04-05 MED ORDER — MEDROXYPROGESTERONE ACETATE 150 MG/ML IM SUSP: 150.0000 mg | INTRAMUSCULAR | 3 refills | Status: DC

## 2018-04-05 NOTE — Patient Instructions (Addendum)
Constipation  Drink plenty of fluid, preferably water, throughout the day  Eat foods high in fiber such as fruits, vegetables, and grains  Exercise, such as walking, is a good way to keep your bowels regular  Drink warm fluids, especially warm prune juice, or decaf coffee  Eat a 1/2 cup of real oatmeal (not instant), 1/2 cup applesauce, and 1/2-1 cup warm prune juice every day  If needed, you may take Colace (docusate sodium) stool softener once or twice a day to help keep the stool soft. If you are pregnant, wait until you are out of your first trimester (12-14 weeks of pregnancy)  If you still are having problems with constipation, you may take Miralax once daily as needed to help keep your bowels regular.  If you are pregnant, wait until you are out of your first trimester (12-14 weeks of pregnancy)    Medroxyprogesterone injection [Contraceptive] What is this medicine? MEDROXYPROGESTERONE (me DROX ee proe JES te rone) contraceptive injections prevent pregnancy. They provide effective birth control for 3 months. Depo-subQ Provera 104 is also used for treating pain related to endometriosis. This medicine may be used for other purposes; ask your health care provider or pharmacist if you have questions. COMMON BRAND NAME(S): Depo-Provera, Depo-subQ Provera 104 What should I tell my health care provider before I take this medicine? They need to know if you have any of these conditions: -frequently drink alcohol -asthma -blood vessel disease or a history of a blood clot in the lungs or legs -bone disease such as osteoporosis -breast cancer -diabetes -eating disorder (anorexia nervosa or bulimia) -high blood pressure -HIV infection or AIDS -kidney disease -liver disease -mental depression -migraine -seizures (convulsions) -stroke -tobacco smoker -vaginal bleeding -an unusual or allergic reaction to medroxyprogesterone, other hormones, medicines, foods, dyes, or  preservatives -pregnant or trying to get pregnant -breast-feeding How should I use this medicine? Depo-Provera Contraceptive injection is given into a muscle. Depo-subQ Provera 104 injection is given under the skin. These injections are given by a health care professional. You must not be pregnant before getting an injection. The injection is usually given during the first 5 days after the start of a menstrual period or 6 weeks after delivery of a baby. Talk to your pediatrician regarding the use of this medicine in children. Special care may be needed. These injections have been used in female children who have started having menstrual periods. Overdosage: If you think you have taken too much of this medicine contact a poison control center or emergency room at once. NOTE: This medicine is only for you. Do not share this medicine with others. What if I miss a dose? Try not to miss a dose. You must get an injection once every 3 months to maintain birth control. If you cannot keep an appointment, call and reschedule it. If you wait longer than 13 weeks between Depo-Provera contraceptive injections or longer than 14 weeks between Depo-subQ Provera 104 injections, you could get pregnant. Use another method for birth control if you miss your appointment. You may also need a pregnancy test before receiving another injection. What may interact with this medicine? Do not take this medicine with any of the following medications: -bosentan This medicine may also interact with the following medications: -aminoglutethimide -antibiotics or medicines for infections, especially rifampin, rifabutin, rifapentine, and griseofulvin -aprepitant -barbiturate medicines such as phenobarbital or primidone -bexarotene -carbamazepine -medicines for seizures like ethotoin, felbamate, oxcarbazepine, phenytoin, topiramate -modafinil -St. John's wort This list may not describe all  possible interactions. Give your health  care provider a list of all the medicines, herbs, non-prescription drugs, or dietary supplements you use. Also tell them if you smoke, drink alcohol, or use illegal drugs. Some items may interact with your medicine. What should I watch for while using this medicine? This drug does not protect you against HIV infection (AIDS) or other sexually transmitted diseases. Use of this product may cause you to lose calcium from your bones. Loss of calcium may cause weak bones (osteoporosis). Only use this product for more than 2 years if other forms of birth control are not right for you. The longer you use this product for birth control the more likely you will be at risk for weak bones. Ask your health care professional how you can keep strong bones. You may have a change in bleeding pattern or irregular periods. Many females stop having periods while taking this drug. If you have received your injections on time, your chance of being pregnant is very low. If you think you may be pregnant, see your health care professional as soon as possible. Tell your health care professional if you want to get pregnant within the next year. The effect of this medicine may last a long time after you get your last injection. What side effects may I notice from receiving this medicine? Side effects that you should report to your doctor or health care professional as soon as possible: -allergic reactions like skin rash, itching or hives, swelling of the face, lips, or tongue -breast tenderness or discharge -breathing problems -changes in vision -depression -feeling faint or lightheaded, falls -fever -pain in the abdomen, chest, groin, or leg -problems with balance, talking, walking -unusually weak or tired -yellowing of the eyes or skin Side effects that usually do not require medical attention (report to your doctor or health care professional if they continue or are bothersome): -acne -fluid retention and  swelling -headache -irregular periods, spotting, or absent periods -temporary pain, itching, or skin reaction at site where injected -weight gain This list may not describe all possible side effects. Call your doctor for medical advice about side effects. You may report side effects to FDA at 1-800-FDA-1088. Where should I keep my medicine? This does not apply. The injection will be given to you by a health care professional. NOTE: This sheet is a summary. It may not cover all possible information. If you have questions about this medicine, talk to your doctor, pharmacist, or health care provider.  2018 [employer]vier/Gold Standard (2008-05-23 18:37:56)

## 2018-04-05 NOTE — Progress Notes (Signed)
POSTPARTUM VISIT Patient name: Rebecca [name] MRN [id number]  Date of birth: 1990/08/10 Chief Complaint:   Postpartum Care  History of Present Illness:   Rebecca [name] is a 27 y.o. [phone] African American female being seen today for a postpartum visit. She is 4 weeks postpartum following a spontaneous vaginal delivery at 38.5 gestational weeks after IOL for GHTN. Anesthesia: epidural. Laceration: none. I have fully reviewed the prenatal and intrapartum course. Pregnancy complicated by GHTN. Postpartum course has been uncomplicated. Bleeding on period. Bowel function is constipation, hasn't tried anything. Bladder function is normal.  Patient is not sexually active. Last sexual activity: prior to birth of baby.  Contraception method is Depo-Provera injections.  [place] Postpartum Depression Screening: negative. Score 0.   Last pap 08/16/17.  Results were normal .  Patient's last menstrual period was 04/03/2018.  Baby's course has been uncomplicated. Baby is feeding by bottle.  Review of Systems:   Pertinent items are noted in HPI Denies Abnormal vaginal discharge w/ itching/odor/irritation, headaches, visual changes, shortness of breath, chest pain, abdominal pain, severe nausea/vomiting, or problems with urination or bowel movements. Pertinent History Reviewed:  Reviewed past medical,surgical, obstetrical and family history.  Reviewed problem list, medications and allergies. OB History  Gravida Para Term Preterm AB Living  4 2 2   1 2   SAB TAB Ectopic Multiple Live Births  1       2    # Outcome Date GA Lbr Len/2nd Weight Sex Delivery Anes PTL Lv  4 Gravida           3 Term 05/31/13 [redacted]w[redacted]d 09:50 / 00:04 6 lb 15.3 oz (3.155 kg) F Vag-Spont EPI N LIV  2 Term 04/25/12 [redacted]w[redacted]d 20:30 / 00:17 5 lb 11 oz (2.58 kg) M Vag-Vacuum EPI N LIV  1 SAB 2012           Physical Assessment:   Vitals:   04/05/18 0912  BP: 128/77  Pulse: 79  Weight: 169 lb 8 oz (76.9 kg)  Height: 5\' 3"  (1.6  m)  Body mass index is 30.03 kg/m.       Physical Examination:   General appearance: alert, well appearing, and in no distress  Mental status: alert, oriented to person, place, and time  Skin: warm & dry   Cardiovascular: normal heart rate noted   Respiratory: normal respiratory effort, no distress   Breasts: deferred, no complaints   Abdomen: soft, non-tender   Pelvic: VULVA: normal appearing vulva with no masses, tenderness or lesions, UTERUS: uterus is normal size, shape, consistency and nontender  Rectal: no hemorrhoids  Extremities: no edema       No results found for this or any previous visit (from the past 24 hour(s)).  Assessment & Plan:  1) Postpartum exam 2) 4 wks s/p SVB after IOL for GHTN 3) Bottlefeeding 4) Depression screening 5) Contraception counseling, pt prefers Depo-Provera injections  6) Constipation> gave printed prevention/relief measures   Meds:  Meds ordered this encounter  Medications  . medroxyPROGESTERone (DEPO-PROVERA) 150 MG/ML injection    Sig: Inject 1 mL (150 mg total) into the muscle every 3 (three) months.    Dispense:  1 mL    Refill:  3    Order Specific Question:   Supervising Provider    Answer:   [name] [[id number]]    Follow-up: Return for Depo injection this afternoon; then physical after 4/3.   No orders of the defined types were placed in this encounter.  Cheral MarkerKimberly R Khyrie Masi CNM, [facility]NP-BC 04/05/2018 9:40 AM

## 2018-04-05 NOTE — Progress Notes (Signed)
Pt here for Depo. Pt received shot in left hip. Pt tolerated shot well. Return in 12 weeks for next shot. JSY 

## 2018-06-28 ENCOUNTER — Ambulatory Visit: Payer: Medicaid Other

## 2018-06-29 ENCOUNTER — Other Ambulatory Visit: Payer: Self-pay

## 2018-06-29 ENCOUNTER — Ambulatory Visit (INDEPENDENT_AMBULATORY_CARE_PROVIDER_SITE_OTHER): Payer: Medicaid Other | Admitting: *Deleted

## 2018-06-29 ENCOUNTER — Encounter: Payer: Self-pay | Admitting: *Deleted

## 2018-06-29 DIAGNOSIS — Z3202 Encounter for pregnancy test, result negative: Secondary | ICD-10-CM

## 2018-06-29 DIAGNOSIS — Z3042 Encounter for surveillance of injectable contraceptive: Secondary | ICD-10-CM

## 2018-06-29 LAB — POCT URINE PREGNANCY: Preg Test, Ur: NEGATIVE

## 2018-06-29 MED ORDER — MEDROXYPROGESTERONE ACETATE 150 MG/ML IM SUSP
150.0000 mg | Freq: Once | INTRAMUSCULAR | Status: AC
  Administered 2018-06-29: 150 mg via INTRAMUSCULAR

## 2018-06-29 NOTE — Progress Notes (Signed)
Pt given DepoProvera 150mg  IM right VG without complications. Advised to return in 12 weeks for next injection. Pt states that she will be receiving future injections at the health dept.

## 2018-08-20 ENCOUNTER — Other Ambulatory Visit: Payer: Self-pay | Admitting: Women's Health

## 2019-06-07 ENCOUNTER — Ambulatory Visit: Payer: Self-pay

## 2019-06-07 NOTE — Telephone Encounter (Signed)
Urgent Incoming call from Patient  With complaint of having an bleeding hemorrhoid.  Not itching, rate pain Rates pain as moderate to severe .  Small amount moderate amount of bleeding.   Denies bleeding.  Providing, instruction of how to use Preparation pads. .   Voiced  Understanding.  If no relief seek  Urgent Care.  Patient voiced understanding.                                                                                                                                                            Reason for Disposition . Large mass protruding out of rectum  Answer Assessment - Initial Assessment Questions 1. SYMPTOM:  "What's the main symptom you're concerned about?" (e.g., pain, itching, swelling, rash)     2. ONSET: "When did the *No Answer*  start?"     *No Answer* 3. RECTAL PAIN: "Do you have any pain around your rectum?" "How bad is the pain?"  (Scale 1-10; or mild, moderate, severe)  - MILD (1-3): doesn't interfere with normal activities   - MODERATE (4-7): interferes with normal activities or awakens from sleep, limping   - SEVERE (8-10): excruciating pain, unable to have a bowel movement      *No Answer* 4. RECTAL ITCHING: "Do you have any itching in this area?" "How bad is the itching?"  (Scale 1-10; or mild, moderate, severe)  - MILD - doesn't interfere with normal activities   - MODERATE-SEVERE: interferes with normal activities or awakens from sleep     Moderate  5. CONSTIPATION: "Do you have constipation?" If so, "How bad is it?"     denies6. CAUSE: "What do you think is causing the anus symptoms?"     *No Answer* 7. OTHER SYMPTOMS: "Do you have any other symptoms?"  (e.g., rectal bleeding, abdominal pain, vomiting, fever)    Small amount of bleeding.   8. PREGNANCY: "Is there any chance you are pregnant?" "When was your last menstrual period?"   Denies pregnancy.  Protocols used: RECTAL [facility]

## 2019-07-02 ENCOUNTER — Ambulatory Visit: Payer: Self-pay | Attending: Internal Medicine

## 2019-07-02 ENCOUNTER — Other Ambulatory Visit: Payer: Self-pay

## 2019-07-02 DIAGNOSIS — Z20822 Contact with and (suspected) exposure to covid-19: Secondary | ICD-10-CM | POA: Insufficient documentation

## 2019-07-03 LAB — NOVEL CORONAVIRUS, NAA: SARS-CoV-2, NAA: NOT DETECTED

## 2019-09-12 ENCOUNTER — Other Ambulatory Visit: Payer: Self-pay

## 2019-09-12 ENCOUNTER — Ambulatory Visit: Payer: Self-pay | Attending: Internal Medicine

## 2019-09-12 DIAGNOSIS — Z20822 Contact with and (suspected) exposure to covid-19: Secondary | ICD-10-CM | POA: Insufficient documentation

## 2019-09-13 LAB — NOVEL CORONAVIRUS, NAA: SARS-CoV-2, NAA: NOT DETECTED

## 2019-09-13 LAB — SARS-COV-2, NAA 2 DAY TAT

## 2020-01-23 ENCOUNTER — Other Ambulatory Visit: Payer: Self-pay

## 2020-01-23 DIAGNOSIS — Z20822 Contact with and (suspected) exposure to covid-19: Secondary | ICD-10-CM

## 2020-01-25 LAB — SARS-COV-2, NAA 2 DAY TAT

## 2020-01-25 LAB — NOVEL CORONAVIRUS, NAA: SARS-CoV-2, NAA: NOT DETECTED

## 2020-02-10 ENCOUNTER — Other Ambulatory Visit: Payer: Self-pay

## 2020-02-20 ENCOUNTER — Other Ambulatory Visit: Payer: Self-pay

## 2020-02-20 DIAGNOSIS — Z20822 Contact with and (suspected) exposure to covid-19: Secondary | ICD-10-CM

## 2020-02-21 LAB — NOVEL CORONAVIRUS, NAA: SARS-CoV-2, NAA: NOT DETECTED

## 2020-02-21 LAB — SARS-COV-2, NAA 2 DAY TAT

## 2020-10-11 ENCOUNTER — Emergency Department (HOSPITAL_COMMUNITY)
Admission: EM | Admit: 2020-10-11 | Discharge: 2020-10-11 | Disposition: A | Discharge status: Home / Self Care | Payer: Self-pay | Attending: Emergency Medicine | Admitting: Emergency Medicine

## 2020-10-11 ENCOUNTER — Encounter (HOSPITAL_COMMUNITY): Payer: Self-pay | Admitting: *Deleted

## 2020-10-11 ENCOUNTER — Other Ambulatory Visit: Payer: Self-pay

## 2020-10-11 DIAGNOSIS — Z87891 Personal history of nicotine dependence: Secondary | ICD-10-CM | POA: Insufficient documentation

## 2020-10-11 DIAGNOSIS — Z20822 Contact with and (suspected) exposure to covid-19: Secondary | ICD-10-CM | POA: Insufficient documentation

## 2020-10-11 DIAGNOSIS — J029 Acute pharyngitis, unspecified: Secondary | ICD-10-CM | POA: Insufficient documentation

## 2020-10-11 LAB — RESP PANEL BY RT-PCR (FLU A&B, COVID) ARPGX2
Influenza A by PCR: NEGATIVE
Influenza B by PCR: NEGATIVE
SARS Coronavirus 2 by RT PCR: NEGATIVE

## 2020-10-11 LAB — GROUP A STREP BY PCR: Group A Strep by PCR: NOT DETECTED

## 2020-10-11 NOTE — Discharge Instructions (Addendum)
Tylenol every 4 hours.  Return if any problems.  Covid and flu are pending
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
## 2020-10-11 NOTE — ED Triage Notes (Signed)
Started to have voice changes 3 days ago, scratchy throat for past 2 days.  C/o neck tenderness.

## 2021-01-08 ENCOUNTER — Other Ambulatory Visit (HOSPITAL_COMMUNITY)
Admission: RE | Admit: 2021-01-08 | Discharge: 2021-01-08 | Disposition: A | Discharge status: Home / Self Care | Payer: Self-pay | Source: Ambulatory Visit | Attending: Obstetrics and Gynecology | Admitting: Obstetrics and Gynecology

## 2021-01-08 ENCOUNTER — Other Ambulatory Visit: Payer: Self-pay

## 2021-01-08 ENCOUNTER — Ambulatory Visit: Payer: Self-pay | Admitting: *Deleted

## 2021-01-08 VITALS — BP 123/70 | Wt 139.3 lb

## 2021-01-08 DIAGNOSIS — N6452 Nipple discharge: Secondary | ICD-10-CM | POA: Insufficient documentation

## 2021-01-08 DIAGNOSIS — Z1239 Encounter for other screening for malignant neoplasm of breast: Secondary | ICD-10-CM

## 2021-01-08 NOTE — Progress Notes (Signed)
Ms. Rebecca [name] is a 30 y.o. female who presents to [facility] clinic today with complaint of bilateral breast discharge for 8 years that is clear to milky in color. Patient stated the discharge happens when she expresses the breast but is spontaneous at times.   Patient referred to BCCCP by the [facility] Department due to having an abnormal Pap smear 11/24/2020 that a colposcopy is recommended for follow-up.   Pap Smear: Pap smear not completed today. Last Pap smear was 11/24/2020 at [facility] clinic and was abnormal - HSIL with positive HPV . Per patient has history of an abnormal Pap smear on year ago that a repeat Pap smear was completed for follow-up. Last Pap smear result is not available in Epic.Last Pap smear result will be scanned into Epic.   Physical exam: Breasts Breasts symmetrical. No skin abnormalities bilateral breasts. No nipple retraction bilateral breasts. Expressed a milky discharge from the right breast on exam. Expressed a clear colored discharge from the left breast on exam. Sample of discharge from each breast sent to Cytology for evaluation. No lymphadenopathy. No lumps palpated bilateral breasts. No complaints of pain or tenderness on exam.       Pelvic/Bimanual Pap is not indicated today per BCCCP guidelines.   Smoking History: Patient is a current smoker. Discussed smoking cessation with patient. Referred to the [facility] Quitline and gave resources to the free smoking cessation classes at [facility].   Patient Navigation: Patient education provided. Access to services provided for patient through BCCCP program.    Breast and Cervical Cancer Risk Assessment: Patient does not have family history of breast cancer, known genetic mutations, or radiation treatment to the chest before age 54. Patient does not have history of cervical dysplasia, immunocompromised, or DES exposure in-utero. Breast cancer risk assessment completed. No  breast cancer risk calculated due to patient is less than 62 years old.  Risk Assessment     Risk Scores       01/08/2021   Last edited by: Meryl Dare, CMA   5-year risk:    Lifetime risk:             A: BCCCP exam without pap smear Complaint of bilateral breast discharge.  P: Referred patient to [facility] for a diagnostic mammogram. Appointment scheduled Tuesday, February 16, 2021 at 1500.  Referred patient to [facility] for [facility] at [facility] for a colposcopy to follow-up for her abnormal Pap smear. Appointment scheduled for Tuesday, January 12, 2021 at 1050.  Priscille Heidelberg, RN 01/08/2021 11:10 AM

## 2021-01-08 NOTE — Patient Instructions (Addendum)
Explained breast self awareness with Cruz Condon. Patient did not need a Pap smear today due to last Pap smear was 11/24/2020. Explained the colposcopy the recommended follow-up for her abnormal Pap smear. Referred patient to [facility] for [facility] at [facility] for a colposcopy to follow-up for her abnormal Pap smear. Appointment scheduled for Tuesday, January 12, 2021 at 1050. Referred patient to [facility]amm[facility] for a diagnostic mammogram. Appointment scheduled Tuesday, February 16, 2021 at 1500.  Patient aware of appointments and will be there. Let patient know will follow up with her within the next couple weeks with results of breast discharge by phone. Discussed smoking cessation with patient. Referred to the [facility] Quitline and gave resources to the free smoking cessation classes at [facility]. Cruz Condon verbalized understanding.  Jasiyah Poland, Kathaleen Maser, RN 11:10 AM

## 2021-01-11 LAB — CYTOLOGY - NON PAP

## 2021-01-12 ENCOUNTER — Other Ambulatory Visit: Payer: Self-pay

## 2021-01-12 ENCOUNTER — Encounter: Payer: Self-pay | Admitting: Obstetrics & Gynecology

## 2021-01-12 ENCOUNTER — Other Ambulatory Visit (HOSPITAL_COMMUNITY)
Admission: RE | Admit: 2021-01-12 | Discharge: 2021-01-12 | Disposition: A | Discharge status: Home / Self Care | Payer: Self-pay | Source: Ambulatory Visit | Attending: Obstetrics & Gynecology | Admitting: Obstetrics & Gynecology

## 2021-01-12 ENCOUNTER — Telehealth: Payer: Self-pay

## 2021-01-12 ENCOUNTER — Ambulatory Visit (INDEPENDENT_AMBULATORY_CARE_PROVIDER_SITE_OTHER): Payer: Self-pay | Admitting: Obstetrics & Gynecology

## 2021-01-12 VITALS — BP 135/86 | HR 77 | Wt 142.0 lb

## 2021-01-12 DIAGNOSIS — R87613 High grade squamous intraepithelial lesion on cytologic smear of cervix (HGSIL): Secondary | ICD-10-CM | POA: Insufficient documentation

## 2021-01-12 NOTE — Telephone Encounter (Addendum)
Patient informed pathology for discharge of both breasts revealed no malignant cells, will wait for mammogram results (scheduled for 01/26/2021 @ 3:00 pm). Patient verbalized understanding.

## 2021-01-12 NOTE — Addendum Note (Signed)
Addended by: Moss Mc on: 01/12/2021 12:28 PM   Modules accepted: Orders

## 2021-01-12 NOTE — Progress Notes (Signed)
    Colposcopy Procedure Note:    Colposcopy Procedure Note  Indications:  2022 HSIL cytology/+HR HPV undifferentiated   2019 ASCCP recommendation:  Smoker:  No. New sexual partner:  No.  : time frame:    History of abnormal Pap: yes  Procedure Details  The risks and benefits of the procedure and Written informed consent obtained.  Speculum placed in vagina and excellent visualization of cervix achieved, cervix swabbed x 3 with acetic acid solution.  Findings: Adequate colposcopy is noted today.  Cervix: dense AWE of the entire SCJ, worse at 3 o'clock no vascular changes no mosaicism or punctation or abnormal vessels; cervical biopsies taken at 3 o'clock, specimen labelled and sent to pathology, and hemostasis achieved with Monsel's solution. Vaginal inspection: normal without visible lesions. Vulvar colposcopy: vulvar colposcopy not performed.  Specimens: cervical biopsy  Complications: none.  Colposcopic Impression: Cervical dysplasia does not appear to be high grade as no vascular changes  Plan(Based on 2019 ASCCP recommendations) MyChart video results visit

## 2021-01-14 LAB — SURGICAL PATHOLOGY

## 2021-01-21 ENCOUNTER — Other Ambulatory Visit: Payer: Self-pay

## 2021-01-21 ENCOUNTER — Telehealth (INDEPENDENT_AMBULATORY_CARE_PROVIDER_SITE_OTHER): Payer: Self-pay | Admitting: Obstetrics & Gynecology

## 2021-01-21 ENCOUNTER — Encounter: Payer: Self-pay | Admitting: Obstetrics & Gynecology

## 2021-01-21 DIAGNOSIS — R87613 High grade squamous intraepithelial lesion on cytologic smear of cervix (HGSIL): Secondary | ICD-10-CM
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
## 2021-01-28 ENCOUNTER — Ambulatory Visit (HOSPITAL_COMMUNITY)
Admission: RE | Admit: 2021-01-28 | Discharge: 2021-01-28 | Disposition: A | Discharge status: Home / Self Care | Payer: Self-pay | Source: Ambulatory Visit | Attending: Obstetrics and Gynecology | Admitting: Obstetrics and Gynecology

## 2021-01-28 ENCOUNTER — Other Ambulatory Visit: Payer: Self-pay

## 2021-01-28 ENCOUNTER — Other Ambulatory Visit (HOSPITAL_COMMUNITY): Payer: Self-pay | Admitting: Obstetrics and Gynecology

## 2021-01-28 DIAGNOSIS — R928 Other abnormal and inconclusive findings on diagnostic imaging of breast: Secondary | ICD-10-CM

## 2021-01-28 DIAGNOSIS — N6452 Nipple discharge: Secondary | ICD-10-CM

## 2021-02-01 ENCOUNTER — Telehealth: Payer: Self-pay | Admitting: *Deleted

## 2021-02-01 NOTE — Telephone Encounter (Signed)
Received call from [facility] at the [facility] Department due to patient had questions about her follow-up recommended from her colposcopy results. Called patient back. Explained to patient that her Pap smear showed high grade abnormal cells and that the colposcopy was benign. Therefore the results do not correlate and the provider would like to do additional treatment to ensure nothing was missed. Explained to patient that received approval from the [facility] to cover the [facility] through BCCCP. Told patient she may be eligible for Medicaid through BCCCP based on the results of her procedure. Informed patient if she has any additional questions or receives any bills from her procedure to let me know. Patient verbalized understanding.   Patient will be eligible for BCCCP Medicaid if pathology shows CIN-II or greater.

## 2021-02-02 ENCOUNTER — Other Ambulatory Visit (HOSPITAL_COMMUNITY): Payer: Self-pay | Admitting: Obstetrics and Gynecology

## 2021-02-02 ENCOUNTER — Other Ambulatory Visit: Payer: Self-pay

## 2021-02-02 ENCOUNTER — Ambulatory Visit (HOSPITAL_COMMUNITY)
Admission: RE | Admit: 2021-02-02 | Discharge: 2021-02-02 | Disposition: A | Discharge status: Home / Self Care | Payer: Self-pay | Source: Ambulatory Visit | Attending: Obstetrics and Gynecology | Admitting: Obstetrics and Gynecology

## 2021-02-02 ENCOUNTER — Encounter (HOSPITAL_COMMUNITY): Payer: Self-pay

## 2021-02-02 DIAGNOSIS — R928 Other abnormal and inconclusive findings on diagnostic imaging of breast: Secondary | ICD-10-CM | POA: Insufficient documentation

## 2021-02-02 MED ORDER — LIDOCAINE HCL (PF) 1 % IJ SOLN
INTRAMUSCULAR | Status: AC
  Administered 2021-02-02: 5 mL
  Filled 2021-02-02: qty 5

## 2021-02-02 MED ORDER — LIDOCAINE-EPINEPHRINE (PF) 2 %-1:200000 IJ SOLN
INTRAMUSCULAR | Status: AC
  Administered 2021-02-02: 10 mL
  Filled 2021-02-02: qty 10

## 2021-02-02 NOTE — Progress Notes (Signed)
PT tolerated left breast biopsy well today with NAD noted. PT verbalized understanding of discharge instructions. PT ambulated back to the mammogram area this time.  

## 2021-02-03 ENCOUNTER — Ambulatory Visit (HOSPITAL_COMMUNITY): Payer: Self-pay

## 2021-02-03 LAB — SURGICAL PATHOLOGY

## 2021-02-12 ENCOUNTER — Other Ambulatory Visit: Payer: Self-pay | Admitting: Obstetrics & Gynecology

## 2021-02-12 NOTE — Patient Instructions (Signed)
Rebecca [name]  02/12/2021     @PREFPERIOPPHARMACY @   Your procedure is scheduled on 02/17/2021.   Report to [facility] at  0900 A.M.   Call this number if you have problems the morning of surgery:  [phone]   Remember:  Do not eat  after midnight.   You may drink clear liquids until  0640 AM .  Clear liquids allowed are:                    Water, Juice (non-citric and without pulp - diabetics please choose diet or no sugar options), Carbonated beverages - (diabetics please choose diet or no sugar options), Clear Tea, Black Coffee only (no creamer, milk or cream including half and half), Plain Jell-O only (diabetics please choose diet or no sugar options), Gatorade (diabetics please choose diet or no sugar options), and Plain Popsicles only     AT 0640, drink your carb drink and then nothing else to drink after this.    Take these medicines the morning of surgery with A SIP OF WATER  None    Do not wear jewelry, make-up or nail polish.  Do not wear lotions, powders, or perfumes, or deodorant.  Do not shave 48 hours prior to surgery.  Men may shave face and neck.  Do not bring valuables to the hospital.  [facility] is not responsible for any belongings or valuables.  Contacts, dentures or bridgework may not be worn into surgery.  Leave your suitcase in the car.  After surgery it may be brought to your room.  For patients admitted to the hospital, discharge time will be determined by your treatment team.  Patients discharged the day of surgery will not be allowed to drive home and must have someone with them for 24 hours.    Special instructions:   DO NOT smoke tobacco or vape for 24 hours before your procedure.  Please read over the following fact sheets that you were given. Coughing and Deep Breathing, Surgical Site Infection Prevention, Anesthesia Post-op Instructions, and Care and Recovery After Surgery      Cervical Laser Surgery, Care  After This sheet gives you information about how to care for yourself after your procedure. Your health care provider may also give you more specific instructions. If you have problems or questions, contact your health care provider. What can I expect after the procedure? After the procedure, it is common to have: Pain or discomfort. Mild cramping. Bleeding, spotting, or brownish discharge from your vagina. Follow these instructions at home: Activity  Rest as told by your health care provider. Do not lift anything that is heavier than 10 lb (4.5 kg), or the limit that you are told, until your health care provider says that it is safe. Do not have sex until your health care provider says it is okay. Return to your normal activities as told by your health care provider. Ask your health care provider what activities are safe for you. General instructions Take over-the-counter and prescription medicines only as told by your health care provider. Ask your health care provider if the medicine prescribed to you requires you to avoid driving or using heavy machinery. Wear sanitary pads to absorb any bleeding, spotting, and discharge. Do not douche or put anything into your vagina, including tampons, until your health care provider says it is okay. It is up to you to get the results of your procedure. Ask  your health care provider, or the department that is doing the procedure, when your results will be ready. Keep all follow-up visits as told by your health care provider. This is important. Contact a health care provider if: Your pain or cramping does not improve. Your periods are more painful than usual. You do not get your period as expected. Get help right away if you have: Any symptoms of infection, such as: A fever. Chills. Discharge that smells bad. Severe pain in your abdomen. Heavy bleeding from your vagina (more than a normal period). Vaginal bleeding with clumps of blood (blood  clots). Summary After this procedure, it is common to have pain or discomfort and mild cramping. It is also common to have bleeding, spotting, or brownish discharge from your vagina. Do not have sex, douche, use tampons, or put anything in your vagina until your health care provider says it is okay. Return to your normal activities as told by your health care provider. Ask your health care provider what activities are safe for you. Take over-the-counter and prescription medicines only as told by your health care provider. You may need to wear sanitary pads to absorb any bleeding, spotting, and discharge. This information is not intended to replace advice given to you by your health care provider. Make sure you discuss any questions you have with your health care provider. Document Revised: 10/15/2018 Document Reviewed: 10/15/2018 Elsevier Patient Education  2022 [employer] General Anesthesia, Adult, Care After This sheet gives you information about how to care for yourself after your procedure. Your health care provider may also give you more specific instructions. If you have problems or questions, contact your health care provider. What can I expect after the procedure? After the procedure, the following side effects are common: Pain or discomfort at the IV site. Nausea. Vomiting. Sore throat. Trouble concentrating. Feeling cold or chills. Feeling weak or tired. Sleepiness and fatigue. Soreness and body aches. These side effects can affect parts of the body that were not involved in surgery. Follow these instructions at home: For the time period you were told by your health care provider:  Rest. Do not participate in activities where you could fall or become injured. Do not drive or use machinery. Do not drink alcohol. Do not take sleeping pills or medicines that cause drowsiness. Do not make important decisions or sign legal documents. Do not take care of children on your  own. Eating and drinking Follow any instructions from your health care provider about eating or drinking restrictions. When you feel hungry, start by eating small amounts of foods that are soft and easy to digest (bland), such as toast. Gradually return to your regular diet. Drink enough fluid to keep your urine pale yellow. If you vomit, rehydrate by drinking water, juice, or clear broth. General instructions If you have sleep apnea, surgery and certain medicines can increase your risk for breathing problems. Follow instructions from your health care provider about wearing your sleep device: Anytime you are sleeping, including during daytime naps. While taking prescription pain medicines, sleeping medicines, or medicines that make you drowsy. Have a responsible adult stay with you for the time you are told. It is important to have someone help care for you until you are awake and alert. Return to your normal activities as told by your health care provider. Ask your health care provider what activities are safe for you. Take over-the-counter and prescription medicines only as told by your health care provider. If you smoke,  do not smoke without supervision. Keep all follow-up visits as told by your health care provider. This is important. Contact a health care provider if: You have nausea or vomiting that does not get better with medicine. You cannot eat or drink without vomiting. You have pain that does not get better with medicine. You are unable to pass urine. You develop a skin rash. You have a fever. You have redness around your IV site that gets worse. Get help right away if: You have difficulty breathing. You have chest pain. You have blood in your urine or stool, or you vomit blood. Summary After the procedure, it is common to have a sore throat or nausea. It is also common to feel tired. Have a responsible adult stay with you for the time you are told. It is important to have  someone help care for you until you are awake and alert. When you feel hungry, start by eating small amounts of foods that are soft and easy to digest (bland), such as toast. Gradually return to your regular diet. Drink enough fluid to keep your urine pale yellow. Return to your normal activities as told by your health care provider. Ask your health care provider what activities are safe for you. This information is not intended to replace advice given to you by your health care provider. Make sure you discuss any questions you have with your health care provider. Document Revised: 01/16/2020 Document Reviewed: 08/15/2019 Elsevier Patient Education  2022 [employer] Inc. How to Use Chlorhexidine for Bathing Chlorhexidine gluconate (CHG) is a germ-killing (antiseptic) solution that is used to clean the skin. It can get rid of the bacteria that normally live on the skin and can keep them away for about 24 hours. To clean your skin with CHG, you may be given: A CHG solution to use in the shower or as part of a sponge bath. A prepackaged cloth that contains CHG. Cleaning your skin with CHG may help lower the risk for infection: While you are staying in the intensive care unit of the hospital. If you have a vascular access, such as a central line, to provide short-term or long-term access to your veins. If you have a catheter to drain urine from your bladder. If you are on a ventilator. A ventilator is a machine that helps you breathe by moving air in and out of your lungs. After surgery. What are the risks? Risks of using CHG include: A skin reaction. Hearing loss, if CHG gets in your ears and you have a perforated eardrum. Eye injury, if CHG gets in your eyes and is not rinsed out. The CHG product catching fire. Make sure that you avoid smoking and flames after applying CHG to your skin. Do not use CHG: If you have a chlorhexidine allergy or have previously reacted to chlorhexidine. On babies  younger than 32 months of age. How to use CHG solution Use CHG only as told by your health care provider, and follow the instructions on the label. Use the full amount of CHG as directed. Usually, this is one bottle. During a shower Follow these steps when using CHG solution during a shower (unless your health care provider gives you different instructions): Start the shower. Use your normal soap and shampoo to wash your face and hair. Turn off the shower or move out of the shower stream. Pour the CHG onto a clean washcloth. Do not use any type of brush or rough-edged sponge. Starting at your neck, lather  your body down to your toes. Make sure you follow these instructions: If you will be having surgery, pay special attention to the part of your body where you will be having surgery. Scrub this area for at least 1 minute. Do not use CHG on your head or face. If the solution gets into your ears or eyes, rinse them well with water. Avoid your genital area. Avoid any areas of skin that have broken skin, cuts, or scrapes. Scrub your back and under your arms. Make sure to wash skin folds. Let the lather sit on your skin for 1-2 minutes or as long as told by your health care provider. Thoroughly rinse your entire body in the shower. Make sure that all body creases and crevices are rinsed well. Dry off with a clean towel. Do not put any substances on your body afterward--such as powder, lotion, or perfume--unless you are told to do so by your health care provider. Only use lotions that are recommended by the manufacturer. Put on clean clothes or pajamas. If it is the night before your surgery, sleep in clean sheets.  During a sponge bath Follow these steps when using CHG solution during a sponge bath (unless your health care provider gives you different instructions): Use your normal soap and shampoo to wash your face and hair. Pour the CHG onto a clean washcloth. Starting at your neck, lather your  body down to your toes. Make sure you follow these instructions: If you will be having surgery, pay special attention to the part of your body where you will be having surgery. Scrub this area for at least 1 minute. Do not use CHG on your head or face. If the solution gets into your ears or eyes, rinse them well with water. Avoid your genital area. Avoid any areas of skin that have broken skin, cuts, or scrapes. Scrub your back and under your arms. Make sure to wash skin folds. Let the lather sit on your skin for 1-2 minutes or as long as told by your health care provider. Using a different clean, wet washcloth, thoroughly rinse your entire body. Make sure that all body creases and crevices are rinsed well. Dry off with a clean towel. Do not put any substances on your body afterward--such as powder, lotion, or perfume--unless you are told to do so by your health care provider. Only use lotions that are recommended by the manufacturer. Put on clean clothes or pajamas. If it is the night before your surgery, sleep in clean sheets. How to use CHG prepackaged cloths Only use CHG cloths as told by your health care provider, and follow the instructions on the label. Use the CHG cloth on clean, dry skin. Do not use the CHG cloth on your head or face unless your health care provider tells you to. When washing with the CHG cloth: Avoid your genital area. Avoid any areas of skin that have broken skin, cuts, or scrapes. Before surgery Follow these steps when using a CHG cloth to clean before surgery (unless your health care provider gives you different instructions): Using the CHG cloth, vigorously scrub the part of your body where you will be having surgery. Scrub using a back-and-forth motion for 3 minutes. The area on your body should be completely wet with CHG when you are done scrubbing. Do not rinse. Discard the cloth and let the area air-dry. Do not put any substances on the area afterward, such as  powder, lotion, or perfume. Put on clean  clothes or pajamas. If it is the night before your surgery, sleep in clean sheets.  For general bathing Follow these steps when using CHG cloths for general bathing (unless your health care provider gives you different instructions). Use a separate CHG cloth for each area of your body. Make sure you wash between any folds of skin and between your fingers and toes. Wash your body in the following order, switching to a new cloth after each step: The front of your neck, shoulders, and chest. Both of your arms, under your arms, and your hands. Your stomach and groin area, avoiding the genitals. Your right leg and foot. Your left leg and foot. The back of your neck, your back, and your buttocks. Do not rinse. Discard the cloth and let the area air-dry. Do not put any substances on your body afterward--such as powder, lotion, or perfume--unless you are told to do so by your health care provider. Only use lotions that are recommended by the manufacturer. Put on clean clothes or pajamas. Contact a health care provider if: Your skin gets irritated after scrubbing. You have questions about using your solution or cloth. You swallow any chlorhexidine. Call your local poison control center (4124978972 in the [place] Get help right away if: Your eyes itch badly, or they become very red or swollen. Your skin itches badly and is red or swollen. Your hearing changes. You have trouble seeing. You have swelling or tingling in your mouth or throat. You have trouble breathing. These symptoms may represent a serious problem that is an emergency. Do not wait to see if the symptoms will go away. Get medical help right away. Call your local emergency services (911 in the [place] Do not drive yourself to the hospital. Summary Chlorhexidine gluconate (CHG) is a germ-killing (antiseptic) solution that is used to clean the skin. Cleaning your skin with CHG may help to lower  your risk for infection. You may be given CHG to use for bathing. It may be in a bottle or in a prepackaged cloth to use on your skin. Carefully follow your health care provider's instructions and the instructions on the product label. Do not use CHG if you have a chlorhexidine allergy. Contact your health care provider if your skin gets irritated after scrubbing. This information is not intended to replace advice given to you by your health care provider. Make sure you discuss any questions you have with your health care provider. Document Revised: 07/13/2020 Document Reviewed: 07/13/2020 Elsevier Patient Education  2022 [employer].

## 2021-02-15 ENCOUNTER — Encounter (HOSPITAL_COMMUNITY)
Admission: RE | Admit: 2021-02-15 | Discharge: 2021-02-15 | Disposition: A | Discharge status: Home / Self Care | Payer: Medicaid Other | Source: Ambulatory Visit | Attending: Obstetrics & Gynecology | Admitting: Obstetrics & Gynecology

## 2021-02-15 ENCOUNTER — Other Ambulatory Visit: Payer: Self-pay

## 2021-02-15 ENCOUNTER — Encounter (HOSPITAL_COMMUNITY): Payer: Self-pay

## 2021-02-15 DIAGNOSIS — Z01818 Encounter for other preprocedural examination: Secondary | ICD-10-CM | POA: Diagnosis present

## 2021-02-15 LAB — CBC
HCT: 40 % (ref 36.0–46.0)
Hemoglobin: 13.3 g/dL (ref 12.0–15.0)
MCH: 29.8 pg (ref 26.0–34.0)
MCHC: 33.3 g/dL (ref 30.0–36.0)
MCV: 89.5 fL (ref 80.0–100.0)
Platelets: 321 10*3/uL (ref 150–400)
RBC: 4.47 MIL/uL (ref 3.87–5.11)
RDW: 12.4 % (ref 11.5–15.5)
WBC: 5.8 10*3/uL (ref 4.0–10.5)
nRBC: 0 % (ref 0.0–0.2)

## 2021-02-15 LAB — COMPREHENSIVE METABOLIC PANEL
ALT: 13 U/L (ref 0–44)
AST: 15 U/L (ref 15–41)
Albumin: 3.8 g/dL (ref 3.5–5.0)
Alkaline Phosphatase: 74 U/L (ref 38–126)
Anion gap: 7 (ref 5–15)
BUN: 8 mg/dL (ref 6–20)
CO2: 22 mmol/L (ref 22–32)
Calcium: 8.8 mg/dL — ABNORMAL LOW (ref 8.9–10.3)
Chloride: 109 mmol/L (ref 98–111)
Creatinine, Ser: 0.56 mg/dL (ref 0.44–1.00)
GFR, Estimated: >60 mL/min (ref >60)
Glucose, Bld: 92 mg/dL (ref 70–99)
Potassium: 3.7 mmol/L (ref 3.5–5.1)
Sodium: 138 mmol/L (ref 135–145)
Total Bilirubin: 0.7 mg/dL (ref 0.3–1.2)
Total Protein: 6.8 g/dL (ref 6.5–8.1)

## 2021-02-15 LAB — HCG, QUANTITATIVE, PREGNANCY: hCG, Beta Chain, Quant, S: <1 m[IU]/mL (ref <5)

## 2021-02-15 LAB — URINALYSIS, ROUTINE W REFLEX MICROSCOPIC
Bilirubin Urine: NEGATIVE
Glucose, UA: NEGATIVE mg/dL
Hgb urine dipstick: NEGATIVE
Ketones, ur: NEGATIVE mg/dL
Leukocytes,Ua: NEGATIVE
Nitrite: NEGATIVE
Protein, ur: NEGATIVE mg/dL
Specific Gravity, Urine: 1.01 (ref 1.005–1.030)
pH: 7 (ref 5.0–8.0)

## 2021-02-15 LAB — RAPID HIV SCREEN (HIV 1/2 AB+AG)
HIV 1/2 Antibodies: NONREACTIVE
HIV-1 P24 Antigen - HIV24: NONREACTIVE

## 2021-02-16 ENCOUNTER — Other Ambulatory Visit (HOSPITAL_COMMUNITY): Payer: Self-pay

## 2021-02-16 ENCOUNTER — Encounter (HOSPITAL_COMMUNITY): Payer: Self-pay

## 2021-02-17 ENCOUNTER — Ambulatory Visit (HOSPITAL_COMMUNITY): Payer: Medicaid Other | Admitting: Certified Registered Nurse Anesthetist

## 2021-02-17 ENCOUNTER — Other Ambulatory Visit: Payer: Self-pay

## 2021-02-17 ENCOUNTER — Encounter (HOSPITAL_COMMUNITY): Payer: Self-pay | Admitting: Obstetrics & Gynecology

## 2021-02-17 ENCOUNTER — Ambulatory Visit (HOSPITAL_COMMUNITY)
Admission: RE | Admit: 2021-02-17 | Discharge: 2021-02-17 | Disposition: A | Discharge status: Home / Self Care | Payer: Medicaid Other | Source: Ambulatory Visit | Attending: Obstetrics & Gynecology | Admitting: Obstetrics & Gynecology

## 2021-02-17 ENCOUNTER — Encounter (HOSPITAL_COMMUNITY)
Admission: RE | Disposition: A | Discharge status: Home / Self Care | Payer: Self-pay | Source: Ambulatory Visit | Attending: Obstetrics & Gynecology

## 2021-02-17 DIAGNOSIS — N871 Moderate cervical dysplasia: Secondary | ICD-10-CM | POA: Diagnosis not present

## 2021-02-17 DIAGNOSIS — F17200 Nicotine dependence, unspecified, uncomplicated: Secondary | ICD-10-CM | POA: Diagnosis not present

## 2021-02-17 DIAGNOSIS — R87613 High grade squamous intraepithelial lesion on cytologic smear of cervix (HGSIL): Secondary | ICD-10-CM

## 2021-02-17 HISTORY — PX: CERVICAL CONIZATION W/BX: SHX1330

## 2021-02-17 SURGERY — CONE BIOPSY, CERVIX
Anesthesia: General | Site: Cervix

## 2021-02-17 MED ORDER — MIDAZOLAM HCL 2 MG/2ML IJ SOLN
INTRAMUSCULAR | Status: AC
  Filled 2021-02-17: qty 2

## 2021-02-17 MED ORDER — CEFAZOLIN SODIUM-DEXTROSE 2-4 GM/100ML-% IV SOLN
2.0000 g | INTRAVENOUS | Status: AC
  Administered 2021-02-17: 2 g via INTRAVENOUS

## 2021-02-17 MED ORDER — FERRIC SUBSULFATE 259 MG/GM EX SOLN
CUTANEOUS | Status: AC
  Filled 2021-02-17: qty 8

## 2021-02-17 MED ORDER — DEXAMETHASONE SODIUM PHOSPHATE 10 MG/ML IJ SOLN
INTRAMUSCULAR | Status: AC
  Filled 2021-02-17: qty 1

## 2021-02-17 MED ORDER — ACETIC ACID 5 % SOLN
Status: DC | PRN
  Administered 2021-02-17: 1 via TOPICAL

## 2021-02-17 MED ORDER — KETOROLAC TROMETHAMINE 30 MG/ML IJ SOLN
INTRAMUSCULAR | Status: AC
  Administered 2021-02-17: 30 mg via INTRAVENOUS
  Filled 2021-02-17: qty 1

## 2021-02-17 MED ORDER — CHLORHEXIDINE GLUCONATE 0.12 % MT SOLN
OROMUCOSAL | Status: AC
  Administered 2021-02-17: 15 mL via OROMUCOSAL
  Filled 2021-02-17: qty 15

## 2021-02-17 MED ORDER — ONDANSETRON HCL 4 MG/2ML IJ SOLN
INTRAMUSCULAR | Status: AC
  Filled 2021-02-17: qty 2

## 2021-02-17 MED ORDER — CEFAZOLIN SODIUM-DEXTROSE 2-4 GM/100ML-% IV SOLN
INTRAVENOUS | Status: AC
  Filled 2021-02-17: qty 100

## 2021-02-17 MED ORDER — FENTANYL CITRATE (PF) 100 MCG/2ML IJ SOLN
INTRAMUSCULAR | Status: AC
  Filled 2021-02-17: qty 2

## 2021-02-17 MED ORDER — ONDANSETRON HCL 4 MG/2ML IJ SOLN
INTRAMUSCULAR | Status: AC
  Filled 2021-02-17: qty 4

## 2021-02-17 MED ORDER — LIDOCAINE HCL (CARDIAC) PF 100 MG/5ML IV SOSY
PREFILLED_SYRINGE | INTRAVENOUS | Status: DC | PRN
  Administered 2021-02-17: 60 mg via INTRAVENOUS

## 2021-02-17 MED ORDER — PROPOFOL 10 MG/ML IV BOLUS
INTRAVENOUS | Status: DC | PRN
  Administered 2021-02-17: 200 mg via INTRAVENOUS

## 2021-02-17 MED ORDER — LACTATED RINGERS IV SOLN
INTRAVENOUS | Status: DC
  Administered 2021-02-17: 1000 mL via INTRAVENOUS

## 2021-02-17 MED ORDER — POVIDONE-IODINE 10 % EX SWAB: 2.0000 "application " | Freq: Once | CUTANEOUS | Status: DC

## 2021-02-17 MED ORDER — KETOROLAC TROMETHAMINE 10 MG PO TABS: 10.0000 mg | ORAL_TABLET | Freq: Three times a day (TID) | ORAL | 0 refills | Status: DC | PRN

## 2021-02-17 MED ORDER — FENTANYL CITRATE (PF) 250 MCG/5ML IJ SOLN
INTRAMUSCULAR | Status: DC | PRN
  Administered 2021-02-17: 25 ug via INTRAVENOUS
  Administered 2021-02-17: 50 ug via INTRAVENOUS
  Administered 2021-02-17 (×2): 25 ug via INTRAVENOUS

## 2021-02-17 MED ORDER — PHENYLEPHRINE 40 MCG/ML (10ML) SYRINGE FOR IV PUSH (FOR BLOOD PRESSURE SUPPORT)
PREFILLED_SYRINGE | INTRAVENOUS | Status: AC
  Filled 2021-02-17: qty 20

## 2021-02-17 MED ORDER — MIDAZOLAM HCL 2 MG/2ML IJ SOLN
INTRAMUSCULAR | Status: DC | PRN
  Administered 2021-02-17: 2 mg via INTRAVENOUS

## 2021-02-17 MED ORDER — KETOROLAC TROMETHAMINE 30 MG/ML IJ SOLN: 30.0000 mg | Freq: Once | INTRAMUSCULAR | Status: AC

## 2021-02-17 MED ORDER — LIDOCAINE HCL (PF) 2 % IJ SOLN
INTRAMUSCULAR | Status: AC
  Filled 2021-02-17: qty 10

## 2021-02-17 MED ORDER — ONDANSETRON 8 MG PO TBDP: 8.0000 mg | ORAL_TABLET | Freq: Three times a day (TID) | ORAL | 0 refills | Status: DC | PRN

## 2021-02-17 MED ORDER — WATER FOR IRRIGATION, STERILE IR SOLN
Status: DC | PRN
  Administered 2021-02-17: 1000 mL

## 2021-02-17 MED ORDER — CHLORHEXIDINE GLUCONATE 0.12 % MT SOLN: 15.0000 mL | Freq: Once | OROMUCOSAL | Status: AC

## 2021-02-17 MED ORDER — ORAL CARE MOUTH RINSE: 15.0000 mL | Freq: Once | OROMUCOSAL | Status: AC

## 2021-02-17 MED ORDER — FERRIC SUBSULFATE SOLN
Status: DC | PRN
  Administered 2021-02-17: 1

## 2021-02-17 MED ORDER — ONDANSETRON HCL 4 MG/2ML IJ SOLN
INTRAMUSCULAR | Status: DC | PRN
  Administered 2021-02-17: 4 mg via INTRAVENOUS

## 2021-02-17 MED ORDER — LIDOCAINE HCL (PF) 2 % IJ SOLN
INTRAMUSCULAR | Status: AC
  Filled 2021-02-17: qty 5

## 2021-02-17 SURGICAL SUPPLY — 25 items
APL SWBSTK 6 STRL LF DISP (MISCELLANEOUS) ×1
APPLICATOR COTTON TIP 6 STRL (MISCELLANEOUS) ×1 IMPLANT
APPLICATOR COTTON TIP 6IN STRL (MISCELLANEOUS) ×2
BAG HAMPER (MISCELLANEOUS) ×2 IMPLANT
CLOTH BEACON ORANGE TIMEOUT ST (SAFETY) ×2 IMPLANT
COVER LIGHT HANDLE STERIS (MISCELLANEOUS) ×2 IMPLANT
ELECT REM PT RETURN 9FT ADLT (ELECTROSURGICAL) ×2
ELECTRODE REM PT RTRN 9FT ADLT (ELECTROSURGICAL) ×1 IMPLANT
GLOVE ECLIPSE 8.0 STRL XLNG CF (GLOVE) ×2 IMPLANT
GLOVE SRG 8 PF TXTR STRL LF DI (GLOVE) ×1 IMPLANT
GLOVE SURG UNDER POLY LF SZ7 (GLOVE) ×4 IMPLANT
GLOVE SURG UNDER POLY LF SZ8 (GLOVE) ×2
GOWN STRL REUS W/TWL LRG LVL3 (GOWN DISPOSABLE) ×2 IMPLANT
GOWN STRL REUS W/TWL XL LVL3 (GOWN DISPOSABLE) ×2 IMPLANT
KIT TURNOVER KIT A (KITS) ×2 IMPLANT
LASER FIBER DISP 1000U (UROLOGICAL SUPPLIES) ×2 IMPLANT
PACK PERI GYN (CUSTOM PROCEDURE TRAY) ×2 IMPLANT
PAD ARMBOARD 7.5X6 YLW CONV (MISCELLANEOUS) ×2 IMPLANT
PREFILTER SMOKE EVAC (FILTER) ×2 IMPLANT
SCOPETTES 8  STERILE (MISCELLANEOUS) ×2
SCOPETTES 8 STERILE (MISCELLANEOUS) ×1 IMPLANT
SET BASIN LINEN APH (SET/KITS/TRAYS/PACK) ×2 IMPLANT
TOWEL OR 17X26 4PK STRL BLUE (TOWEL DISPOSABLE) ×2 IMPLANT
TUBING SMOKE EVAC CO2 (TUBING) ×2 IMPLANT
WATER STERILE IRR 1000ML POUR (IV SOLUTION) ×2 IMPLANT

## 2021-02-17 NOTE — Anesthesia Preprocedure Evaluation (Signed)
Anesthesia Evaluation  Patient identified by MRN, date of birth, ID band Patient awake    Reviewed: Allergy & Precautions, H&P , NPO status , Patient's Chart, lab work & pertinent test results, reviewed documented beta blocker date and time   Airway Mallampati: II  TM Distance: >3 FB Neck ROM: full    Dental no notable dental hx.    Pulmonary neg pulmonary ROS, Current Smoker and Patient abstained from smoking.,    Pulmonary exam normal breath sounds clear to auscultation       Cardiovascular Exercise Tolerance: Good negative cardio ROS   Rhythm:regular Rate:Normal     Neuro/Psych  Headaches, negative psych ROS   GI/Hepatic negative GI ROS, Neg liver ROS,   Endo/Other  negative endocrine ROS  Renal/GU negative Renal ROS  negative genitourinary   Musculoskeletal   Abdominal   Peds  Hematology negative hematology ROS (+)   Anesthesia Other Findings   Reproductive/Obstetrics negative OB ROS                             Anesthesia Physical Anesthesia Plan  ASA: 2  Anesthesia Plan: General and General LMA   Post-op Pain Management:    Induction:   PONV Risk Score and Plan: Ondansetron  Airway Management Planned:   Additional Equipment:   Intra-op Plan:   Post-operative Plan:   Informed Consent: I have reviewed the patients History and Physical, chart, labs and discussed the procedure including the risks, benefits and alternatives for the proposed anesthesia with the patient or authorized representative who has indicated his/her understanding and acceptance.     Dental Advisory Given  Plan Discussed with: CRNA  Anesthesia Plan Comments:         Anesthesia Quick Evaluation

## 2021-02-17 NOTE — Op Note (Signed)
Preoperative diagnosis:  1. 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  Cervical biopsy normal  Postoperative Diagnosis:  Same as above  Procedure:  Cervical conization using laser,  ablation of cervical bed using laser  Surgeon:  Rockne Coons MD  Anesthesia:  Laryngeal mask airway  Findings:   Today at the time of surgery a repeat colposcopy was performed using 3% acetic acid and the lesion was once again confirmed.  There  were no new findings today.  Description of operation:  Patient was taken to the operating room and placed in the supine position where she underwent laryngeal mask airway anesthesia.  She was then placed in the high lithotomy position using candy cane stirrups.  She was then draped out for a laser procedure.  The microscope was used and 3% acetic acid was placed on the cervix.  The holmium laser was then employed at a power of 2.5 and rates between 8 and 15.  I achieved a couple millimeter margin around lesions both at 12:00 and 6:00 the laser was used to perform a conization.  The specimen was removed and sent to pathology for evaluation.  As is always the case with laser I did achieve at an appropriate margin around the disease with shrinkage of the tissue during the procedure it may appear to be a positive lateral margin.  However the surgical margin is indeed clear.  I then used the laser to ablate the conization bed to a depth of 5-7 mm laterally coning down to 9 mm centrally and began getting good surgical margin.  Additional hemostasis was achieved using Monsel solution.  In the conization bed was completely hemostatic.  Blood loss for the procedure was none.  The patient received ancef and toradol preoperatively.  The patient was awakened from anesthesia and taken to the recovery room in good stable condition with all counts being correct.  She will be followed up in the office in one month for  evaluation of the conization bed.  [name], MD 02/17/2021 11:39 AM

## 2021-02-17 NOTE — Anesthesia Procedure Notes (Signed)
Procedure Name: LMA Insertion Date/Time: 02/17/2021 10:44 AM Performed by: Lorin Glass, CRNA Pre-anesthesia Checklist: Patient identified, Emergency Drugs available, Suction available and Patient being monitored Patient Re-evaluated:Patient Re-evaluated prior to induction Oxygen Delivery Method: Circle system utilized Preoxygenation: Pre-oxygenation with 100% oxygen Induction Type: IV induction LMA: LMA inserted LMA Size: 4.0 Number of attempts: 1 Placement Confirmation: positive ETCO2 and breath sounds checked- equal and bilateral Tube secured with: Tape Dental Injury: Teeth and Oropharynx as per pre-operative assessment

## 2021-02-17 NOTE — Anesthesia Postprocedure Evaluation (Signed)
Anesthesia Post Note  Patient: Rebecca [name]  Procedure(s) Performed: Laser CONIZATION of the CERVIX (Cervix)  Patient location during evaluation: Phase II Anesthesia Type: General Level of consciousness: awake Pain management: pain level controlled Vital Signs Assessment: post-procedure vital signs reviewed and stable Respiratory status: spontaneous breathing and respiratory function stable Cardiovascular status: blood pressure returned to baseline and stable Postop Assessment: no headache and no apparent nausea or vomiting Anesthetic complications: no Comments: Late entry   No notable events documented.   Last Vitals:  Vitals:   02/17/21 1145 02/17/21 1212  BP: 123/82 127/85  Pulse: 88 68  Resp: 14 16  Temp:  36.6 C  SpO2: 100% 100%    Last Pain:  Vitals:   02/17/21 1212  TempSrc: Oral  PainSc: 0-No pain                 Windell Norfolk

## 2021-02-17 NOTE — Transfer of Care (Signed)
Immediate Anesthesia Transfer of Care Note  Patient: Rebecca [name]  Procedure(s) Performed: Laser CONIZATION of the CERVIX (Cervix)  Patient Location: PACU  Anesthesia Type:General  Level of Consciousness: drowsy  Airway & Oxygen Therapy: Patient Spontanous Breathing and Patient connected to nasal cannula oxygen  Post-op Assessment: Report given to RN and Post -op Vital signs reviewed and stable  Post vital signs: Reviewed and stable  Last Vitals:  Vitals Value Taken Time  BP 95/44   Temp    Pulse 66   Resp 10   SpO2 98%     Last Pain: There were no vitals filed for this visit.    Patients Stated Pain Goal: 8 (02/17/21 0939)  Complications: No notable events documented.
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
## 2021-02-18 ENCOUNTER — Encounter (HOSPITAL_COMMUNITY): Payer: Self-pay | Admitting: Obstetrics & Gynecology

## 2021-02-18 LAB — SURGICAL PATHOLOGY

## 2021-02-25 ENCOUNTER — Other Ambulatory Visit: Payer: Self-pay

## 2021-02-25 ENCOUNTER — Encounter: Payer: Self-pay | Admitting: Obstetrics & Gynecology

## 2021-02-25 ENCOUNTER — Ambulatory Visit (INDEPENDENT_AMBULATORY_CARE_PROVIDER_SITE_OTHER): Payer: Self-pay | Admitting: Obstetrics & Gynecology

## 2021-02-25 VITALS — BP 136/85 | HR 100 | Ht 62.0 in | Wt 144.0 lb

## 2021-02-25 DIAGNOSIS — Z9889 Other specified postprocedural states: Secondary | ICD-10-CM

## 2021-02-25 NOTE — Progress Notes (Signed)
  HPI: Patient returns for routine postoperative follow-up having undergone laser conization of the cervix on 02/17/21.  The patient's immediate postoperative recovery has been unremarkable. Since hospital discharge the patient reports cramping/discharge/bleeding.   Current Outpatient Medications: ketorolac (TORADOL) 10 MG tablet, Take 1 tablet (10 mg total) by mouth every 8 (eight) hours as needed. (Patient not taking: Reported on 02/25/2021), Disp: 15 tablet, Rfl: 0 medroxyPROGESTERone (DEPO-PROVERA) 150 MG/ML injection, Inject 1 mL (150 mg total) into the muscle every 3 (three) months. (Patient not taking: Reported on 02/25/2021), Disp: 1 mL, Rfl: 3 ondansetron (ZOFRAN ODT) 8 MG disintegrating tablet, Take 1 tablet (8 mg total) by mouth every 8 (eight) hours as needed for nausea or vomiting. (Patient not taking: Reported on 02/25/2021), Disp: 8 tablet, Rfl: 0 prenatal vitamin w/FE, FA (PRENATAL 1 + 1) 27-1 MG TABS tablet, Take 1 tablet by mouth daily at 12 noon. (Patient not taking: No sig reported), Disp: 30 each, Rfl: 12  No current facility-administered medications for this visit.    Blood pressure 136/85, pulse 100, height 5\' 2"  (1.575 m), weight 144 lb (65.3 kg).  Physical Exam: Normal post laser cervical bed, additional Monsel's placed, no active bleeding, normal   Diagnostic Tests:   Pathology: HSIL uninvolved margins  Impression: 1. S/P laser conization of the cervix for HSIL: pathology confirmed with uninvolved margins      Plan: No orders of the defined types were placed in this encounter.    Follow up:  Return in about 6 months (around 08/26/2021) for First follow up Pap(no HPV, cytology only).   08/28/2021, MD

## 2021-05-19 ENCOUNTER — Telehealth: Payer: Self-pay

## 2021-05-19 NOTE — Telephone Encounter (Signed)
Vaughan Basta [facility] Medicaid) called and stated she needed to clarify dates/results of pap smear, colposcopy, and CKC in order to approve [employer] Medicaid application. Information clarified.

## 2022-11-28 ENCOUNTER — Other Ambulatory Visit: Payer: Self-pay

## 2022-11-28 ENCOUNTER — Encounter (HOSPITAL_COMMUNITY): Payer: Self-pay | Admitting: Emergency Medicine

## 2022-11-28 ENCOUNTER — Emergency Department (HOSPITAL_COMMUNITY)
Admission: EM | Admit: 2022-11-28 | Discharge: 2022-11-28 | Disposition: A | Discharge status: Home / Self Care | Payer: Medicaid Other | Attending: Emergency Medicine | Admitting: Emergency Medicine

## 2022-11-28 DIAGNOSIS — M546 Pain in thoracic spine: Secondary | ICD-10-CM | POA: Diagnosis present

## 2022-11-28 DIAGNOSIS — Z8541 Personal history of malignant neoplasm of cervix uteri: Secondary | ICD-10-CM | POA: Diagnosis not present

## 2022-11-28 DIAGNOSIS — M62830 Muscle spasm of back: Secondary | ICD-10-CM | POA: Insufficient documentation

## 2022-11-28 MED ORDER — KETOROLAC TROMETHAMINE 15 MG/ML IJ SOLN
15.0000 mg | Freq: Once | INTRAMUSCULAR | Status: AC
  Administered 2022-11-28: 15 mg via INTRAMUSCULAR
  Filled 2022-11-28: qty 1

## 2022-11-28 MED ORDER — METHOCARBAMOL 500 MG PO TABS: 500.0000 mg | ORAL_TABLET | Freq: Two times a day (BID) | ORAL | 0 refills | Status: DC

## 2022-11-28 MED ORDER — ACETAMINOPHEN 500 MG PO TABS
1000.0000 mg | ORAL_TABLET | Freq: Once | ORAL | Status: AC
  Administered 2022-11-28: 1000 mg via ORAL
  Filled 2022-11-28: qty 2

## 2022-11-28 MED ORDER — ETODOLAC 400 MG PO TABS: 400.0000 mg | ORAL_TABLET | Freq: Two times a day (BID) | ORAL | 0 refills | Status: DC

## 2022-11-28 MED ORDER — LIDOCAINE 5 % EX PTCH: 1.0000 | MEDICATED_PATCH | CUTANEOUS | 0 refills | Status: DC

## 2022-11-28 MED ORDER — LIDOCAINE 5 % EX PTCH
1.0000 | MEDICATED_PATCH | CUTANEOUS | Status: DC
  Administered 2022-11-28: 1 via TRANSDERMAL
  Filled 2022-11-28: qty 1

## 2022-11-28 NOTE — Discharge Instructions (Addendum)
Your exam is most consistent with a muscle spasm.  You received a few medications in the emergency department.  I have prescribed you a muscle relaxer.  This will make you drowsy.  Do not drive or do anything else dangerous after taking this medication.  I have sent Lodine into the pharmacy for you.  This is an anti-inflammatory medication.  Do not combine this with ibuprofen or other NSAIDs.  In addition of these medications you can take Tylenol 1000 mg every 8 hours.

## 2022-11-28 NOTE — ED Triage Notes (Signed)
Pt via POV c/o 3 days of left mid-lateral back pain unrelieved by OTC treatment and massage. Pain rated 6/10 worse with movement.
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
## 2022-12-09 ENCOUNTER — Other Ambulatory Visit (INDEPENDENT_AMBULATORY_CARE_PROVIDER_SITE_OTHER): Payer: Medicaid Other

## 2022-12-09 ENCOUNTER — Encounter: Payer: Self-pay | Admitting: Orthopedic Surgery

## 2022-12-09 ENCOUNTER — Other Ambulatory Visit: Payer: Self-pay

## 2022-12-09 ENCOUNTER — Ambulatory Visit: Payer: Medicaid Other | Admitting: Orthopedic Surgery

## 2022-12-09 VITALS — BP 142/101 | HR 90 | Ht 63.0 in | Wt 180.0 lb

## 2022-12-09 DIAGNOSIS — M25561 Pain in right knee: Secondary | ICD-10-CM

## 2022-12-09 DIAGNOSIS — G8929 Other chronic pain: Secondary | ICD-10-CM

## 2022-12-09 DIAGNOSIS — M25562 Pain in left knee: Secondary | ICD-10-CM | POA: Diagnosis not present

## 2022-12-09 DIAGNOSIS — M2202 Recurrent dislocation of patella, left knee: Secondary | ICD-10-CM | POA: Diagnosis not present

## 2022-12-09 DIAGNOSIS — M2201 Recurrent dislocation of patella, right knee: Secondary | ICD-10-CM | POA: Diagnosis not present

## 2022-12-09 MED ORDER — MELOXICAM 7.5 MG PO TABS: 7.5000 mg | ORAL_TABLET | Freq: Two times a day (BID) | ORAL | 1 refills | Status: DC
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
## 2022-12-09 NOTE — Patient Instructions (Signed)
Physical therapy has been ordered for you at [facility]. They should call you to schedule, [phone] is the phone number to call, if you want to call to schedule.   
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
## 2023-01-13 ENCOUNTER — Ambulatory Visit (HOSPITAL_COMMUNITY): Payer: Medicaid Other

## 2023-01-13 ENCOUNTER — Other Ambulatory Visit: Payer: Self-pay

## 2023-01-13 DIAGNOSIS — M2202 Recurrent dislocation of patella, left knee: Secondary | ICD-10-CM | POA: Insufficient documentation

## 2023-01-13 DIAGNOSIS — M2201 Recurrent dislocation of patella, right knee: Secondary | ICD-10-CM | POA: Insufficient documentation

## 2023-01-13 DIAGNOSIS — R262 Difficulty in walking, not elsewhere classified: Secondary | ICD-10-CM | POA: Diagnosis present

## 2023-01-13 DIAGNOSIS — M25561 Pain in right knee: Secondary | ICD-10-CM | POA: Diagnosis present

## 2023-01-13 DIAGNOSIS — G8929 Other chronic pain: Secondary | ICD-10-CM | POA: Diagnosis present

## 2023-01-13 DIAGNOSIS — M25562 Pain in left knee: Secondary | ICD-10-CM | POA: Diagnosis present

## 2023-01-17 ENCOUNTER — Encounter (HOSPITAL_COMMUNITY): Payer: Medicaid Other

## 2023-01-17 ENCOUNTER — Telehealth (HOSPITAL_COMMUNITY): Payer: Self-pay

## 2023-01-17 NOTE — Telephone Encounter (Signed)
Called patient today for her 1st no show. Patient did not answer and left a voice message. Informed patient about the facility's policies on cancellation/no shows and that she can call if she has questions.  Tish Frederickson. Joelynn Dust, PT, DPT, OCS Board-Certified Clinical Specialist in Orthopedic PT PT Compact Privilege # ([place]): [phone]

## 2023-01-20 ENCOUNTER — Ambulatory Visit (HOSPITAL_COMMUNITY): Payer: Medicaid Other | Attending: Orthopedic Surgery

## 2023-01-20 DIAGNOSIS — G8929 Other chronic pain: Secondary | ICD-10-CM | POA: Diagnosis present

## 2023-01-20 DIAGNOSIS — M25561 Pain in right knee: Secondary | ICD-10-CM | POA: Diagnosis present

## 2023-01-20 DIAGNOSIS — R262 Difficulty in walking, not elsewhere classified: Secondary | ICD-10-CM | POA: Insufficient documentation

## 2023-01-20 DIAGNOSIS — M25562 Pain in left knee: Secondary | ICD-10-CM | POA: Diagnosis present
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
## 2023-01-23 ENCOUNTER — Emergency Department (HOSPITAL_COMMUNITY)
Admission: EM | Admit: 2023-01-23 | Discharge: 2023-01-23 | Disposition: A | Discharge status: Home / Self Care | Payer: Medicaid Other | Attending: Emergency Medicine | Admitting: Emergency Medicine

## 2023-01-23 ENCOUNTER — Emergency Department (HOSPITAL_COMMUNITY): Payer: Medicaid Other

## 2023-01-23 DIAGNOSIS — J0100 Acute maxillary sinusitis, unspecified: Secondary | ICD-10-CM | POA: Insufficient documentation

## 2023-01-23 DIAGNOSIS — R059 Cough, unspecified: Secondary | ICD-10-CM | POA: Diagnosis present

## 2023-01-23 DIAGNOSIS — Z1152 Encounter for screening for COVID-19: Secondary | ICD-10-CM | POA: Insufficient documentation

## 2023-01-23 LAB — SARS CORONAVIRUS 2 BY RT PCR: SARS Coronavirus 2 by RT PCR: NEGATIVE

## 2023-01-23 LAB — GROUP A STREP BY PCR: Group A Strep by PCR: NOT DETECTED

## 2023-01-23 MED ORDER — AMOXICILLIN-POT CLAVULANATE 875-125 MG PO TABS
1.0000 | ORAL_TABLET | Freq: Two times a day (BID) | ORAL | 0 refills | Status: DC
Start: 2023-01-23 — End: 2023-01-23

## 2023-01-23 MED ORDER — BENZONATATE 100 MG PO CAPS
100.0000 mg | ORAL_CAPSULE | Freq: Three times a day (TID) | ORAL | 0 refills | Status: DC
Start: 2023-01-23 — End: 2023-03-01

## 2023-01-23 MED ORDER — CETIRIZINE HCL 10 MG PO TABS
10.0000 mg | ORAL_TABLET | Freq: Every day | ORAL | 0 refills | Status: DC
Start: 2023-01-23 — End: 2023-01-23

## 2023-01-23 MED ORDER — AMOXICILLIN-POT CLAVULANATE 875-125 MG PO TABS
1.0000 | ORAL_TABLET | Freq: Two times a day (BID) | ORAL | 0 refills | Status: DC
Start: 2023-01-23 — End: 2023-02-03

## 2023-01-23 MED ORDER — BENZONATATE 100 MG PO CAPS
100.0000 mg | ORAL_CAPSULE | Freq: Three times a day (TID) | ORAL | 0 refills | Status: DC
Start: 2023-01-23 — End: 2023-01-23

## 2023-01-23 MED ORDER — CETIRIZINE HCL 10 MG PO TABS
10.0000 mg | ORAL_TABLET | Freq: Every day | ORAL | 0 refills | Status: DC
Start: 2023-01-23 — End: 2023-03-01

## 2023-01-23 MED ORDER — FLUTICASONE PROPIONATE 50 MCG/ACT NA SUSP
2.0000 | Freq: Every day | NASAL | 0 refills | Status: DC
Start: 2023-01-23 — End: 2023-03-01

## 2023-01-23 NOTE — Discharge Instructions (Signed)
Take the medications as prescribed  Return for new or worsening symptoms 

## 2023-01-23 NOTE — ED Triage Notes (Signed)
Pt c/o sore throat and neck stiffness "glands are swollen" since last week. Pt also c/o scratchy throat and mucus as well as a productive cough with green mucus

## 2023-01-23 NOTE — ED Provider Notes (Signed)
[place] EMERGENCY DEPARTMENT AT [facility] Provider Note   CSN: [id number] Arrival date & time: 01/23/23  5784    History  Chief Complaint  Patient presents with   Sore Throat    Rebecca [name] is a 32 y.o. female here for evaluation of sore throat and cough.  Diagnosed with COVID 2 weeks ago.  Initially thought her symptoms had improved, about 1 week ago she developed sore throat, congestion, rhinorrhea, worsening cough.  Hurts to swallow.  Productive cough with green sputum.  States sometimes she will cough so hard she is with posttussive emesis.  No fever however has had some chills.  She feels short of breath when she is coughing however no short of breath if she is not coughing.  No hemoptysis, pain lower extremities.  No headache, neck stiffness, neck rigidity, CP, abdominal pain, dysuria, rashes or lesions.  No meds PTA.  She has recently been reexposed to COVID and family members as well.  HPI     Home Medications Prior to Admission medications   Medication Sig Start Date End Date Taking? Authorizing Provider  amoxicillin-clavulanate (AUGMENTIN) 875-125 MG tablet Take 1 tablet by mouth every 12 (twelve) hours. 01/23/23  Yes Nathan Moctezuma A, PA-C  benzonatate (TESSALON) 100 MG capsule Take 1 capsule (100 mg total) by mouth every 8 (eight) hours. 01/23/23  Yes Hershey Knauer A, PA-C  cetirizine (ZYRTEC ALLERGY) 10 MG tablet Take 1 tablet (10 mg total) by mouth daily. 01/23/23  Yes Yessenia Maillet A, PA-C  fluticasone (FLONASE) 50 MCG/ACT nasal spray Place 2 sprays into both nostrils daily. 01/23/23  Yes Scotlyn Mccranie A, PA-C  lidocaine (LIDODERM) 5 % Place 1 patch onto the skin daily. Remove & Discard patch within 12 hours or as directed by MD 11/28/22   Marita Kansas, PA-C  medroxyPROGESTERone (DEPO-PROVERA) 150 MG/ML injection Inject 1 mL (150 mg total) into the muscle every 3 (three) months. 04/05/18   Cheral Marker, CNM  meloxicam (MOBIC) 7.5 MG tablet  Take 1 tablet (7.5 mg total) by mouth in the morning and at bedtime. 12/09/22   Vickki Hearing, MD  methocarbamol (ROBAXIN) 500 MG tablet Take 1 tablet (500 mg total) by mouth 2 (two) times daily. Patient not taking: Reported on 12/09/2022 11/28/22   Marita Kansas, PA-C      Allergies    Nyquil [[id number]-[id number]]    Review of Systems   Review of Systems  Constitutional:  Positive for chills and fatigue.  HENT:  Positive for congestion, postnasal drip, rhinorrhea, sinus pressure, sinus pain and sore throat. Negative for ear discharge, ear pain, facial swelling, nosebleeds, tinnitus and voice change.   Respiratory:  Positive for cough and shortness of breath (with coughing).   Cardiovascular: Negative.   Gastrointestinal:  Positive for vomiting (post tussive emesis).  Genitourinary: Negative.   Musculoskeletal: Negative.   Skin: Negative.   Neurological: Negative.   All other systems reviewed and are negative.   Physical Exam Updated Vital Signs BP (!) 172/76 (BP Location: Left Arm)   Pulse 82   Temp 98.4 F (36.9 C) (Oral)   Resp 17   SpO2 99%  Physical Exam Vitals and nursing note reviewed.  Constitutional:      General: She is not in acute distress.    Appearance: She is well-developed. She is not ill-appearing, toxic-appearing or diaphoretic.  HENT:     Head: Normocephalic and atraumatic.     Jaw: There is normal jaw occlusion.  Comments: No drooling, dysphagia or trismus, no facial swelling    Right Ear: Tympanic membrane and ear canal normal. No swelling or tenderness. No middle ear effusion.     Left Ear: No swelling or tenderness.  No middle ear effusion.     Nose: Congestion and rhinorrhea present.     Comments: Purulent rhinorrhea bilateral nares    Mouth/Throat:     Lips: Pink.     Mouth: Mucous membranes are moist.     Tongue: No lesions. Tongue does not deviate from midline.     Palate: No mass and lesions.     Pharynx: Oropharynx is clear.  Uvula midline. Posterior oropharyngeal erythema and postnasal drip present. No pharyngeal swelling, oropharyngeal exudate or uvula swelling.     Tonsils: No tonsillar exudate or tonsillar abscesses. 0 on the right. 0 on the left.     Comments: Posterior pharynx erythematous, uvula midline.  No deviation, no exudate.  No pooling of secretions.  Sublingual area soft.  No intraoral lesions. Eyes:     Pupils: Pupils are equal, round, and reactive to light.  Neck:     Trachea: Trachea normal. No tracheal tenderness or abnormal tracheal secretions.     Comments: No neck stiffness, neck rigidity.  No hot potato voice Cardiovascular:     Rate and Rhythm: Normal rate and regular rhythm.     Pulses:          Radial pulses are 2+ on the right side and 2+ on the left side.     Heart sounds: Normal heart sounds.  Pulmonary:     Effort: Pulmonary effort is normal. No respiratory distress.     Breath sounds: Normal breath sounds and air entry.     Comments: Speaks in full sentences without difficulty, clear Abdominal:     General: Bowel sounds are normal. There is no distension.     Palpations: Abdomen is soft.     Tenderness: There is no abdominal tenderness.  Musculoskeletal:        General: Normal range of motion.     Cervical back: Full passive range of motion without pain, normal range of motion and neck supple.  Lymphadenopathy:     Cervical: Cervical adenopathy present.  Skin:    General: Skin is warm and dry.     Capillary Refill: Capillary refill takes less than 2 seconds.     Comments: No obvious rashes or lesions on exposed skin  Neurological:     General: No focal deficit present.     Mental Status: She is alert and oriented to person, place, and time.  Psychiatric:        Mood and Affect: Mood normal.     ED Results / Procedures / Treatments   Labs (all labs ordered are listed, but only abnormal results are displayed) Labs Reviewed  GROUP A STREP BY PCR  SARS CORONAVIRUS 2 BY  RT PCR    EKG None  Radiology DG Chest Portable 1 View  Result Date: 01/23/2023 CLINICAL DATA:  Shortness of breath.  Coughing and congestion. EXAM: PORTABLE CHEST 1 VIEW COMPARISON:  10/12/2009 FINDINGS: The lungs are clear without focal pneumonia, edema, pneumothorax or pleural effusion. The cardiopericardial silhouette is within normal limits for size. No acute bony abnormality. IMPRESSION: No active disease. Electronically Signed   By: Kennith Center M.D.   On: 01/23/2023 10:22    Procedures Procedures    Medications Ordered in ED Medications - No data to display  ED  Course/ Medical Decision Making/ A&P    32 year old here for evaluation of URI Sx.  Diagnosed with COVID 2 weeks ago.  Symptoms had improved initially however worsened over the last week.  Now having purulent rhinorrhea, facial pressure, sore throat and posttussive emesis.  Heart and lungs clear.  Posterior pharynx erythematous however uvula midline.  No evidence of PTA, RPA, suspicion for strep pharyngitis.  She has purulent rhinorrhea, congestion and tenderness over her bilateral maxillary sinuses.  She has no neck stiffness, neck rigidity.  She has no hot potato voice.  Her abdomen is soft, tender.  She appears comfortable hydrated.  Will plan on labs, imaging,  Labs and imaging personally viewed and interpreted:  COVID-negative Strep negative Chest x-ray without cardiomegaly, pulm edema, pneumothorax  Reassessed.  We discussed her labs and imaging.  Given duration of symptoms we will treat for acute sinusitis.  Medications also written for her cough.  Low suspicion for sepsis, PNA, PE, pneumothorax, intracranial abnormality, meningitis, venous sinus thrombosis, PTA, RPA.  The patient has been appropriately medically screened and/or stabilized in the ED. I have low suspicion for any other emergent medical condition which would require further screening, evaluation or treatment in the ED or require inpatient  management.  Patient is hemodynamically stable and in no acute distress.  Patient able to ambulate in department prior to ED.  Evaluation does not show acute pathology that would require ongoing or additional emergent interventions while in the emergency department or further inpatient treatment.  I have discussed the diagnosis with the patient and answered all questions.  Pain is been managed while in the emergency department and patient has no further complaints prior to discharge.  Patient is comfortable with plan discussed in room and is stable for discharge at this time.  I have discussed strict return precautions for returning to the emergency department.  Patient was encouraged to follow-up with PCP/specialist refer to at discharge.                                 Medical Decision Making Amount and/or Complexity of Data Reviewed External Data Reviewed: labs, radiology and notes. Labs: ordered. Decision-making details documented in ED Course. Radiology: ordered and independent interpretation performed. Decision-making details documented in ED Course.  Risk OTC drugs. Prescription drug management. Diagnosis or treatment significantly limited by social determinants of health.          Final Clinical Impression(s) / ED Diagnoses Final diagnoses:  Acute non-recurrent maxillary sinusitis    Rx / DC Orders ED Discharge Orders          Ordered    amoxicillin-clavulanate (AUGMENTIN) 875-125 MG tablet  Every 12 hours        01/23/23 1040    fluticasone (FLONASE) 50 MCG/ACT nasal spray  Daily        01/23/23 1040    cetirizine (ZYRTEC ALLERGY) 10 MG tablet  Daily        01/23/23 1040    benzonatate (TESSALON) 100 MG capsule  Every 8 hours        01/23/23 1040              Aryah Doering A, PA-C 01/23/23 1110    Derwood Kaplan, MD 01/26/23 2156

## 2023-01-25 ENCOUNTER — Encounter (HOSPITAL_COMMUNITY): Payer: Medicaid Other

## 2023-01-27 ENCOUNTER — Encounter (HOSPITAL_COMMUNITY): Payer: Medicaid Other

## 2023-01-27 ENCOUNTER — Telehealth (HOSPITAL_COMMUNITY): Payer: Self-pay

## 2023-01-27 NOTE — Telephone Encounter (Signed)
Called clinic today and was able to speak with Patsy Lager to cancel her appointment because her child is sick.  Tish Frederickson. Justise Ehmann, PT, DPT, OCS Board-Certified Clinical Specialist in Orthopedic PT PT Compact Privilege # ([place]): [phone]

## 2023-01-31 ENCOUNTER — Ambulatory Visit (HOSPITAL_COMMUNITY): Payer: Medicaid Other

## 2023-01-31 DIAGNOSIS — R262 Difficulty in walking, not elsewhere classified: Secondary | ICD-10-CM

## 2023-01-31 DIAGNOSIS — G8929 Other chronic pain: Secondary | ICD-10-CM

## 2023-01-31 DIAGNOSIS — M25561 Pain in right knee: Secondary | ICD-10-CM | POA: Diagnosis not present
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
## 2023-02-02 ENCOUNTER — Ambulatory Visit (HOSPITAL_COMMUNITY): Payer: Medicaid Other

## 2023-02-02 DIAGNOSIS — G8929 Other chronic pain: Secondary | ICD-10-CM

## 2023-02-02 DIAGNOSIS — R262 Difficulty in walking, not elsewhere classified: Secondary | ICD-10-CM

## 2023-02-02 DIAGNOSIS — M25561 Pain in right knee: Secondary | ICD-10-CM | POA: Diagnosis not present
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
## 2023-02-03 ENCOUNTER — Ambulatory Visit: Payer: Medicaid Other | Admitting: Orthopedic Surgery

## 2023-02-03 ENCOUNTER — Encounter: Payer: Self-pay | Admitting: Orthopedic Surgery

## 2023-02-03 VITALS — BP 147/93 | HR 81 | Ht 63.0 in | Wt 180.0 lb

## 2023-02-03 DIAGNOSIS — M2202 Recurrent dislocation of patella, left knee: Secondary | ICD-10-CM | POA: Diagnosis not present

## 2023-02-03 DIAGNOSIS — M2201 Recurrent dislocation of patella, right knee: Secondary | ICD-10-CM

## 2023-02-03 NOTE — Progress Notes (Signed)
Follow up   Chief Complaint  Patient presents with   Knee Pain    Bilateral / has pain with activity    32 year old follow-up for subluxating patella  She is here for a physical therapy visit she is on Mobic  Her symptoms have been stable but she is having some back pain with certain physical therapy exercises     Assessment and plan  Patient improving  No diagnosis found.  Continue PT recheck in 2 months

## 2023-02-06 ENCOUNTER — Ambulatory Visit (HOSPITAL_COMMUNITY): Payer: Medicaid Other

## 2023-02-06 DIAGNOSIS — G8929 Other chronic pain: Secondary | ICD-10-CM

## 2023-02-06 DIAGNOSIS — M25561 Pain in right knee: Secondary | ICD-10-CM | POA: Diagnosis not present

## 2023-02-06 DIAGNOSIS — R262 Difficulty in walking, not elsewhere classified: Secondary | ICD-10-CM
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
## 2023-02-08 ENCOUNTER — Ambulatory Visit (HOSPITAL_COMMUNITY): Payer: Medicaid Other

## 2023-02-08 DIAGNOSIS — G8929 Other chronic pain: Secondary | ICD-10-CM

## 2023-02-08 DIAGNOSIS — R262 Difficulty in walking, not elsewhere classified: Secondary | ICD-10-CM

## 2023-02-08 DIAGNOSIS — M25561 Pain in right knee: Secondary | ICD-10-CM | POA: Diagnosis not present
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
## 2023-02-15 ENCOUNTER — Ambulatory Visit (HOSPITAL_COMMUNITY): Payer: Medicaid Other | Attending: Orthopedic Surgery

## 2023-02-15 ENCOUNTER — Encounter (HOSPITAL_COMMUNITY): Payer: Self-pay

## 2023-02-15 DIAGNOSIS — G8929 Other chronic pain: Secondary | ICD-10-CM | POA: Insufficient documentation

## 2023-02-15 DIAGNOSIS — M25561 Pain in right knee: Secondary | ICD-10-CM | POA: Diagnosis present

## 2023-02-15 DIAGNOSIS — M25562 Pain in left knee: Secondary | ICD-10-CM | POA: Insufficient documentation

## 2023-02-15 DIAGNOSIS — R262 Difficulty in walking, not elsewhere classified: Secondary | ICD-10-CM | POA: Diagnosis present
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
## 2023-02-17 ENCOUNTER — Ambulatory Visit (HOSPITAL_COMMUNITY): Payer: Medicaid Other

## 2023-02-17 DIAGNOSIS — G8929 Other chronic pain: Secondary | ICD-10-CM

## 2023-02-17 DIAGNOSIS — M25561 Pain in right knee: Secondary | ICD-10-CM | POA: Diagnosis not present

## 2023-02-17 DIAGNOSIS — R262 Difficulty in walking, not elsewhere classified: Secondary | ICD-10-CM
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
## 2023-02-20 ENCOUNTER — Encounter (HOSPITAL_COMMUNITY): Payer: Medicaid Other

## 2023-02-22 ENCOUNTER — Encounter (HOSPITAL_COMMUNITY): Payer: Self-pay

## 2023-02-22 ENCOUNTER — Ambulatory Visit (HOSPITAL_COMMUNITY): Payer: Medicaid Other

## 2023-02-22 DIAGNOSIS — M25561 Pain in right knee: Secondary | ICD-10-CM | POA: Diagnosis not present

## 2023-02-22 DIAGNOSIS — R262 Difficulty in walking, not elsewhere classified: Secondary | ICD-10-CM

## 2023-02-22 DIAGNOSIS — G8929 Other chronic pain: Secondary | ICD-10-CM
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
## 2023-02-27 ENCOUNTER — Encounter (HOSPITAL_COMMUNITY): Payer: Medicaid Other | Admitting: Physical Therapy

## 2023-02-27 ENCOUNTER — Telehealth: Payer: Self-pay | Admitting: Internal Medicine

## 2023-02-27 NOTE — Telephone Encounter (Signed)
Need new patient approval. Patient called to get this appointment from 10/15 reschedule. Okay to reschedule.

## 2023-02-28 ENCOUNTER — Ambulatory Visit: Payer: Medicaid Other | Admitting: Family Medicine

## 2023-02-28 ENCOUNTER — Telehealth: Payer: Self-pay | Admitting: Family Medicine

## 2023-02-28 NOTE — Telephone Encounter (Signed)
Patient would like to see Malachi Bonds, not Durwin Nora, still approved?

## 2023-02-28 NOTE — Telephone Encounter (Signed)
yes

## 2023-03-01 ENCOUNTER — Ambulatory Visit
Admission: EM | Admit: 2023-03-01 | Discharge: 2023-03-01 | Disposition: A | Discharge status: Home / Self Care | Payer: Medicaid Other | Attending: Nurse Practitioner | Admitting: Nurse Practitioner

## 2023-03-01 DIAGNOSIS — J069 Acute upper respiratory infection, unspecified: Secondary | ICD-10-CM | POA: Diagnosis not present

## 2023-03-01 MED ORDER — CETIRIZINE HCL 10 MG PO TABS: 10.0000 mg | ORAL_TABLET | Freq: Every day | ORAL | 0 refills | Status: DC

## 2023-03-01 MED ORDER — FLUTICASONE PROPIONATE 50 MCG/ACT NA SUSP: 2.0000 | Freq: Every day | NASAL | 0 refills | Status: DC

## 2023-03-01 MED ORDER — BENZONATATE 200 MG PO CAPS: 200.0000 mg | ORAL_CAPSULE | Freq: Three times a day (TID) | ORAL | 0 refills | Status: DC | PRN

## 2023-03-01 MED ORDER — ALBUTEROL SULFATE HFA 108 (90 BASE) MCG/ACT IN AERS: 2.0000 | INHALATION_SPRAY | Freq: Four times a day (QID) | RESPIRATORY_TRACT | 0 refills | Status: AC | PRN

## 2023-03-01 MED ORDER — PREDNISONE 20 MG PO TABS: 40.0000 mg | ORAL_TABLET | Freq: Every day | ORAL | 0 refills | Status: AC
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
## 2023-03-01 NOTE — Discharge Instructions (Signed)
Take medication as prescribed. Increase fluids and allow for plenty of rest. May take over-the-counter Tylenol or ibuprofen as needed for pain, fever, or general discomfort. Warm salt water gargles 3-4 times daily as needed for throat pain or discomfort. Recommend using a humidifier in your bedroom at nighttime during sleep and sleeping elevated on pillows while cough symptoms persist. Follow-up in this clinic or with your primary care physician if symptoms do not improve over the next several days, or if symptoms appear to be worsening. Follow-up as needed.

## 2023-03-01 NOTE — ED Triage Notes (Signed)
Pt c/o sore throat cough x 2 days wheezing and chest pain. Pt has used Theraflu to help with cough, coughing up phlegm

## 2023-03-03 ENCOUNTER — Encounter (HOSPITAL_COMMUNITY): Payer: Medicaid Other

## 2023-03-06 ENCOUNTER — Encounter (HOSPITAL_COMMUNITY): Payer: Medicaid Other

## 2023-03-08 ENCOUNTER — Encounter (HOSPITAL_COMMUNITY): Payer: Medicaid Other

## 2023-03-09 ENCOUNTER — Other Ambulatory Visit: Payer: Self-pay

## 2023-03-09 ENCOUNTER — Emergency Department (HOSPITAL_COMMUNITY): Payer: Medicaid Other

## 2023-03-09 ENCOUNTER — Emergency Department (HOSPITAL_COMMUNITY)
Admission: EM | Admit: 2023-03-09 | Discharge: 2023-03-09 | Disposition: A | Discharge status: Home / Self Care | Payer: Medicaid Other | Attending: Emergency Medicine | Admitting: Emergency Medicine

## 2023-03-09 ENCOUNTER — Encounter (HOSPITAL_COMMUNITY): Payer: Self-pay

## 2023-03-09 DIAGNOSIS — R0602 Shortness of breath: Secondary | ICD-10-CM | POA: Diagnosis present

## 2023-03-09 DIAGNOSIS — J40 Bronchitis, not specified as acute or chronic: Secondary | ICD-10-CM | POA: Diagnosis not present

## 2023-03-09 DIAGNOSIS — Z20822 Contact with and (suspected) exposure to covid-19: Secondary | ICD-10-CM | POA: Insufficient documentation

## 2023-03-09 LAB — RESP PANEL BY RT-PCR (RSV, FLU A&B, COVID)  RVPGX2
Influenza A by PCR: NEGATIVE
Influenza B by PCR: NEGATIVE
Resp Syncytial Virus by PCR: NEGATIVE
SARS Coronavirus 2 by RT PCR: NEGATIVE

## 2023-03-09 MED ORDER — ALBUTEROL SULFATE HFA 108 (90 BASE) MCG/ACT IN AERS: 2.0000 | INHALATION_SPRAY | RESPIRATORY_TRACT | Status: DC | PRN

## 2023-03-09 MED ORDER — AMOXICILLIN-POT CLAVULANATE 875-125 MG PO TABS
1.0000 | ORAL_TABLET | Freq: Two times a day (BID) | ORAL | 0 refills | Status: DC
Start: 2023-03-09 — End: 2023-03-30

## 2023-03-09 NOTE — Discharge Instructions (Signed)
Continue to use the inhaler increasing to 2 puffs every 3-4 hours if this helps with cough. Use the spacer device we discussed.   Take Augmentin as prescribed for 7 days.   Return to the emergency department with any new or worsening symptoms.

## 2023-03-09 NOTE — ED Provider Notes (Signed)
EMERGENCY DEPARTMENT AT [facility] Provider Note   CSN: [id number] Arrival date & time: 03/09/23  1552     History  Chief Complaint  Patient presents with   Shortness of Breath    Rebecca [name] is a 32 y.o. female.  Patient to ED with URI symptoms of afebrile cough, chest tightness x 2 weeks. She reports her children have had similar symptoms with her daughter being diagnosed with flu today. She was seen at Urgent Care 6 days ago and started on 40 mg prednisone daily x 5 days, albuterol inhaler prn, tessalon perles and Zyrtec, which she reports has not helped.   The history is provided by the patient. No language interpreter was used.  Shortness of Breath      Home Medications Prior to Admission medications   Medication Sig Start Date End Date Taking? Authorizing Provider  amoxicillin-clavulanate (AUGMENTIN) 875-125 MG tablet Take 1 tablet by mouth every 12 (twelve) hours. 03/09/23  Yes Anshu Wehner, Melvenia Beam, PA-C  albuterol (VENTOLIN HFA) 108 (90 Base) MCG/ACT inhaler Inhale 2 puffs into the lungs every 6 (six) hours as needed for wheezing or shortness of breath. 03/01/23   Leath-Warren, Sadie Haber, NP  benzonatate (TESSALON) 200 MG capsule Take 1 capsule (200 mg total) by mouth 3 (three) times daily as needed for cough. 03/01/23   Leath-Warren, Sadie Haber, NP  cetirizine (ZYRTEC) 10 MG tablet Take 1 tablet (10 mg total) by mouth daily. 03/01/23   Leath-Warren, Sadie Haber, NP  fluticasone (FLONASE) 50 MCG/ACT nasal spray Place 2 sprays into both nostrils daily. 03/01/23   Leath-Warren, Sadie Haber, NP  lidocaine (LIDODERM) 5 % Place 1 patch onto the skin daily. Remove & Discard patch within 12 hours or as directed by MD 11/28/22   Marita Kansas, PA-C  medroxyPROGESTERone (DEPO-PROVERA) 150 MG/ML injection Inject 1 mL (150 mg total) into the muscle every 3 (three) months. 04/05/18   Cheral Marker, CNM  meloxicam (MOBIC) 7.5 MG tablet Take 1 tablet (7.5 mg  total) by mouth in the morning and at bedtime. 12/09/22   Vickki Hearing, MD  methocarbamol (ROBAXIN) 500 MG tablet Take 1 tablet (500 mg total) by mouth 2 (two) times daily. Patient not taking: Reported on 02/03/2023 11/28/22   Marita Kansas, PA-C      Allergies    Nyquil [pseudoeph-doxylamine-dm-apap]    Review of Systems   Review of Systems  Respiratory:  Positive for shortness of breath.     Physical Exam Updated Vital Signs BP (!) 165/113 (BP Location: Right Arm)   Pulse 90   Temp 98.7 F (37.1 C)   Resp 20   Ht 5\' 4"  (1.626 m)   Wt 80.7 kg   SpO2 96%   BMI 30.55 kg/m  Physical Exam Vitals and nursing note reviewed.  Constitutional:      Appearance: She is well-developed. She is not ill-appearing.  HENT:     Mouth/Throat:     Mouth: Mucous membranes are moist.  Cardiovascular:     Rate and Rhythm: Normal rate and regular rhythm.     Heart sounds: No murmur heard. Pulmonary:     Effort: Pulmonary effort is normal.     Breath sounds: No wheezing, rhonchi or rales.     Comments: Actively coughing Musculoskeletal:     Cervical back: Normal range of motion.  Skin:    General: Skin is warm and dry.  Neurological:     Mental Status: She is alert.  ED Results / Procedures / Treatments   Labs (all labs ordered are listed, but only abnormal results are displayed) Labs Reviewed  RESP PANEL BY RT-PCR (RSV, FLU A&B, COVID)  [id number]   Results for orders placed or performed during the hospital encounter of 03/09/23  Resp panel by RT-PCR (RSV, Flu A&B, Covid) Anterior Nasal Swab   Specimen: Anterior Nasal Swab  Result Value Ref Range   SARS Coronavirus 2 by RT PCR NEGATIVE NEGATIVE   Influenza A by PCR NEGATIVE NEGATIVE   Influenza B by PCR NEGATIVE NEGATIVE   Resp Syncytial Virus by PCR NEGATIVE NEGATIVE     EKG None  Radiology DG Chest 2 View  Result Date: 03/09/2023 CLINICAL DATA:  Shortness of breath, cough and wheezing EXAM: CHEST - 2 VIEW  COMPARISON:  01/23/2023 FINDINGS: Cardiac and mediastinal contours are within normal limits. No focal pulmonary opacity. No pleural effusion or pneumothorax. No acute osseous abnormality. IMPRESSION: No acute cardiopulmonary process. Electronically Signed   By: Wiliam Ke M.D.   On: 03/09/2023 17:04    Procedures Procedures    Medications Ordered in ED Medications  albuterol (VENTOLIN HFA) 108 (90 Base) MCG/ACT inhaler 2 puff (has no administration in time range)    ED Course/ Medical Decision Making/ A&P Clinical Course as of 03/09/23 1821  Thu Mar 09, 2023  1818 Sxs of cough and chest tightness x 2 weeks. No fever. Reports she stopped smoking 3 months ago. No history of asthma. No relief with recently prescribed medications. Will prescribe antibiotic based on duration of symptoms. Increase albuterol use to 2 puffs every 3-4 hours if this helps with cough. Follow up with PCP as soon as appointment is available. Return to ED with worsening symptoms prn.  [SU]    Clinical Course User Index [SU] Elpidio Anis, PA-C                                 Medical Decision Making Amount and/or Complexity of Data Reviewed Radiology: ordered.  Risk Prescription drug management.           Final Clinical Impression(s) / ED Diagnoses Final diagnoses:  Bronchitis    Rx / DC Orders ED Discharge Orders          Ordered    amoxicillin-clavulanate (AUGMENTIN) 875-125 MG tablet  Every 12 hours        03/09/23 1820              Elpidio Anis, PA-C 03/09/23 1821    Terrilee Files, MD 03/10/23 1032

## 2023-03-09 NOTE — ED Triage Notes (Signed)
Dx'ed with URI 10/16 Complains of cough and wheezing Taking tessalon pearls and inhaler without relief.   Daughter dx'ed flu today.  Pt and 2 children had COVID in [place]

## 2023-03-30 ENCOUNTER — Ambulatory Visit: Payer: Medicaid Other | Admitting: Family Medicine

## 2023-03-30 ENCOUNTER — Encounter: Payer: Self-pay | Admitting: Family Medicine

## 2023-03-30 VITALS — BP 129/90 | HR 88 | Ht 63.0 in | Wt 176.0 lb

## 2023-03-30 DIAGNOSIS — Z789 Other specified health status: Secondary | ICD-10-CM

## 2023-03-30 DIAGNOSIS — Z1159 Encounter for screening for other viral diseases: Secondary | ICD-10-CM

## 2023-03-30 DIAGNOSIS — R7301 Impaired fasting glucose: Secondary | ICD-10-CM | POA: Diagnosis not present

## 2023-03-30 DIAGNOSIS — K625 Hemorrhage of anus and rectum: Secondary | ICD-10-CM

## 2023-03-30 DIAGNOSIS — I10 Essential (primary) hypertension: Secondary | ICD-10-CM | POA: Diagnosis not present

## 2023-03-30 DIAGNOSIS — Z23 Encounter for immunization: Secondary | ICD-10-CM | POA: Diagnosis not present

## 2023-03-30 DIAGNOSIS — K649 Unspecified hemorrhoids: Secondary | ICD-10-CM | POA: Diagnosis not present

## 2023-03-30 DIAGNOSIS — Z114 Encounter for screening for human immunodeficiency virus [HIV]: Secondary | ICD-10-CM

## 2023-03-30 DIAGNOSIS — E559 Vitamin D deficiency, unspecified: Secondary | ICD-10-CM

## 2023-03-30 DIAGNOSIS — E7849 Other hyperlipidemia: Secondary | ICD-10-CM

## 2023-03-30 DIAGNOSIS — E038 Other specified hypothyroidism: Secondary | ICD-10-CM

## 2023-03-30 MED ORDER — AMLODIPINE BESYLATE 5 MG PO TABS
5.0000 mg | ORAL_TABLET | Freq: Every day | ORAL | 1 refills | Status: DC
Start: 2023-03-30 — End: 2023-06-05

## 2023-03-30 MED ORDER — HYDROCORTISONE ACETATE 25 MG RE SUPP
25.0000 mg | Freq: Two times a day (BID) | RECTAL | 1 refills | Status: DC
Start: 2023-03-30 — End: 2023-04-11
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
## 2023-03-30 NOTE — Patient Instructions (Signed)
I appreciate the opportunity to provide care to you today!    Follow up: 1  month BP  Labs: please stop by the lab today/during the week to get your blood drawn (CBC, CMP, TSH, Lipid profile, HgA1c, Vit D)  Screening: HIV and Hep C  Hypertension Management  Your current blood pressure is above the target goal of <140/90 mmHg. To address this, please start taking amlodipine 5 mg daily   Medication Instructions: Take your blood pressure medication at the same time each day. After taking your medication, check your blood pressure at least an hour later. If your first reading is >140/90 mmHg, wait at least 10 minutes and recheck your blood pressure. Side Effects: In the initial days of therapy, you may experience dizziness or lightheadedness as your body adjusts to the lower blood pressure; this is expected. Diet and Lifestyle: Adhere to a low-sodium diet, limiting intake to less than 1500 mg daily, and increase your physical activity. Avoid over-the-counter NSAIDs such as ibuprofen and naproxen while on this medication. Hydration and Nutrition: Stay well-hydrated by drinking at least 64 ounces of water daily. Increase your servings of fruits and vegetables and avoid excessive sodium in your diet. Long-Term Considerations: Uncontrolled hypertension can increase the risk of cardiovascular diseases, including stroke, coronary artery disease, and heart failure.  Please report to the emergency department if your blood pressure exceeds 180/120 and is accompanied by symptoms such as headaches, chest pain, palpitations, blurred vision, or dizziness.   Referrals today-  GI  Attached with your AVS, you will find valuable resources for self-education. I highly recommend dedicating some time to thoroughly examine them.   Please continue to a heart-healthy diet and increase your physical activities. Try to exercise for at least five days a week.    It was a pleasure to see you and I look forward  to continuing to work together on your health and well-being. Please do not hesitate to call the office if you need care or have questions about your care.  In case of emergency, please visit the Emergency Department for urgent care, or contact our clinic at [phone] to schedule an appointment. We're here to help you!   Have a wonderful day and week. With Gratitude, Gilmore Laroche MSN, FNP-BC

## 2023-03-30 NOTE — Assessment & Plan Note (Addendum)
Uncontrolled Hypertension Management  Your current blood pressure is above the target goal of <140/90 mmHg. To address this, please start taking amlodipine 5 mg daily and   Medication Instructions: Take your blood pressure medication at the same time each day. After taking your medication, check your blood pressure at least an hour later. If your first reading is >140/90 mmHg, wait at least 10 minutes and recheck your blood pressure. Side Effects: In the initial days of therapy, you may experience dizziness or lightheadedness as your body adjusts to the lower blood pressure; this is expected. Diet and Lifestyle: Adhere to a low-sodium diet, limiting intake to less than 1500 mg daily, and increase your physical activity. Avoid over-the-counter NSAIDs such as ibuprofen and naproxen while on this medication. Hydration and Nutrition: Stay well-hydrated by drinking at least 64 ounces of water daily. Increase your servings of fruits and vegetables and avoid excessive sodium in your diet. Long-Term Considerations: Uncontrolled hypertension can increase the risk of cardiovascular diseases, including stroke, coronary artery disease, and heart failure.  Please report to the emergency department if your blood pressure exceeds 180/120 and is accompanied by symptoms such as headaches, chest pain, palpitations, blurred vision, or dizziness.

## 2023-03-30 NOTE — Assessment & Plan Note (Signed)
Referral placed to GI for evaluation of rectal bleeding. Currently asymptomatic. CBC pending. Denies symptoms of constipation.

## 2023-03-30 NOTE — Assessment & Plan Note (Signed)
Patient educated on CDC recommendation for the vaccine. Verbal consent was obtained from the patient, vaccine administered by nurse, no sign of adverse reactions noted at this time. Patient education on arm soreness and use of tylenol or ibuprofen for this patient  was discussed. Patient educated on the signs and symptoms of adverse effect and advise to contact the office if they occur.  

## 2023-03-30 NOTE — Assessment & Plan Note (Signed)
The patient wishes to defer the referral to OB/GYN. Will follow up at her next visit.

## 2023-03-31 LAB — CBC WITH DIFFERENTIAL/PLATELET
Basophils Absolute: 0.1 10*3/uL (ref 0.0–0.2)
Basos: 1 %
EOS (ABSOLUTE): 0 10*3/uL (ref 0.0–0.4)
Eos: 1 %
Hematocrit: 42.1 % (ref 34.0–46.6)
Hemoglobin: 13.7 g/dL (ref 11.1–15.9)
Immature Grans (Abs): 0 10*3/uL (ref 0.0–0.1)
Immature Granulocytes: 0 %
Lymphocytes Absolute: 2 10*3/uL (ref 0.7–3.1)
Lymphs: 35 %
MCH: 27.7 pg (ref 26.6–33.0)
MCHC: 32.5 g/dL (ref 31.5–35.7)
MCV: 85 fL (ref 79–97)
Monocytes Absolute: 0.4 10*3/uL (ref 0.1–0.9)
Monocytes: 7 %
Neutrophils Absolute: 3.3 10*3/uL (ref 1.4–7.0)
Neutrophils: 56 %
Platelets: 382 10*3/uL (ref 150–450)
RBC: 4.94 x10E6/uL (ref 3.77–5.28)
RDW: 11.9 % (ref 11.7–15.4)
WBC: 5.8 10*3/uL (ref 3.4–10.8)

## 2023-03-31 LAB — CMP14+EGFR
ALT: 12 [IU]/L (ref 0–32)
AST: 15 [IU]/L (ref 0–40)
Albumin: 4.6 g/dL (ref 3.9–4.9)
Alkaline Phosphatase: 105 [IU]/L (ref 44–121)
BUN/Creatinine Ratio: 16 (ref 9–23)
BUN: 13 mg/dL (ref 6–20)
Bilirubin Total: 0.6 mg/dL (ref 0.0–1.2)
CO2: 21 mmol/L (ref 20–29)
Calcium: 10.1 mg/dL (ref 8.7–10.2)
Chloride: 102 mmol/L (ref 96–106)
Creatinine, Ser: 0.83 mg/dL (ref 0.57–1.00)
Globulin, Total: 2.6 g/dL (ref 1.5–4.5)
Glucose: 101 mg/dL — ABNORMAL HIGH (ref 70–99)
Potassium: 4.7 mmol/L (ref 3.5–5.2)
Sodium: 142 mmol/L (ref 134–144)
Total Protein: 7.2 g/dL (ref 6.0–8.5)
eGFR: 95 mL/min/{1.73_m2} (ref >59)

## 2023-03-31 LAB — LIPID PANEL
Chol/HDL Ratio: 2.5 ratio (ref 0.0–4.4)
Cholesterol, Total: 203 mg/dL — ABNORMAL HIGH (ref 100–199)
HDL: 80 mg/dL (ref >39)
LDL Chol Calc (NIH): 108 mg/dL — ABNORMAL HIGH (ref 0–99)
Triglycerides: 83 mg/dL (ref 0–149)
VLDL Cholesterol Cal: 15 mg/dL (ref 5–40)

## 2023-03-31 LAB — VITAMIN D 25 HYDROXY (VIT D DEFICIENCY, FRACTURES): Vit D, 25-Hydroxy: 13.7 ng/mL — ABNORMAL LOW (ref 30.0–100.0)

## 2023-03-31 LAB — HEPATITIS C ANTIBODY: Hep C Virus Ab: NONREACTIVE

## 2023-03-31 LAB — TSH+FREE T4
Free T4: 1.26 ng/dL (ref 0.82–1.77)
TSH: 1.25 u[IU]/mL (ref 0.450–4.500)

## 2023-03-31 LAB — HEMOGLOBIN A1C
Est. average glucose Bld gHb Est-mCnc: 114 mg/dL
Hgb A1c MFr Bld: 5.6 % (ref 4.8–5.6)

## 2023-03-31 LAB — HIV ANTIBODY (ROUTINE TESTING W REFLEX): HIV Screen 4th Generation wRfx: NONREACTIVE

## 2023-04-03 ENCOUNTER — Ambulatory Visit: Payer: Medicaid Other | Admitting: Orthopedic Surgery

## 2023-04-03 VITALS — Ht 63.0 in | Wt 176.0 lb

## 2023-04-03 DIAGNOSIS — M2201 Recurrent dislocation of patella, right knee: Secondary | ICD-10-CM | POA: Diagnosis not present

## 2023-04-03 DIAGNOSIS — E559 Vitamin D deficiency, unspecified: Secondary | ICD-10-CM | POA: Diagnosis not present

## 2023-04-03 NOTE — Patient Instructions (Signed)
While we are working on your approval for MRI please go ahead and call to schedule your appointment with [facility] within at least one (1) week.   Central Scheduling [phone]  Go to [facility] for lab

## 2023-04-03 NOTE — Progress Notes (Signed)
Chief Complaint  Patient presents with   Follow-up    Recheck on right knee.   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  She says the patellas go out of place and go on the side of her knee.  She is worn a knee immobilizer once or twice never had physical therapy usually goes to the ER or pop some back in herself   She does complain of some mechanical symptoms  Orthopedic examination shows she has mild signs of ligament laxity with the thumb test. 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

## 2023-04-04 LAB — VITAMIN D 25 HYDROXY (VIT D DEFICIENCY, FRACTURES): Vit D, 25-Hydroxy: 13 ng/mL — ABNORMAL LOW (ref 30–100)

## 2023-04-06 ENCOUNTER — Other Ambulatory Visit: Payer: Self-pay | Admitting: Family Medicine

## 2023-04-06 ENCOUNTER — Ambulatory Visit: Payer: Medicaid Other | Admitting: Orthopedic Surgery

## 2023-04-06 DIAGNOSIS — E559 Vitamin D deficiency, unspecified: Secondary | ICD-10-CM

## 2023-04-06 MED ORDER — VITAMIN D (ERGOCALCIFEROL) 1.25 MG (50000 UNIT) PO CAPS
50000.0000 [IU] | ORAL_CAPSULE | ORAL | 1 refills | Status: DC
Start: 2023-04-06 — End: 2023-09-12

## 2023-04-09 ENCOUNTER — Ambulatory Visit (HOSPITAL_COMMUNITY): Payer: Medicaid Other

## 2023-04-09 ENCOUNTER — Encounter (HOSPITAL_COMMUNITY): Payer: Self-pay
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
## 2023-04-11 ENCOUNTER — Encounter: Payer: Self-pay | Admitting: Gastroenterology

## 2023-04-11 ENCOUNTER — Ambulatory Visit: Payer: Medicaid Other | Admitting: Gastroenterology

## 2023-04-11 VITALS — BP 132/84 | HR 91 | Temp 98.4°F | Ht 64.0 in | Wt 179.8 lb

## 2023-04-11 DIAGNOSIS — R197 Diarrhea, unspecified: Secondary | ICD-10-CM

## 2023-04-11 DIAGNOSIS — R109 Unspecified abdominal pain: Secondary | ICD-10-CM

## 2023-04-11 DIAGNOSIS — K625 Hemorrhage of anus and rectum: Secondary | ICD-10-CM | POA: Diagnosis not present

## 2023-04-11 DIAGNOSIS — K642 Third degree hemorrhoids: Secondary | ICD-10-CM

## 2023-04-11 DIAGNOSIS — K529 Noninfective gastroenteritis and colitis, unspecified: Secondary | ICD-10-CM | POA: Diagnosis not present

## 2023-04-11 DIAGNOSIS — R1084 Generalized abdominal pain: Secondary | ICD-10-CM

## 2023-04-11 MED ORDER — HYDROCORTISONE (PERIANAL) 2.5 % EX CREA: 1.0000 | TOPICAL_CREAM | Freq: Two times a day (BID) | CUTANEOUS | 1 refills | Status: DC | PRN

## 2023-04-11 NOTE — Patient Instructions (Signed)
Start anusol cream anorectally twice a day as needed for hemorrhoid pain/itching/bleeding. Please complete labs at [facility]. We will schedule you for a colonoscopy in the near future to evaluate your diarrhea and bleeding.

## 2023-04-14 LAB — SEDIMENTATION RATE: Sed Rate: 5 mm/h (ref 0–32)

## 2023-04-14 LAB — C-REACTIVE PROTEIN: CRP: 4 mg/L (ref 0–10)

## 2023-04-14 LAB — TISSUE TRANSGLUTAMINASE, IGA: Transglutaminase IgA: <2 U/mL (ref 0–3)

## 2023-04-14 LAB — IGA: IgA/Immunoglobulin A, Serum: 184 mg/dL (ref 87–352)

## 2023-04-18 ENCOUNTER — Ambulatory Visit (HOSPITAL_COMMUNITY)
Admission: RE | Admit: 2023-04-18 | Discharge: 2023-04-18 | Disposition: A | Discharge status: Home / Self Care | Payer: Medicaid Other | Source: Ambulatory Visit | Attending: Orthopedic Surgery | Admitting: Orthopedic Surgery

## 2023-04-18 DIAGNOSIS — M2201 Recurrent dislocation of patella, right knee: Secondary | ICD-10-CM | POA: Insufficient documentation

## 2023-04-21 ENCOUNTER — Encounter: Payer: Self-pay | Admitting: Allergy & Immunology

## 2023-04-21 ENCOUNTER — Ambulatory Visit: Payer: Medicaid Other | Admitting: Allergy & Immunology

## 2023-04-21 VITALS — BP 120/74 | HR 90 | Temp 98.3°F | Resp 20 | Ht 64.0 in | Wt 182.1 lb

## 2023-04-21 DIAGNOSIS — J310 Chronic rhinitis: Secondary | ICD-10-CM | POA: Diagnosis not present

## 2023-04-21 DIAGNOSIS — R053 Chronic cough: Secondary | ICD-10-CM

## 2023-04-21 MED ORDER — FLUTICASONE-SALMETEROL 250-50 MCG/ACT IN AEPB: 1.0000 | INHALATION_SPRAY | Freq: Two times a day (BID) | RESPIRATORY_TRACT | 5 refills | Status: DC

## 2023-04-21 MED ORDER — MONTELUKAST SODIUM 10 MG PO TABS: 10.0000 mg | ORAL_TABLET | Freq: Every day | ORAL | 1 refills | Status: DC

## 2023-04-21 NOTE — Patient Instructions (Addendum)
1. Chronic cough - Lung testing actually looks good today. - We are going to start a daily controller medication in case you are developing asthma. - This should help prevent the coughing with consistent use of the inhaler.  - Advair contains an inhaled steroid and a long acting albuterol to help prevent inflammation associated with asthma. - Daily controller medication(s): Singulair 10mg  daily and Advair 250/74mcg one puff twice daily - Prior to physical activity: albuterol 2 puffs 10-15 minutes before physical activity. - Rescue medications: albuterol 4 puffs every 4-6 hours as needed - Asthma control goals:  * Full participation in all desired activities (may need albuterol before activity) * Albuterol use two time or less a week on average (not counting use with activity) * Cough interfering with sleep two time or less a month * Oral steroids no more than once a year * No hospitalizations  2. Chronic rhinitis - Because of insurance stipulations, we cannot do skin testing on the same day as your first visit. - We are all working to fight this, but for now we need to do two separate visits.  - We will know more after we do testing at the next visit.  - The skin testing visit can be squeezed in at your convenience.  - Then we can make a more full plan to address all of your symptoms. - Be sure to stop your antihistamines for 3 days before this appointment.  - In the meantime, continue with the Flonase one spray per nostril twice daily. - Stop the Zyrtec since you are out of refills and did not notice an improvement anyway.  - Start Singulair (montelukast) 10mg  once daily. - It can cause irritability and mood changes, so beware of this rare side effect.   3. Return in about 2 weeks (around 05/05/2023) for SKIN TESTING (1-55). You can have the follow up appointment with Dr. Dellis Anes or a Nurse Practicioner (our Nurse Practitioners are excellent and always have Physician oversight!).     Please inform us of any Emergency Department visits, hospitalizations, or changes in symptoms. Call us before going to the ED for breathing or allergy symptoms since we might be able to fit you in for a sick visit. Feel free to contact us anytime with any questions, problems, or concerns.  It was a pleasure to see you again today!  Websites that have reliable patient information: 1. American Academy of Asthma, Allergy, and Immunology: www.aaaai.org 2. Food Allergy Research and Education (FARE): foodallergy.org 3. Mothers of Asthmatics: http://www.asthmacommunitynetwork.org 4. American College of Allergy, Asthma, and Immunology: www.acaai.org      "Like" [place] on Facebook and Instagram for our latest updates!      A healthy democracy works best when [employer] participate! Make sure you are registered to vote! If you have moved or changed any of your contact information, you will need to get this updated before voting! Scan the QR codes below to learn more!

## 2023-04-21 NOTE — Progress Notes (Signed)
NEW PATIENT  Date of Service/Encounter:  04/21/23  Consult requested by: Gilmore Laroche, FNP   Assessment:   Chronic cough  Chronic rhinitis  Plan/Recommendations:   1. Chronic cough - Lung testing actually looks good today. - We are going to start a daily controller medication in case you are developing asthma. - This should help prevent the coughing with consistent use of the inhaler.  - Advair contains an inhaled steroid and a long acting albuterol to help prevent inflammation associated with asthma. - Daily controller medication(s): Singulair 10mg  daily and Advair 250/30mcg one puff twice daily - Prior to physical activity: albuterol 2 puffs 10-15 minutes before physical activity. - Rescue medications: albuterol 4 puffs every 4-6 hours as needed - Asthma control goals:  * Full participation in all desired activities (may need albuterol before activity) * Albuterol use two time or less a week on average (not counting use with activity) * Cough interfering with sleep two time or less a month * Oral steroids no more than once a year * No hospitalizations  2. Chronic rhinitis - Because of insurance stipulations, we cannot do skin testing on the same day as your first visit. - We are all working to fight this, but for now we need to do two separate visits.  - We will know more after we do testing at the next visit.  - The skin testing visit can be squeezed in at your convenience.  - Then we can make a more full plan to address all of your symptoms. - Be sure to stop your antihistamines for 3 days before this appointment.  - In the meantime, continue with the Flonase one spray per nostril twice daily. - Stop the Zyrtec since you are out of refills and did not notice an improvement anyway.  - Start Singulair (montelukast) 10mg  once daily. - It can cause irritability and mood changes, so beware of this rare side effect.   3. Return in about 2 weeks (around 05/05/2023) for  SKIN TESTING (1-55). You can have the follow up appointment with Dr. Dellis Anes or a Nurse Practicioner (our Nurse Practitioners are excellent and always have Physician oversight!).    This note in its entirety was forwarded to the Provider who requested this consultation.  Subjective:   Rebecca [name] is a 32 y.o. female presenting today for evaluation of  Chief Complaint  Patient presents with   Asthma    Cough, wheezing, can't catch her breathe   Allergic Rhinitis    Eczema    At times- when weather changes     Rebecca [name] has a history of the following: Patient Active Problem List   Diagnosis Date Noted   Diarrhea 04/11/2023   Abdominal pain 04/11/2023   Hypertension 03/30/2023   Rectal bleeding 03/30/2023   Uses contraception 03/30/2023   Encounter for immunization 03/30/2023   High grade squamous intraepithelial cervical dysplasia    History of gestational hypertension 03/03/2018   History of abnormal cervical Pap smear 08/31/2017   History of chlamydia 08/22/2017   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  Discussed the use of AI scribe software for clinical note transcription with the patient and/or guardian, who gave verbal consent to proceed.  Rebecca [name] was referred by Gilmore Laroche, FNP.     Teasa is a 32 y.o. female presenting for an evaluation of asthma and allergies .   Asthma/Respiratory Symptom History: Rebecca [name] is a previously healthy  female presenting with a recent increase in coughing and chest discomfort. She reports a recent illness, initially presenting as a minor cold, which progressed to nocturnal awakenings due to coughing and congestion. She initially attributed these symptoms to a similar illness in her children; however, the symptoms persisted despite treatment with antibiotics and an inhaler. She has a significant smoking history, having quit approximately four months  ago. She notes that her coughing and chest discomfort were present while smoking and have persisted since cessation. The patient also reports a dry mouth, which she attributes to her current medications. Her cough is described as constant, with an increase in severity leading to a hospital visit. She describes a sensation of needing to cough when inhaling, which has evolved to a dry cough. Despite being prescribed an albuterol inhaler, the patient reports ongoing symptoms.  Allergic Rhinitis Symptom History: She also reports a history of allergies, with some relief from a prescribed nasal spray and Zyrtec. However, she has run out of Zyrtec and has not had a refill.   The patient has three children, one of whom is also being treated for similar respiratory symptoms. The patient expresses concern about her children's health and the impact of her own health on her ability to care for her children.   Otherwise, there is no history of other atopic diseases, including asthma, food allergies, drug allergies, stinging insect allergies, or contact dermatitis. There is no significant infectious history. Vaccinations are up to date.    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   Benadryl Itch Stopping [diphenhydramine-zinc Acetate]    Liquid form        Medication List         Accurate as of April 21, 2023 12:50 PM. If you have any questions, ask your nurse or doctor.          albuterol 108 (90 Base) MCG/ACT inhaler Commonly known as: VENTOLIN HFA Inhale 2 puffs into the lungs every 6 (six) hours as needed for wheezing or shortness of breath.   amLODipine 5 MG tablet Commonly known as: NORVASC Take 1 tablet (5 mg total) by mouth daily.   cetirizine 10 MG tablet Commonly known as: ZYRTEC Take 1 tablet (10 mg total) by mouth daily.   fluticasone 50 MCG/ACT nasal spray Commonly known as: FLONASE Place 2 sprays into both nostrils daily.   fluticasone-salmeterol 250-50 MCG/ACT Aepb Commonly known as: Advair Diskus Inhale 1 puff into the lungs in the morning and at bedtime. Started by: Alfonse Spruce   hydrocortisone 2.5 % rectal cream Commonly known as: ANUSOL-HC Place 1 Application rectally 2 (two) times daily as needed for hemorrhoids or anal itching.   montelukast 10 MG tablet Commonly known as: Singulair Take 1 tablet (10 mg total) by mouth at bedtime. Started by: Alfonse Spruce   Vitamin D (Ergocalciferol) 1.25 MG (50000 UNIT) Caps capsule  Commonly known as: DRISDOL Take 1 capsule (50,000 Units total) by mouth every 7 (seven) days.        Birth History: non-contributory  Developmental History: non-contributory  Past Surgical History: Past Surgical History:  Procedure Laterality Date   BREAST BIOPSY Left    CERVICAL CONIZATION W/BX N/A 02/17/2021   Procedure: Laser CONIZATION of the CERVIX;  Surgeon: [name], MD;  Location: [facility];  Service: Gynecology;  Laterality: N/A;   WISDOM TOOTH EXTRACTION       Family History: Family History  Problem Relation Age of Onset   Mental illness Mother    Schizophrenia Mother    Hypertension Maternal Grandmother    Heart disease Maternal Grandfather    Heart attack Maternal Grandfather    Thyroid disease Paternal Grandmother    Stroke Paternal Grandmother     Cancer Paternal Grandfather        colon    Mental illness Maternal Aunt    Stroke Other    Diabetes Other    Inflammatory bowel disease Neg Hx    Celiac disease Neg Hx      Social History: Geriann lives at home with her family. She lives in an apartment with wood throughout the home. The apartment is around 33 years old. They have electric heating and central cooling. There are no animals inside or outside of the home. There are no dust mite coverings on the bedding. There is no fume, chemical, or dust exposure. She quit around August 2024. She had been cutting down for a period of time after that. She does not have a HEPA filter in the home. [place]    Review of systems otherwise negative other than that mentioned in the HPI.    Objective:   Blood pressure 120/74, pulse 90, temperature 98.3 F (36.8 C), resp. rate 20, height 5\' 4"  (1.626 m), weight 182 lb 2 oz (82.6 kg), SpO2 97%. Body mass index is 31.26 kg/m.     Physical Exam Vitals reviewed.  Constitutional:      Appearance: She is well-developed.  HENT:     Head: Normocephalic and atraumatic.     Right Ear: Tympanic membrane, ear canal and external ear normal. No drainage, swelling or tenderness. Tympanic membrane is not injected, scarred, erythematous, retracted or bulging.     Left Ear: Tympanic membrane, ear canal and external ear normal. No drainage, swelling or tenderness. Tympanic membrane is not injected, scarred, erythematous, retracted or bulging.     Nose: No nasal deformity, septal deviation, mucosal edema or rhinorrhea.     Right Turbinates: Enlarged, swollen and pale.     Left Turbinates: Enlarged, swollen and pale.     Right Sinus: No maxillary sinus tenderness or frontal sinus tenderness.     Left Sinus: No maxillary sinus tenderness or frontal sinus tenderness.     Mouth/Throat:     Mouth: Mucous membranes are not pale and not dry.     Pharynx: Uvula  midline.  Eyes:     General:        Right eye: No discharge.        Left eye: No discharge.     Conjunctiva/sclera: Conjunctivae normal.     Right eye: Right conjunctiva is not injected. No chemosis.    Left eye: Left conjunctiva is not injected. No chemosis.    Pupils: Pupils are equal, round, and reactive to light.  Cardiovascular:     Rate and  Rhythm: Normal rate and regular rhythm.     Heart sounds: Normal heart sounds.  Pulmonary:     Effort: Pulmonary effort is normal. No tachypnea, accessory muscle usage or respiratory distress.     Breath sounds: Normal breath sounds. No wheezing, rhonchi or rales.  Chest:     Chest wall: No tenderness.  Abdominal:     Tenderness: There is no abdominal tenderness. There is no guarding or rebound.  Lymphadenopathy:     Head:     Right side of head: No submandibular, tonsillar or occipital adenopathy.     Left side of head: No submandibular, tonsillar or occipital adenopathy.     Cervical: No cervical adenopathy.  Skin:    Coloration: Skin is not pale.     Findings: No abrasion, erythema, petechiae or rash. Rash is not papular, urticarial or vesicular.  Neurological:     Mental Status: She is alert.  Psychiatric:        Behavior: Behavior is cooperative.      Diagnostic studies:    Spirometry: results normal (FEV1: 2.77/102%, FVC: 3.62/112%, FEV1/FVC: 77%).    Spirometry consistent with normal pattern.   Allergy Studies: none      Malachi Bonds, MD [facility]

## 2023-04-22 IMAGING — MG MM BREAST LOCALIZATION CLIP
4 series · 4 of 12 positions shown · non-contrast
Comparison: Previous exam(s).

CLINICAL DATA: Post biopsy mammogram of the left breast for clip
placement.

EXAM:
3D DIAGNOSTIC LEFT MAMMOGRAM POST ULTRASOUND BIOPSY

[L ML synth-2D]
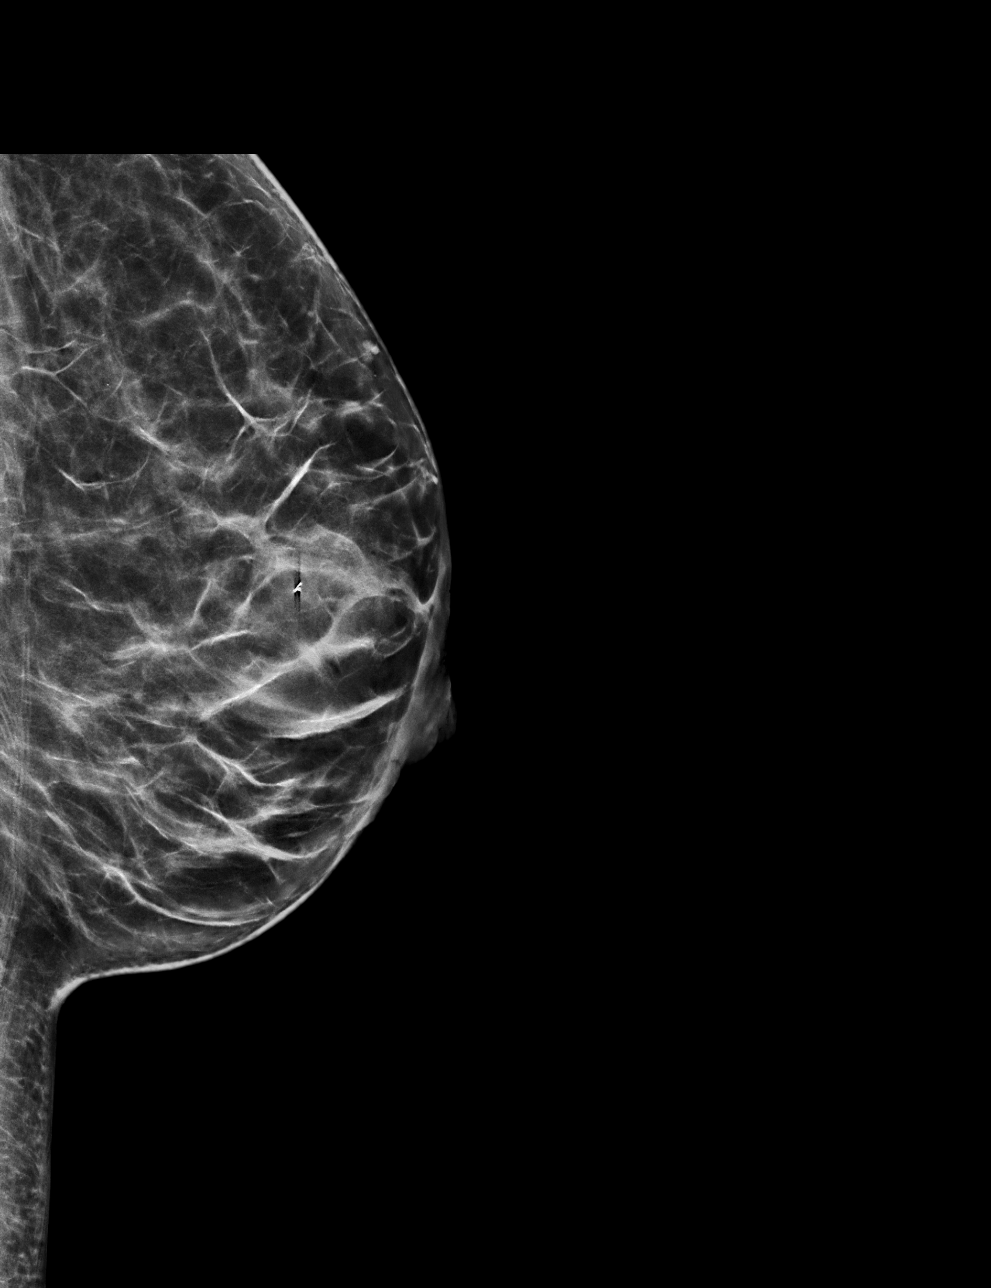

[L CC synth-2D]
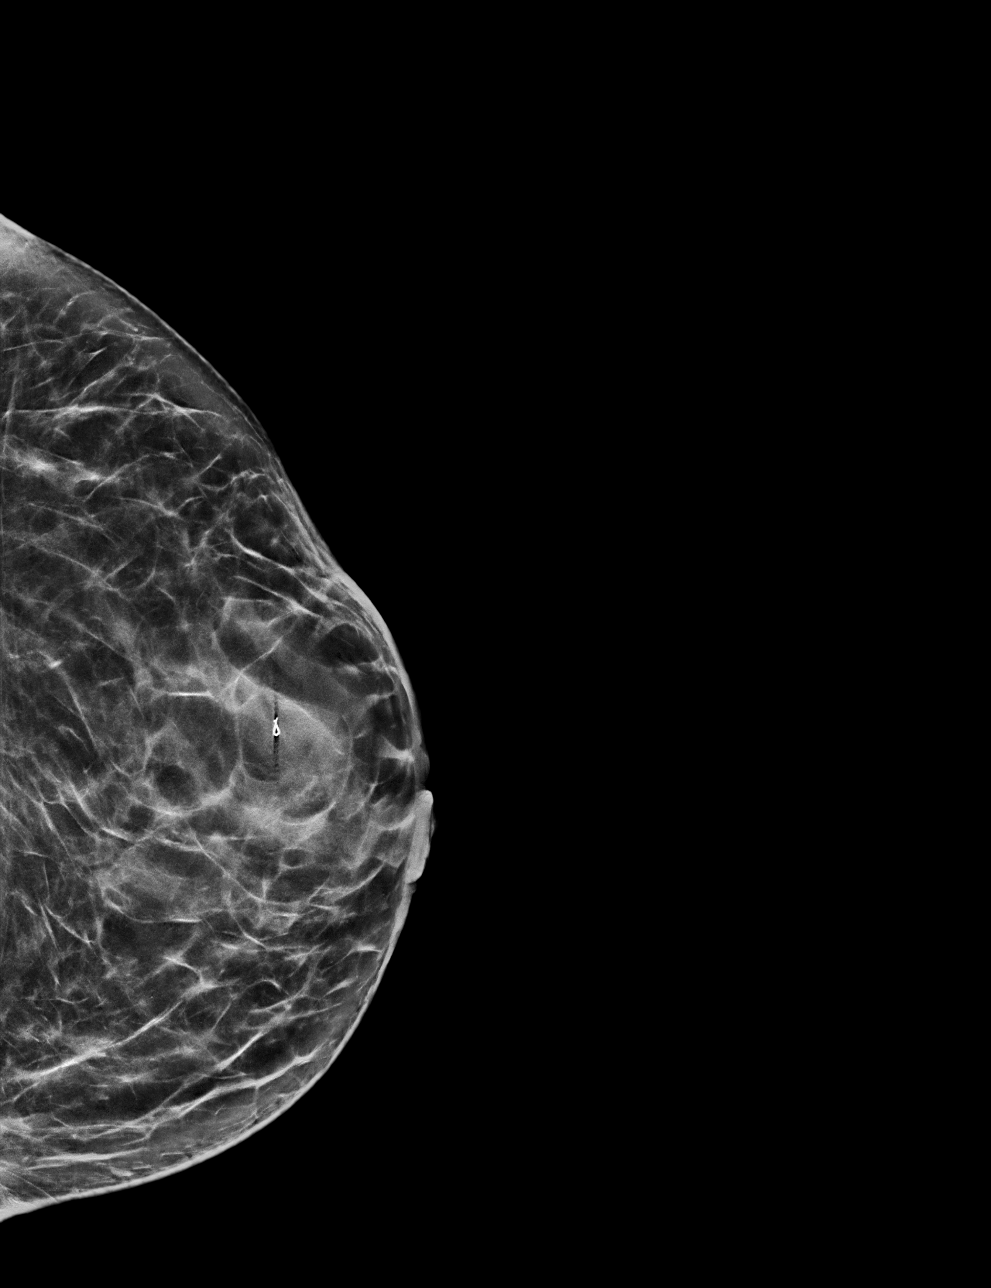

[L CC tomo · tomo slice 34/67.0]
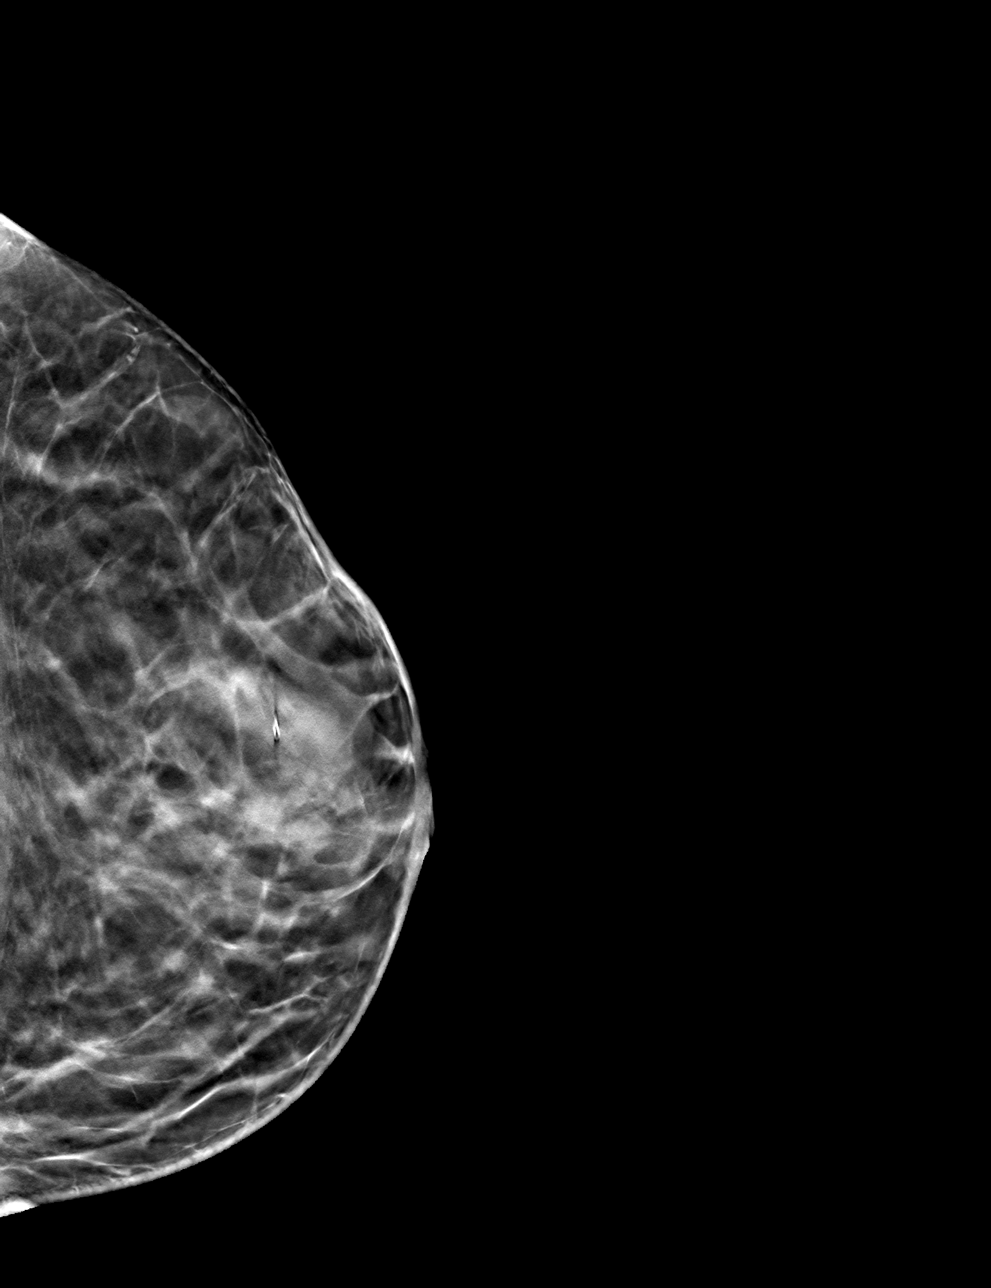

[L ML tomo · tomo slice 33/66.0]
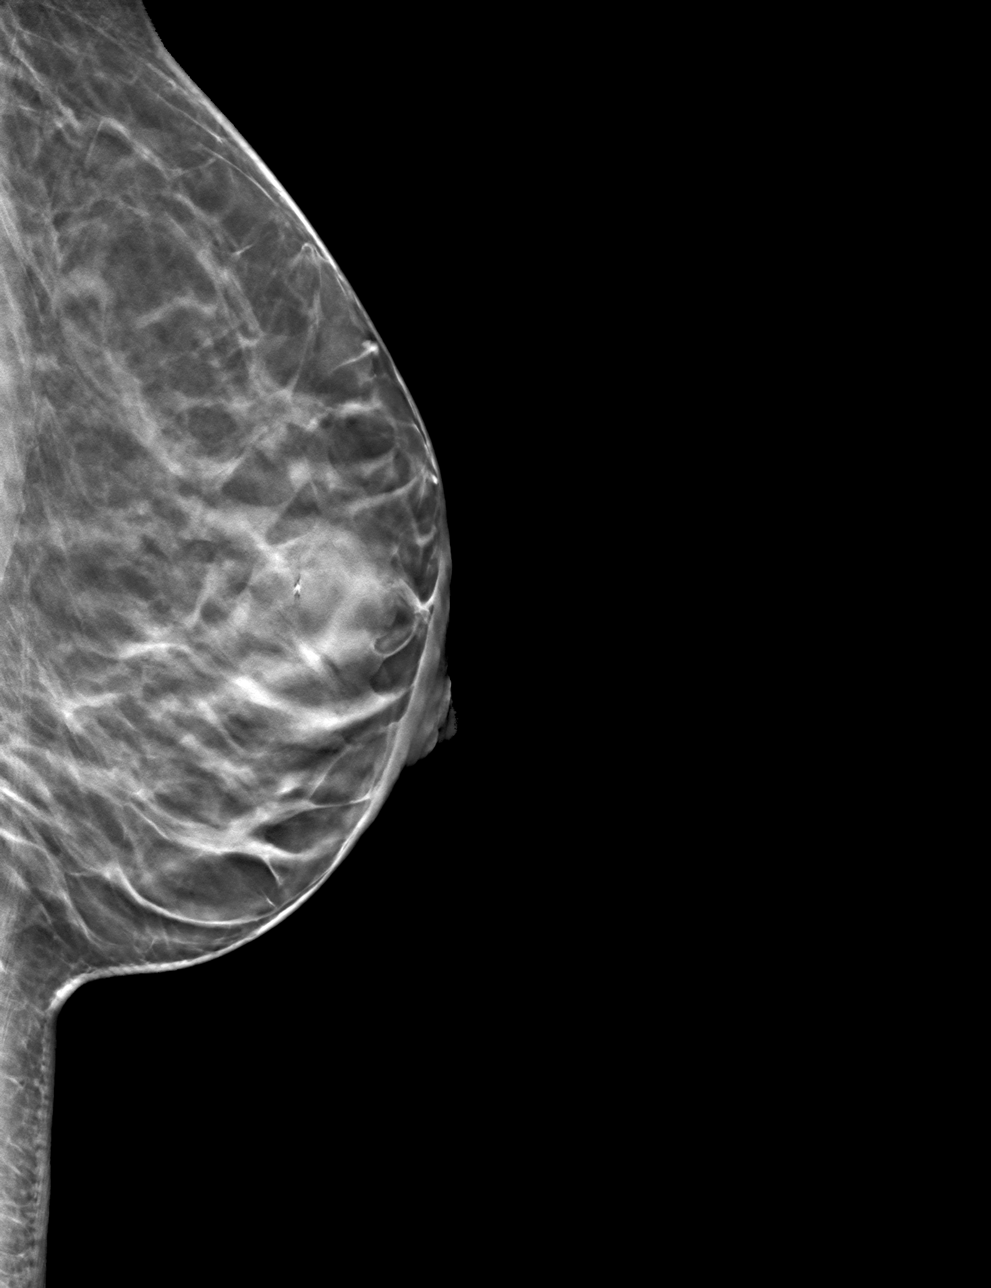

[4 of 12 positions shown; findings below may reference images not displayed]

FINDINGS: 3D Mammographic images were obtained following ultrasound guided
biopsy of a dilated duct in the retroareolar left breast at 3
o'clock. The biopsy marking clip is in expected position at the site
of biopsy.
IMPRESSION: Appropriate positioning of the ribbon shaped biopsy marking clip at
the site of biopsy in the lateral retroareolar left breast.

Final Assessment: Post Procedure Mammograms for Marker Placement

## 2023-04-26 ENCOUNTER — Other Ambulatory Visit: Payer: Self-pay | Admitting: *Deleted

## 2023-04-26 ENCOUNTER — Encounter: Payer: Self-pay | Admitting: *Deleted

## 2023-04-26 ENCOUNTER — Telehealth: Payer: Self-pay | Admitting: *Deleted

## 2023-04-26 DIAGNOSIS — R197 Diarrhea, unspecified: Secondary | ICD-10-CM

## 2023-04-26 DIAGNOSIS — K625 Hemorrhage of anus and rectum: Secondary | ICD-10-CM

## 2023-04-26 MED ORDER — PEG 3350-KCL-NA BICARB-NACL 420 G PO SOLR: 4000.0000 mL | Freq: Once | ORAL | 0 refills | Status: AC

## 2023-04-26 NOTE — Telephone Encounter (Signed)
LMOVM to call back to schedule TCS with Dr. Marletta Lor, ASA 2, needs preg test prior

## 2023-05-02 ENCOUNTER — Ambulatory Visit: Payer: Medicaid Other | Admitting: Family Medicine

## 2023-05-02 ENCOUNTER — Encounter: Payer: Self-pay | Admitting: Family Medicine

## 2023-05-02 VITALS — BP 130/82 | HR 81 | Ht 64.0 in | Wt 177.1 lb

## 2023-05-02 DIAGNOSIS — G43009 Migraine without aura, not intractable, without status migrainosus: Secondary | ICD-10-CM | POA: Diagnosis not present

## 2023-05-02 DIAGNOSIS — I10 Essential (primary) hypertension: Secondary | ICD-10-CM | POA: Diagnosis not present

## 2023-05-02 MED ORDER — SUMATRIPTAN SUCCINATE 50 MG PO TABS: 50.0000 mg | ORAL_TABLET | ORAL | 2 refills | Status: DC | PRN

## 2023-05-02 NOTE — Assessment & Plan Note (Signed)
Controlled Asymptomatic in the clinic Encouraged to continue take amlodipine 5 mg daily Low-sodium diet with increase physical activity encouraged BP Readings from Last 3 Encounters:  05/02/23 130/82  04/21/23 120/74  04/11/23 132/84

## 2023-05-02 NOTE — Patient Instructions (Addendum)
I appreciate the opportunity to provide care to you today!    Follow up:  4 months   MIGRAINE PREVENTION  LIFESTYLE CHANGES -Stop or avoid smoking -Decrease or avoid caffeine / alcohol -Eat and sleep on a regular schedule -Exercise several times per week Supplements to prevent Migraines Magnesium oxide 400mg  daily  or Riboflavoin (vit B2) 400 mg daily MIGRAINE RESCUE  -  Denies hx of MI ans TIA - Take sumatriptan's  25 mg at onset of migraine with a glass of liquid; Wait at least 2 hours between doses (max 200 mg per 24-hour period)  Non-Medication Approaches -Cold Compress or Ice Pack: Applying a cold compress to the forehead or back of the neck may help reduce pain or constrict blood vessels, potentially easing migraine pain. -Rest in a Dark, Quiet Room: Migraines often cause sensitivity to light and sound. Resting in a calm, dimly lit environment can help reduce the severity of symptoms. -Hydration: Dehydration is a common migraine trigger. Drinking water or electrolyte-balanced fluids can help if dehydration is contributing to the attack. -Caffeine: In small doses, caffeine may help relieve migraine pain and is often included in combination with medications (e.g., acetaminophen + caffeine). However, excessive caffeine consumption can be a trigger for some people.   When to Seek Emergency Care -If you experience the following, seek immediate medical help:A severe or unusual headache that is different from your usual migraines. Neurological symptoms such as weakness, numbness, difficulty speaking, or visual disturbances. A headache that is associated with a high fever, stiff neck, or rash (which could indicate an infection or other serious condition).   Please continue to a heart-healthy diet and increase your physical activities. Try to exercise for at least five days a week.    It was a pleasure to see you and I look forward to continuing to work together on your health and  well-being. Please do not hesitate to call the office if you need care or have questions about your care.  In case of emergency, please visit the Emergency Department for urgent care, or contact our clinic at [phone] to schedule an appointment. We're here to help you!   Have a wonderful day and week. With Gratitude, Gilmore Laroche MSN, FNP-BC

## 2023-05-02 NOTE — Assessment & Plan Note (Signed)
The patient reports having migraine episodes at least twice a week, usually lasting for 2 days, with symptoms of light sensitivity and noise sensitivity. Her last migraine episodes were on April 26, 2023, and April 27, 2023. She has been treating them with over-the-counter ibuprofen and Goody powders with minimal relief. She reports no history of MI (myocardial infarction) or stroke. We will initiate abortive treatment with sumatriptan 50 mg to take every 2 hours. The patient is advised that the maximum she can take in 24 hours is 200 mg/day. Nonpharmacological interventions were reviewed, including applying cool compresses to the forehead and back of the neck to decrease pain and ease the migraine, staying well-hydrated, and resting in a dark, quiet room. The patient verbalized understanding and is aware of the plan of care. The patient is also encouraged to seek urgent care if her headache is accompanied by fever, neck stiffness, weakness, numbness, or difficulty speaking.
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
## 2023-05-05 ENCOUNTER — Ambulatory Visit: Payer: Medicaid Other | Admitting: Allergy & Immunology

## 2023-05-05 ENCOUNTER — Encounter: Payer: Self-pay | Admitting: Allergy & Immunology

## 2023-05-05 DIAGNOSIS — J3089 Other allergic rhinitis: Secondary | ICD-10-CM

## 2023-05-05 DIAGNOSIS — J302 Other seasonal allergic rhinitis: Secondary | ICD-10-CM | POA: Diagnosis not present

## 2023-05-05 DIAGNOSIS — R053 Chronic cough: Secondary | ICD-10-CM

## 2023-05-05 MED ORDER — AZELASTINE HCL 0.1 % NA SOLN: 1.0000 | Freq: Two times a day (BID) | NASAL | 5 refills | Status: DC

## 2023-05-05 MED ORDER — CETIRIZINE HCL 10 MG PO TABS: 10.0000 mg | ORAL_TABLET | Freq: Every day | ORAL | 1 refills | Status: DC

## 2023-05-05 MED ORDER — FLUTICASONE PROPIONATE 50 MCG/ACT NA SUSP: 1.0000 | Freq: Every day | NASAL | 5 refills | Status: DC

## 2023-05-05 NOTE — Patient Instructions (Addendum)
1. Chronic cough - Lung testing not done today. - We will continue with the Advair for now.  - Daily controller medication(s): Singulair 10mg  daily and Advair 250/106mcg one puff twice daily - Prior to physical activity: albuterol 2 puffs 10-15 minutes before physical activity. - Rescue medications: albuterol 4 puffs every 4-6 hours as needed - Asthma control goals:  * Full participation in all desired activities (may need albuterol before activity) * Albuterol use two time or less a week on average (not counting use with activity) * Cough interfering with sleep two time or less a month * Oral steroids no more than once a year * No hospitalizations  2. Chronic rhinitis - Testing today showed: grasses, ragweed, trees, indoor molds, outdoor molds, dust mites, cat, dog, and cockroach - Copy of test results provided.  - Avoidance measures provided. - Continue taking: Zyrtec (cetirizine) 10mg  tablet once daily, Singulair (montelukast) 10mg  daily - Start taking: Flonase (fluticasone) one spray per nostril daily (AIM FOR EAR ON EACH SIDE) and Astelin (azelastine) 2 sprays per nostril 1-2 times daily as needed - You can use an extra dose of the antihistamine, if needed, for breakthrough symptoms.  - Consider nasal saline rinses 1-2 times daily to remove allergens from the nasal cavities as well as help with mucous clearance (this is especially helpful to do before the nasal sprays are given) - Consider allergy shots as a means of long-term control. - Allergy shots "re-train" and "reset" the immune system to ignore environmental allergens and decrease the resulting immune response to those allergens (sneezing, itchy watery eyes, runny nose, nasal congestion, etc).    - Allergy shots improve symptoms in 75-85% of patients.  - We can discuss more at the next appointment if the medications are not working for you.  3. Return in about 2 months (around 07/06/2023). You can have the follow up appointment  with Dr. Dellis Anes or a Nurse Practicioner (our Nurse Practitioners are excellent and always have Physician oversight!).    Please inform us of any Emergency Department visits, hospitalizations, or changes in symptoms. Call us before going to the ED for breathing or allergy symptoms since we might be able to fit you in for a sick visit. Feel free to contact us anytime with any questions, problems, or concerns.  It was a pleasure to see you again today!  Websites that have reliable patient information: 1. American Academy of Asthma, Allergy, and Immunology: www.aaaai.org 2. Food Allergy Research and Education (FARE): foodallergy.org 3. Mothers of Asthmatics: http://www.asthmacommunitynetwork.org 4. American College of Allergy, Asthma, and Immunology: www.acaai.org      "Like" [place] on Facebook and Instagram for our latest updates!      A healthy democracy works best when [employer] participate! Make sure you are registered to vote! If you have moved or changed any of your contact information, you will need to get this updated before voting! Scan the QR codes below to learn more!      Airborne Adult Perc - 05/05/23 1300     Time Antigen Placed 1340    Allergen Manufacturer Waynette Buttery    Location Back    Number of Test 55    Panel 1 Select    1. Control-Buffer 50% Glycerol Negative    2. Control-Histamine 2+    3. Bahia Negative    4. [place] Negative    5. Johnson Negative    6. [place] Blue Negative    7. Meadow Fescue Negative    8. Perennial  Rye Negative    9. Timothy 2+    10. Ragweed Mix 2+    11. Cocklebur Negative    12. Plantain,  English Negative    13. Baccharis Negative    14. Dog Fennel Negative    15. Russian Thistle Negative    16. Lamb's Quarters Negative    17. Sheep Sorrell Negative    18. Rough Pigweed Negative    19. Marsh Elder, Rough Negative    20. Mugwort, Common Negative    21. Box, Elder 2+    22. Cedar, red 2+    23. Sweet Gum 2+    24. Pecan  Pollen Negative    25. Pine Mix Negative    26. Walnut, Black Pollen 3+    27. Red Mulberry 2+    28. Ash Mix Negative    29. Birch Mix Negative    30. Beech American Negative    31. Cottonwood, [place] Negative    32. Hickory, White 3+    33. Maple Mix Negative    34. Oak, [place] Mix Negative    35. Sycamore Eastern Negative    36. Alternaria Alternata Negative    37. Cladosporium Herbarum Negative    38. Aspergillus Mix Negative    39. Penicillium Mix 2+    40. Bipolaris Sorokiniana (Helminthosporium) 2+    41. Drechslera Spicifera (Curvularia) Negative    42. Mucor Plumbeus Negative    43. Fusarium Moniliforme Negative    44. Aureobasidium Pullulans (pullulara) Negative    45. Rhizopus Oryzae Negative    46. Botrytis Cinera Negative    47. Epicoccum Nigrum Negative    48. Phoma Betae Negative    49. Dust Mite Mix 4+    50. Cat Hair 10,000 BAU/ml 3+    51.  Dog Epithelia Negative    52. Mixed Feathers Negative    53. Horse Epithelia Negative    54. Cockroach, German Negative    55. Tobacco Leaf Negative             Intradermal - 05/05/23 1409     Time Antigen Placed 1415    Allergen Manufacturer Waynette Buttery    Location Arm    Number of Test 9    Control Negative    Bahia Negative    French [place] Territ[place] Negative    Johnson Negative    Weed Mix Negative    Mold 1 Negative    Mold 4 Negative    Dog 3+    Cockroach 3+             Reducing Pollen Exposure  The American Academy of Allergy, Asthma and Immunology suggests the following steps to reduce your exposure to pollen during allergy seasons.    Do not hang sheets or clothing out to dry; pollen may collect on these items. Do not mow lawns or spend time around freshly cut grass; mowing stirs up pollen. Keep windows closed at night.  Keep car windows closed while driving. Minimize morning activities outdoors, a time when pollen counts are usually at their highest. Stay indoors as much as possible when pollen counts or  humidity is high and on windy days when pollen tends to remain in the air longer. Use air conditioning when possible.  Many air conditioners have filters that trap the pollen spores. Use a HEPA room air filter to remove pollen form the indoor air you breathe.  Control of Mold Allergen   Mold and fungi can grow on a variety of  surfaces provided certain temperature and moisture conditions exist.  Outdoor molds grow on plants, decaying vegetation and soil.  The major outdoor mold, Alternaria and Cladosporium, are found in very high numbers during hot and dry conditions.  Generally, a late Summer - Fall peak is seen for common outdoor fungal spores.  Rain will temporarily lower outdoor mold spore count, but counts rise rapidly when the rainy period ends.  The most important indoor molds are Aspergillus and Penicillium.  Dark, humid and poorly ventilated basements are ideal sites for mold growth.  The next most common sites of mold growth are the bathroom and the kitchen.  Outdoor (Seasonal) Mold Control   Use air conditioning and keep windows closed Avoid exposure to decaying vegetation. Avoid leaf raking. Avoid grain handling. Consider wearing a face mask if working in moldy areas.    Indoor (Perennial) Mold Control    Maintain humidity below 50%. Clean washable surfaces with 5% bleach solution. Remove sources e.g. contaminated carpets.    Control of Dust Mite Allergen    Dust mites play a major role in allergic asthma and rhinitis.  They occur in environments with high humidity wherever human skin is found.  Dust mites absorb humidity from the atmosphere (ie, they do not drink) and feed on organic matter (including shed human and animal skin).  Dust mites are a microscopic type of insect that you cannot see with the naked eye.  High levels of dust mites have been detected from mattresses, pillows, carpets, upholstered furniture, bed covers, clothes, soft toys and any woven material.  The  principal allergen of the dust mite is found in its feces.  A gram of dust may contain 1,000 mites and 250,000 fecal particles.  Mite antigen is easily measured in the air during house cleaning activities.  Dust mites do not bite and do not cause harm to humans, other than by triggering allergies/asthma.    Ways to decrease your exposure to dust mites in your home:  Encase mattresses, box springs and pillows with a mite-impermeable barrier or cover   Wash sheets, blankets and drapes weekly in hot water (130 F) with detergent and dry them in a dryer on the hot setting.  Have the room cleaned frequently with a vacuum cleaner and a damp dust-mop.  For carpeting or rugs, vacuuming with a vacuum cleaner equipped with a high-efficiency particulate air (HEPA) filter.  The dust mite allergic individual should not be in a room which is being cleaned and should wait 1 hour after cleaning before going into the room. Do not sleep on upholstered furniture (eg, couches).   If possible removing carpeting, upholstered furniture and drapery from the home is ideal.  Horizontal blinds should be eliminated in the rooms where the person spends the most time (bedroom, study, television room).  Washable vinyl, roller-type shades are optimal. Remove all non-washable stuffed toys from the bedroom.  Wash stuffed toys weekly like sheets and blankets above.   Reduce indoor humidity to less than 50%.  Inexpensive humidity monitors can be purchased at most hardware stores.  Do not use a humidifier as can make the problem worse and are not recommended.  Control of Dog or Cat Allergen  Avoidance is the best way to manage a dog or cat allergy. If you have a dog or cat and are allergic to dog or cats, consider removing the dog or cat from the home. If you have a dog or cat but don't want to find it a  new home, or if your family wants a pet even though someone in the household is allergic, here are some strategies that may help keep  symptoms at bay:  Keep the pet out of your bedroom and restrict it to only a few rooms. Be advised that keeping the dog or cat in only one room will not limit the allergens to that room. Don't pet, hug or kiss the dog or cat; if you do, wash your hands with soap and water. High-efficiency particulate air (HEPA) cleaners run continuously in a bedroom or living room can reduce allergen levels over time. Regular use of a high-efficiency vacuum cleaner or a central vacuum can reduce allergen levels. Giving your dog or cat a bath at least once a week can reduce airborne allergen.  Control of Cockroach Allergen  Cockroach allergen has been identified as an important cause of acute attacks of asthma, especially in urban settings.  There are fifty-five species of cockroach that exist in the [place], however only three, the [place], [place] species produce allergen that can affect patients with Asthma.  Allergens can be obtained from fecal particles, egg casings and secretions from cockroaches.    Remove food sources. Reduce access to water. Seal access and entry points. Spray runways with 0.5-1% Diazinon or Chlorpyrifos Blow boric acid power under stoves and refrigerator. Place bait stations (hydramethylnon) at feeding sites.  Allergy Shots  Allergies are the result of a chain reaction that starts in the immune system. Your immune system controls how your body defends itself. For instance, if you have an allergy to pollen, your immune system identifies pollen as an invader or allergen. Your immune system overreacts by producing antibodies called Immunoglobulin E (IgE). These antibodies travel to cells that release chemicals, causing an allergic reaction.  The concept behind allergy immunotherapy, whether it is received in the form of shots or tablets, is that the immune system can be desensitized to specific allergens that trigger allergy symptoms. Although it requires time and  patience, the payback can be long-term relief. Allergy injections contain a dilute solution of those substances that you are allergic to based upon your skin testing and allergy history.   How Do Allergy Shots Work?  Allergy shots work much like a vaccine. Your body responds to injected amounts of a particular allergen given in increasing doses, eventually developing a resistance and tolerance to it. Allergy shots can lead to decreased, minimal or no allergy symptoms.  There generally are two phases: build-up and maintenance. Build-up often ranges from three to six months and involves receiving injections with increasing amounts of the allergens. The shots are typically given once or twice a week, though more rapid build-up schedules are sometimes used.  The maintenance phase begins when the most effective dose is reached. This dose is different for each person, depending on how allergic you are and your response to the build-up injections. Once the maintenance dose is reached, there are longer periods between injections, typically two to four weeks.  Occasionally doctors give cortisone-type shots that can temporarily reduce allergy symptoms. These types of shots are different and should not be confused with allergy immunotherapy shots.  Who Can Be Treated with Allergy Shots?  Allergy shots may be a good treatment approach for people with allergic rhinitis (hay fever), allergic asthma, conjunctivitis (eye allergy) or stinging insect allergy.   Before deciding to begin allergy shots, you should consider:   The length of allergy season and the severity of your  symptoms  Whether medications and/or changes to your environment can control your symptoms  Your desire to avoid long-term medication use  Time: allergy immunotherapy requires a major time commitment  Cost: may vary depending on your insurance coverage  Allergy shots for children age 43 and older are effective and often well tolerated.  They might prevent the onset of new allergen sensitivities or the progression to asthma.  Allergy shots are not started on patients who are pregnant but can be continued on patients who become pregnant while receiving them. In some patients with other medical conditions or who take certain common medications, allergy shots may be of risk. It is important to mention other medications you talk to your allergist.   What are the two types of build-ups offered:   RUSH or Rapid Desensitization -- one day of injections lasting from 8:30-4:30pm, injections every 1 hour.  Approximately half of the build-up process is completed in that one day.  The following week, normal build-up is resumed, and this entails ~16 visits either weekly or twice weekly, until reaching your "maintenance dose" which is continued weekly until eventually getting spaced out to every month for a duration of 3 to 5 years. The regular build-up appointments are nurse visits where the injections are administered, followed by required monitoring for 30 minutes.    Traditional build-up -- weekly visits for 6 -12 months until reaching "maintenance dose", then continue weekly until eventually spacing out to every 4 weeks as above. At these appointments, the injections are administered, followed by required monitoring for 30 minutes.     Either way is acceptable, and both are equally effective. With the rush protocol, the advantage is that less time is spent here for injections overall AND you would also reach maintenance dosing faster (which is when the clinical benefit starts to become more apparent). Not everyone is a candidate for rapid desensitization.   IF we proceed with the RUSH protocol, there are premedications which must be taken the day before and the day after the rush only (this includes antihistamines, steroids, and Singulair).  After the rush day, no prednisone or Singulair is required, and we just recommend antihistamines taken  on your injection day.  What Is An Estimate of the Costs?  If you are interested in starting allergy injections, please check with your insurance company about your coverage for both allergy vial sets and allergy injections.  Please do so prior to making the appointment to start injections.  The following are CPT codes to give to your insurance company. These are the amounts we BILL to the insurance company, but the amount YOU WILL PAY and WE RECEIVE IS SUBSTANTIALLY LESS and depends on the contracts we have with different insurance companies.   Amount Billed to Insurance One allergy vial set  CPT 95165   $ 1200     Two allergy vial set  CPT 95165   $ 2400     Three allergy vial set  CPT 95165   $ 3600     One injection   CPT 95115   $ 35  Two injections   CPT 95117   $ 40 RUSH (Rapid Desensitization) CPT 95180 x 8 hours $500/hour  Regarding the allergy injections, your co-pay may or may not apply with each injection, so please confirm this with your insurance company. When you start allergy injections, 1 or 2 sets of vials are made based on your allergies.  Not all patients can be on one set  of vials. A set of vials lasts 6 months to a year depending on how quickly you can proceed with your build-up of your allergy injections. Vials are personalized for each patient depending on their specific allergens.  How often are allergy injection given during the build-up period?   Injections are given at least weekly during the build-up period until your maintenance dose is achieved. Per the doctor's discretion, you may have the option of getting allergy injections two times per week during the build-up period. However, there must be at least 48 hours between injections. The build-up period is usually completed within 6-12 months depending on your ability to schedule injections and for adjustments for reactions. When maintenance dose is reached, your injection schedule is gradually changed to every two  weeks and later to every three weeks. Injections will then continue every 4 weeks. Usually, injections are continued for a total of 3-5 years.   When Will I Feel Better?  Some may experience decreased allergy symptoms during the build-up phase. For others, it may take as long as 12 months on the maintenance dose. If there is no improvement after a year of maintenance, your allergist will discuss other treatment options with you.  If you aren't responding to allergy shots, it may be because there is not enough dose of the allergen in your vaccine or there are missing allergens that were not identified during your allergy testing. Other reasons could be that there are high levels of the allergen in your environment or major exposure to non-allergic triggers like tobacco smoke.  What Is the Length of Treatment?  Once the maintenance dose is reached, allergy shots are generally continued for three to five years. The decision to stop should be discussed with your allergist at that time. Some people may experience a permanent reduction of allergy symptoms. Others may relapse and a longer course of allergy shots can be considered.  What Are the Possible Reactions?  The two types of adverse reactions that can occur with allergy shots are local and systemic. Common local reactions include very mild redness and swelling at the injection site, which can happen immediately or several hours after. Report a delayed reaction from your last injection. These include arm swelling or runny nose, watery eyes or cough that occurs within 12-24 hours after injection. A systemic reaction, which is less common, affects the entire body or a particular body system. They are usually mild and typically respond quickly to medications. Signs include increased allergy symptoms such as sneezing, a stuffy nose or hives.   Rarely, a serious systemic reaction called anaphylaxis can develop. Symptoms include swelling in the throat,  wheezing, a feeling of tightness in the chest, nausea or dizziness. Most serious systemic reactions develop within 30 minutes of allergy shots. This is why it is strongly recommended you wait in your doctor's office for 30 minutes after your injections. Your allergist is trained to watch for reactions, and his or her staff is trained and equipped with the proper medications to identify and treat them.   Report to the nurse immediately if you experience any of the following symptoms: swelling, itching or redness of the skin, hives, watery eyes/nose, breathing difficulty, excessive sneezing, coughing, stomach pain, diarrhea, or light headedness. These symptoms may occur within 15-20 minutes after injection and may require medication.   Who Should Administer Allergy Shots?  The preferred location for receiving shots is your prescribing allergist's office. Injections can sometimes be given at another facility where the  physician and staff are trained to recognize and treat reactions, and have received instructions by your prescribing allergist.  What if I am late for an injection?   Injection dose will be adjusted depending upon how many days or weeks you are late for your injection.   What if I am sick?   Please report any illness to the nurse before receiving injections. She may adjust your dose or postpone injections depending on your symptoms. If you have fever, flu, sinus infection or chest congestion it is best to postpone allergy injections until you are better. Never get an allergy injection if your asthma is causing you problems. If your symptoms persist, seek out medical care to get your health problem under control.  What If I am or Become Pregnant:  Women that become pregnant should schedule an appointment with [facility] before receiving any further allergy injections.

## 2023-05-05 NOTE — Progress Notes (Signed)
FOLLOW UP  Date of Service/Encounter:  05/05/23   Assessment:   Chronic cough   Perennial and seasonal allergic rhinitis (grasses, ragweed, trees, indoor molds, outdoor molds, dust mites, cat, dog, and cockroach)  Plan/Recommendations:   1. Chronic cough - Lung testing not done today. - We will continue with the Advair for now.  - Daily controller medication(s): Singulair 10mg  daily and Advair 250/15mcg one puff twice daily - Prior to physical activity: albuterol 2 puffs 10-15 minutes before physical activity. - Rescue medications: albuterol 4 puffs every 4-6 hours as needed - Asthma control goals:  * Full participation in all desired activities (may need albuterol before activity) * Albuterol use two time or less a week on average (not counting use with activity) * Cough interfering with sleep two time or less a month * Oral steroids no more than once a year * No hospitalizations  2. Chronic rhinitis - Testing today showed: grasses, ragweed, trees, indoor molds, outdoor molds, dust mites, cat, dog, and cockroach - Copy of test results provided.  - Avoidance measures provided. - Continue taking: Zyrtec (cetirizine) 10mg  tablet once daily, Singulair (montelukast) 10mg  daily - Start taking: Flonase (fluticasone) one spray per nostril daily (AIM FOR EAR ON EACH SIDE) and Astelin (azelastine) 2 sprays per nostril 1-2 times daily as needed - You can use an extra dose of the antihistamine, if needed, for breakthrough symptoms.  - Consider nasal saline rinses 1-2 times daily to remove allergens from the nasal cavities as well as help with mucous clearance (this is especially helpful to do before the nasal sprays are given) - Consider allergy shots as a means of long-term control. - Allergy shots "re-train" and "reset" the immune system to ignore environmental allergens and decrease the resulting immune response to those allergens (sneezing, itchy watery eyes, runny nose, nasal  congestion, etc).    - Allergy shots improve symptoms in 75-85% of patients.  - We can discuss more at the next appointment if the medications are not working for you.  3. Return in about 2 months (around 07/06/2023). You can have the follow up appointment with Dr. Dellis Anes or a Nurse Practicioner (our Nurse Practitioners are excellent and always have Physician oversight!).      Subjective:   Rebecca [name] is a 32 y.o. female presenting today for follow up of No chief complaint on file.   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  Discussed the use of AI scribe software for clinical note transcription with the patient and/or guardian, who gave verbal consent to proceed.  Rebecca [name] is a 32 y.o. female presenting for skin testing. She was last seen on December 6th. We could not do testing because her insurance company does not cover testing on the same day as a New Patient visit. She has been off of all antihistamines 3 days in anticipation of the testing.   At that time, we decided to do  allergy testing at this visit. We also added on a controller medication for her asthma.   Otherwise, there have been no changes to her past medical history, surgical history, family history, or social history.    Review of systems otherwise negative other than that mentioned in the  HPI.    Objective:   There were no vitals taken for this visit. There is no height or weight on file to calculate BMI.    Physical exam deferred since this was a skin testing appointment only.   Diagnostic studies:   Allergy Studies:     Airborne Adult Perc - 05/05/23 1300     Time Antigen Placed 1340    Allergen Manufacturer Waynette Buttery    Location Back    Number of Test 55    Panel 1 Select    1. Control-Buffer 50% Glycerol Negative    2. Control-Histamine 2+    3. Bahia Negative    4. French [place] Territ[place] Negative    5. Johnson Negative    6. Kentucky Blue Negative    7. Meadow Fescue Negative    8. Perennial Rye Negative    9. Timothy 2+    10. Ragweed Mix 2+    11. Cocklebur Negative    12. Plantain,  English Negative    13. Baccharis Negative    14. Dog Fennel Negative    15. Russian Thistle Negative    16. Lamb's Quarters Negative    17. Sheep Sorrell Negative    18. Rough Pigweed Negative    19. Marsh Elder, Rough Negative    20. Mugwort, Common Negative    21. Box, Elder 2+    22. Cedar, red 2+    23. Sweet Gum 2+    24. Pecan Pollen Negative    25. Pine Mix Negative    26. Walnut, Black Pollen 3+    27. Red Mulberry 2+    28. Ash Mix Negative    29. Birch Mix Negative    30. Beech American Negative    31. Cottonwood, [place] Negative    32. Hickory, White 3+    33. Maple Mix Negative    34. Oak, [place] Mix Negative    35. Sycamore Eastern Negative    36. Alternaria Alternata Negative    37. Cladosporium Herbarum Negative    38. Aspergillus Mix Negative    39. Penicillium Mix 2+    40. Bipolaris Sorokiniana (Helminthosporium) 2+    41. Drechslera Spicifera (Curvularia) Negative    42. Mucor Plumbeus Negative    43. Fusarium Moniliforme Negative    44. Aureobasidium Pullulans (pullulara) Negative    45. Rhizopus Oryzae Negative    46. Botrytis Cinera Negative    47. Epicoccum Nigrum Negative    48. Phoma Betae Negative    49. Dust Mite Mix 4+    50. Cat  Hair 10,000 BAU/ml 3+    51.  Dog Epithelia Negative    52. Mixed Feathers Negative    53. Horse Epithelia Negative    54. Cockroach, German Negative    55. Tobacco Leaf Negative             Intradermal - 05/05/23 1409     Time Antigen Placed 1415    Allergen Manufacturer Waynette Buttery    Location Arm    Number of Test 9    Control Negative    Bahia Negative    [place] Negative    Johnson Negative    Weed Mix Negative  Mold 1 Negative    Mold 4 Negative    Dog 3+    Cockroach 3+             Allergy testing results were read and interpreted by myself, documented by clinical staff.      Malachi Bonds, MD  [facility]

## 2023-05-23 ENCOUNTER — Ambulatory Visit (HOSPITAL_COMMUNITY): Admission: RE | Admit: 2023-05-23 | Payer: Medicaid Other | Source: Home / Self Care | Admitting: Internal Medicine

## 2023-05-23 ENCOUNTER — Encounter (HOSPITAL_COMMUNITY): Admission: RE | Payer: Self-pay | Source: Home / Self Care

## 2023-05-23 ENCOUNTER — Encounter (HOSPITAL_COMMUNITY): Payer: Self-pay | Admitting: Anesthesiology

## 2023-05-23 ENCOUNTER — Telehealth: Payer: Self-pay | Admitting: *Deleted

## 2023-05-23 SURGERY — COLONOSCOPY WITH PROPOFOL
Anesthesia: Monitor Anesthesia Care

## 2023-05-23 NOTE — Telephone Encounter (Signed)
 Lucinda Dell, RN: shontel Stickles did not show up for her procedure

## 2023-05-24 NOTE — Telephone Encounter (Signed)
 Pt states that she doesn't have anyone to help with her kids and doesn't have anyone for transportation. She states the friend she had to help had a death in the family. Pt will call back to reschedule once she gets things figured out.

## 2023-05-24 NOTE — Telephone Encounter (Signed)
 noted

## 2023-05-25 ENCOUNTER — Telehealth (INDEPENDENT_AMBULATORY_CARE_PROVIDER_SITE_OTHER): Payer: Self-pay | Admitting: Internal Medicine

## 2023-05-25 NOTE — Telephone Encounter (Signed)
Please disregard message. Wrong patient.

## 2023-05-25 NOTE — Telephone Encounter (Signed)
 From Anitra Bell,RN Good morning Rebecca [name] was a colon for Dr. Jena Gauss today will have to come back in 6 months for large polyp, he needs to be done in room 3. Dr. Jena Gauss has put a note in but wanted to notify office staff

## 2023-06-04 ENCOUNTER — Other Ambulatory Visit: Payer: Self-pay | Admitting: Family Medicine

## 2023-06-04 DIAGNOSIS — I10 Essential (primary) hypertension: Secondary | ICD-10-CM

## 2023-07-20 NOTE — Patient Instructions (Addendum)
 1. Chronic cough -greater than 50% better Your breathing test looks great today! - Continue current regimen. Make sure and take Singulair (montelukast) every day - Daily controller medication(s): Singulair 10mg  daily and Advair 250/57mcg one puff twice daily - Prior to physical activity: albuterol 2 puffs 10-15 minutes before physical activity. - Rescue medications: albuterol 4 puffs every 4-6 hours as needed - Asthma control goals:  * Full participation in all desired activities (may need albuterol before activity) * Albuterol use two time or less a week on average (not counting use with activity) * Cough interfering with sleep two time or less a month * Oral steroids no more than once a year * No hospitalizations  2. Allergic rhinitis - Testing on 05/05/23 was positive to: grasses, ragweed, trees, indoor molds, outdoor molds, dust mites, cat, dog, and cockroach  - Continue avoidance measures. Stop Flonase (fluticasone) due to nose bleeds - Continue taking: Zyrtec (cetirizine) 10mg  tablet once daily, Singulair (montelukast) 10mg  daily - Continue Astelin (azelastine) 2 sprays per nostril 1-2 times daily as needed - You can use an extra dose of the antihistamine, if needed, for breakthrough symptoms.  - Consider nasal saline rinses 1-2 times daily to remove allergens from the nasal cavities as well as help with mucous clearance (this is especially helpful to do before the nasal sprays are given) - Consider allergy shots as a means of long-term control. - Allergy shots "re-train" and "reset" the immune system to ignore environmental allergens and decrease the resulting immune response to those allergens (sneezing, itchy watery eyes, runny nose, nasal congestion, etc).    - Allergy shots improve symptoms in 75-85% of patients.  - We can discuss more at the next appointment if the medications are not working for you.  3.reflux Start a 30 days trial of pantopazole 40 mg once a day  Start  dietary and lifestyle modifications as below  Lifestyle Changes for Controlling GERD When you have GERD, stomach acid feels as if it's backing up toward your mouth. Whether or not you take medication to control your GERD, your symptoms can often be improved with lifestyle changes.   Raise Your Head Reflux is more likely to strike when you're lying down flat, because stomach fluid can flow backward more easily. Raising the head of your bed 4-6 inches can help. To do this: Slide blocks or books under the legs at the head of your bed. Or, place a wedge under the mattress. Many foam stores can make a suitable wedge for you. The wedge should run from your waist to the top of your head. Don't just prop your head on several pillows. This increases pressure on your stomach. It can make GERD worse.  Watch Your Eating Habits Certain foods may increase the acid in your stomach or relax the lower esophageal sphincter, making GERD more likely. It's best to avoid the following: Coffee, tea, and carbonated drinks (with and without caffeine) Fatty, fried, or spicy food Mint, chocolate, onions, and tomatoes Any other foods that seem to irritate your stomach or cause you pain  Relieve the Pressure Eat smaller meals, even if you have to eat more often. Don't lie down right after you eat. Wait a few hours for your stomach to empty. Avoid tight belts and tight-fitting clothes. Lose excess weight.  Tobacco and Alcohol Avoid smoking tobacco and drinking alcohol. They can make GERD symptoms worse.    Schedule a follow up appointment in 4 weeks or sooner if needed  Reducing Pollen Exposure  The American Academy of Allergy, Asthma and Immunology suggests the following steps to reduce your exposure to pollen during allergy seasons.    Do not hang sheets or clothing out to dry; pollen may collect on these items. Do not mow lawns or spend time around freshly cut grass; mowing stirs up  pollen. Keep windows closed at night.  Keep car windows closed while driving. Minimize morning activities outdoors, a time when pollen counts are usually at their highest. Stay indoors as much as possible when pollen counts or humidity is high and on windy days when pollen tends to remain in the air longer. Use air conditioning when possible.  Many air conditioners have filters that trap the pollen spores. Use a HEPA room air filter to remove pollen form the indoor air you breathe.  Control of Mold Allergen   Mold and fungi can grow on a variety of surfaces provided certain temperature and moisture conditions exist.  Outdoor molds grow on plants, decaying vegetation and soil.  The major outdoor mold, Alternaria and Cladosporium, are found in very high numbers during hot and dry conditions.  Generally, a late Summer - Fall peak is seen for common outdoor fungal spores.  Rain will temporarily lower outdoor mold spore count, but counts rise rapidly when the rainy period ends.  The most important indoor molds are Aspergillus and Penicillium.  Dark, humid and poorly ventilated basements are ideal sites for mold growth.  The next most common sites of mold growth are the bathroom and the kitchen.  Outdoor (Seasonal) Mold Control   Use air conditioning and keep windows closed Avoid exposure to decaying vegetation. Avoid leaf raking. Avoid grain handling. Consider wearing a face mask if working in moldy areas.    Indoor (Perennial) Mold Control    Maintain humidity below 50%. Clean washable surfaces with 5% bleach solution. Remove sources e.g. contaminated carpets.    Control of Dust Mite Allergen    Dust mites play a major role in allergic asthma and rhinitis.  They occur in environments with high humidity wherever human skin is found.  Dust mites absorb humidity from the atmosphere (ie, they do not drink) and feed on organic matter (including shed human and animal skin).  Dust mites are a  microscopic type of insect that you cannot see with the naked eye.  High levels of dust mites have been detected from mattresses, pillows, carpets, upholstered furniture, bed covers, clothes, soft toys and any woven material.  The principal allergen of the dust mite is found in its feces.  A gram of dust may contain 1,000 mites and 250,000 fecal particles.  Mite antigen is easily measured in the air during house cleaning activities.  Dust mites do not bite and do not cause harm to humans, other than by triggering allergies/asthma.    Ways to decrease your exposure to dust mites in your home:  Encase mattresses, box springs and pillows with a mite-impermeable barrier or cover   Wash sheets, blankets and drapes weekly in hot water (130 F) with detergent and dry them in a dryer on the hot setting.  Have the room cleaned frequently with a vacuum cleaner and a damp dust-mop.  For carpeting or rugs, vacuuming with a vacuum cleaner equipped with a high-efficiency particulate air (HEPA) filter.  The dust mite allergic individual should not be in a room which is being cleaned and should wait 1 hour after cleaning before going into the room. Do not sleep  on upholstered furniture (eg, couches).   If possible removing carpeting, upholstered furniture and drapery from the home is ideal.  Horizontal blinds should be eliminated in the rooms where the person spends the most time (bedroom, study, television room).  Washable vinyl, roller-type shades are optimal. Remove all non-washable stuffed toys from the bedroom.  Wash stuffed toys weekly like sheets and blankets above.   Reduce indoor humidity to less than 50%.  Inexpensive humidity monitors can be purchased at most hardware stores.  Do not use a humidifier as can make the problem worse and are not recommended.  Control of Dog or Cat Allergen  Avoidance is the best way to manage a dog or cat allergy. If you have a dog or cat and are allergic to dog or cats,  consider removing the dog or cat from the home. If you have a dog or cat but don't want to find it a new home, or if your family wants a pet even though someone in the household is allergic, here are some strategies that may help keep symptoms at bay:  Keep the pet out of your bedroom and restrict it to only a few rooms. Be advised that keeping the dog or cat in only one room will not limit the allergens to that room. Don't pet, hug or kiss the dog or cat; if you do, wash your hands with soap and water. High-efficiency particulate air (HEPA) cleaners run continuously in a bedroom or living room can reduce allergen levels over time. Regular use of a high-efficiency vacuum cleaner or a central vacuum can reduce allergen levels. Giving your dog or cat a bath at least once a week can reduce airborne allergen.  Control of Cockroach Allergen  Cockroach allergen has been identified as an important cause of acute attacks of asthma, especially in urban settings.  There are fifty-five species of cockroach that exist in the [place], however only three, the [place], [place] species produce allergen that can affect patients with Asthma.  Allergens can be obtained from fecal particles, egg casings and secretions from cockroaches.    Remove food sources. Reduce access to water. Seal access and entry points. Spray runways with 0.5-1% Diazinon or Chlorpyrifos Blow boric acid power under stoves and refrigerator. Place bait stations (hydramethylnon) at feeding sites.  Allergy Shots  Allergies are the result of a chain reaction that starts in the immune system. Your immune system controls how your body defends itself. For instance, if you have an allergy to pollen, your immune system identifies pollen as an invader or allergen. Your immune system overreacts by producing antibodies called Immunoglobulin E (IgE). These antibodies travel to cells that release chemicals, causing an allergic  reaction.  The concept behind allergy immunotherapy, whether it is received in the form of shots or tablets, is that the immune system can be desensitized to specific allergens that trigger allergy symptoms. Although it requires time and patience, the payback can be long-term relief. Allergy injections contain a dilute solution of those substances that you are allergic to based upon your skin testing and allergy history.   How Do Allergy Shots Work?  Allergy shots work much like a vaccine. Your body responds to injected amounts of a particular allergen given in increasing doses, eventually developing a resistance and tolerance to it. Allergy shots can lead to decreased, minimal or no allergy symptoms.  There generally are two phases: build-up and maintenance. Build-up often ranges from three to six months and involves  receiving injections with increasing amounts of the allergens. The shots are typically given once or twice a week, though more rapid build-up schedules are sometimes used.  The maintenance phase begins when the most effective dose is reached. This dose is different for each person, depending on how allergic you are and your response to the build-up injections. Once the maintenance dose is reached, there are longer periods between injections, typically two to four weeks.  Occasionally doctors give cortisone-type shots that can temporarily reduce allergy symptoms. These types of shots are different and should not be confused with allergy immunotherapy shots.  Who Can Be Treated with Allergy Shots?  Allergy shots may be a good treatment approach for people with allergic rhinitis (hay fever), allergic asthma, conjunctivitis (eye allergy) or stinging insect allergy.   Before deciding to begin allergy shots, you should consider:   The length of allergy season and the severity of your symptoms  Whether medications and/or changes to your environment can control your symptoms  Your desire  to avoid long-term medication use  Time: allergy immunotherapy requires a major time commitment  Cost: may vary depending on your insurance coverage  Allergy shots for children age 62 and older are effective and often well tolerated. They might prevent the onset of new allergen sensitivities or the progression to asthma.  Allergy shots are not started on patients who are pregnant but can be continued on patients who become pregnant while receiving them. In some patients with other medical conditions or who take certain common medications, allergy shots may be of risk. It is important to mention other medications you talk to your allergist.   What are the two types of build-ups offered:   RUSH or Rapid Desensitization -- one day of injections lasting from 8:30-4:30pm, injections every 1 hour.  Approximately half of the build-up process is completed in that one day.  The following week, normal build-up is resumed, and this entails ~16 visits either weekly or twice weekly, until reaching your "maintenance dose" which is continued weekly until eventually getting spaced out to every month for a duration of 3 to 5 years. The regular build-up appointments are nurse visits where the injections are administered, followed by required monitoring for 30 minutes.    Traditional build-up -- weekly visits for 6 -12 months until reaching "maintenance dose", then continue weekly until eventually spacing out to every 4 weeks as above. At these appointments, the injections are administered, followed by required monitoring for 30 minutes.     Either way is acceptable, and both are equally effective. With the rush protocol, the advantage is that less time is spent here for injections overall AND you would also reach maintenance dosing faster (which is when the clinical benefit starts to become more apparent). Not everyone is a candidate for rapid desensitization.   IF we proceed with the RUSH protocol, there are  premedications which must be taken the day before and the day after the rush only (this includes antihistamines, steroids, and Singulair).  After the rush day, no prednisone or Singulair is required, and we just recommend antihistamines taken on your injection day.  What Is An Estimate of the Costs?  If you are interested in starting allergy injections, please check with your insurance company about your coverage for both allergy vial sets and allergy injections.  Please do so prior to making the appointment to start injections.  The following are CPT codes to give to your insurance company. These are the amounts we  BILL to the insurance company, but the amount YOU WILL PAY and WE RECEIVE IS SUBSTANTIALLY LESS and depends on the contracts we have with different insurance companies.   Amount Billed to Insurance One allergy vial set  CPT 95165   $ 1200     Two allergy vial set  CPT 95165   $ 2400     Three allergy vial set  CPT 95165   $ 3600     One injection   CPT 95115   $ 35  Two injections   CPT 95117   $ 40 RUSH (Rapid Desensitization) CPT 95180 x 8 hours $500/hour  Regarding the allergy injections, your co-pay may or may not apply with each injection, so please confirm this with your insurance company. When you start allergy injections, 1 or 2 sets of vials are made based on your allergies.  Not all patients can be on one set of vials. A set of vials lasts 6 months to a year depending on how quickly you can proceed with your build-up of your allergy injections. Vials are personalized for each patient depending on their specific allergens.  How often are allergy injection given during the build-up period?   Injections are given at least weekly during the build-up period until your maintenance dose is achieved. Per the doctor's discretion, you may have the option of getting allergy injections two times per week during the build-up period. However, there must be at least 48 hours between  injections. The build-up period is usually completed within 6-12 months depending on your ability to schedule injections and for adjustments for reactions. When maintenance dose is reached, your injection schedule is gradually changed to every two weeks and later to every three weeks. Injections will then continue every 4 weeks. Usually, injections are continued for a total of 3-5 years.   When Will I Feel Better?  Some may experience decreased allergy symptoms during the build-up phase. For others, it may take as long as 12 months on the maintenance dose. If there is no improvement after a year of maintenance, your allergist will discuss other treatment options with you.  If you aren't responding to allergy shots, it may be because there is not enough dose of the allergen in your vaccine or there are missing allergens that were not identified during your allergy testing. Other reasons could be that there are high levels of the allergen in your environment or major exposure to non-allergic triggers like tobacco smoke.  What Is the Length of Treatment?  Once the maintenance dose is reached, allergy shots are generally continued for three to five years. The decision to stop should be discussed with your allergist at that time. Some people may experience a permanent reduction of allergy symptoms. Others may relapse and a longer course of allergy shots can be considered.  What Are the Possible Reactions?  The two types of adverse reactions that can occur with allergy shots are local and systemic. Common local reactions include very mild redness and swelling at the injection site, which can happen immediately or several hours after. Report a delayed reaction from your last injection. These include arm swelling or runny nose, watery eyes or cough that occurs within 12-24 hours after injection. A systemic reaction, which is less common, affects the entire body or a particular body system. They are usually  mild and typically respond quickly to medications. Signs include increased allergy symptoms such as sneezing, a stuffy nose or hives.   Rarely, a  serious systemic reaction called anaphylaxis can develop. Symptoms include swelling in the throat, wheezing, a feeling of tightness in the chest, nausea or dizziness. Most serious systemic reactions develop within 30 minutes of allergy shots. This is why it is strongly recommended you wait in your doctor's office for 30 minutes after your injections. Your allergist is trained to watch for reactions, and his or her staff is trained and equipped with the proper medications to identify and treat them.   Report to the nurse immediately if you experience any of the following symptoms: swelling, itching or redness of the skin, hives, watery eyes/nose, breathing difficulty, excessive sneezing, coughing, stomach pain, diarrhea, or light headedness. These symptoms may occur within 15-20 minutes after injection and may require medication.   Who Should Administer Allergy Shots?  The preferred location for receiving shots is your prescribing allergist's office. Injections can sometimes be given at another facility where the physician and staff are trained to recognize and treat reactions, and have received instructions by your prescribing allergist.  What if I am late for an injection?   Injection dose will be adjusted depending upon how many days or weeks you are late for your injection.   What if I am sick?   Please report any illness to the nurse before receiving injections. She may adjust your dose or postpone injections depending on your symptoms. If you have fever, flu, sinus infection or chest congestion it is best to postpone allergy injections until you are better. Never get an allergy injection if your asthma is causing you problems. If your symptoms persist, seek out medical care to get your health problem under control.  What If I am or Become Pregnant:   Women that become pregnant should schedule an appointment with [facility] before receiving any further allergy injections.

## 2023-07-21 ENCOUNTER — Other Ambulatory Visit: Payer: Self-pay

## 2023-07-21 ENCOUNTER — Ambulatory Visit: Payer: Medicaid Other | Admitting: Family

## 2023-07-21 ENCOUNTER — Encounter: Payer: Self-pay | Admitting: Family

## 2023-07-21 VITALS — BP 118/72 | HR 83 | Resp 16

## 2023-07-21 DIAGNOSIS — R053 Chronic cough: Secondary | ICD-10-CM

## 2023-07-21 DIAGNOSIS — J302 Other seasonal allergic rhinitis: Secondary | ICD-10-CM

## 2023-07-21 DIAGNOSIS — K219 Gastro-esophageal reflux disease without esophagitis: Secondary | ICD-10-CM | POA: Diagnosis not present

## 2023-07-21 DIAGNOSIS — J3089 Other allergic rhinitis: Secondary | ICD-10-CM | POA: Diagnosis not present

## 2023-07-21 MED ORDER — CETIRIZINE HCL 10 MG PO TABS
10.0000 mg | ORAL_TABLET | Freq: Every day | ORAL | 1 refills | Status: DC
Start: 2023-07-21 — End: 2023-08-25

## 2023-07-21 MED ORDER — MONTELUKAST SODIUM 10 MG PO TABS
10.0000 mg | ORAL_TABLET | Freq: Every day | ORAL | 1 refills | Status: DC
Start: 2023-07-21 — End: 2023-08-25

## 2023-07-21 MED ORDER — VENTOLIN HFA 108 (90 BASE) MCG/ACT IN AERS: 2.0000 | INHALATION_SPRAY | RESPIRATORY_TRACT | 1 refills | Status: AC | PRN

## 2023-07-21 MED ORDER — PANTOPRAZOLE SODIUM 40 MG PO TBEC: DELAYED_RELEASE_TABLET | ORAL | 0 refills | Status: DC

## 2023-07-21 NOTE — Progress Notes (Signed)
 [address] [place] [address] Dept: [phone]  FOLLOW UP NOTE  Patient ID: Rebecca [name], female    DOB: 06-12-1990  Age: 33 y.o. MRN: [id number] Date of Office Visit: 07/21/2023  Assessment  Chief Complaint: Cough and Allergic Rhinitis   HPI Rebecca [name] is a 33 year old female who presents today for follow-up of chronic cough and perennial and seasonal allergic rhinitis.  She was last seen on May 05, 2023 by Dr. Dellis Anes.  She denies any new diagnosis or surgery since her last office visit.  Chronic cough: She reports that her cough has been over 50% better.  She is currently taking Advair 250/50 1 puff twice a day.  There are nights that she forgets to take the montelukast 10 mg daily.  She can tell a difference when she takes the Singulair.  When she does cough it is a dry cough.  She does have some wheezing, but not all the time.  She will also feel short of breath and sometimes with exertion.  She denies tightness in her chest, nocturnal awakenings due to breathing problems, fever, and chills.  She quit smoking last August or early September.  She uses her albuterol only before walking for exercise.  Since her last office visit she has not required any systemic steroids or made any trips to the emergency room or urgent care due to breathing problems.  Allergic rhinitis: She reports last week and earlier this week she had rhinorrhea and nasal congestion.  For the past 2 days though she has felt good .  She will also have postnasal drip, but not every day.  She has not been treated for any sinus infections since we last saw her.  She is currently out of Zyrtec 10 mg daily for the past 1 to 2 weeks.  When she is on the Zyrtec daily she does feel a lot better.  She does not use Flonase nasal spray because it has caused nosebleeds in the past.  She uses azelastine nasal spray as needed.  She does not really like nose sprays.   Drug Allergies:   Allergies  Allergen Reactions   Nyquil [Pseudoeph-Doxylamine-Dm-Apap] Hives, Itching and Nausea And Vomiting    Itching throat, but pt had no difficulty breathing.   Benadryl Itch Stopping [Diphenhydramine-Zinc Acetate]     Liquid form    Review of Systems: Negative except as per HPI   Physical Exam: BP 118/72 (BP Location: Left Arm, Patient Position: Sitting, Cuff Size: Normal)   Pulse 83   Resp 16   SpO2 99%    Physical Exam Constitutional:      Appearance: Normal appearance.  HENT:     Head: Normocephalic and atraumatic.     Comments: Pharynx normal, eyes normal, ears normal, nose: Bilateral lower turbinates moderately edematous and slightly erythematous with no drainage noted    Right Ear: Tympanic membrane, ear canal and external ear normal.     Left Ear: Tympanic membrane, ear canal and external ear normal.     Mouth/Throat:     Mouth: Mucous membranes are moist.     Pharynx: Oropharynx is clear.  Eyes:     Conjunctiva/sclera: Conjunctivae normal.  Cardiovascular:     Rate and Rhythm: Regular rhythm.     Heart sounds: Normal heart sounds.  Pulmonary:     Effort: Pulmonary effort is normal.     Breath sounds: Normal breath sounds.     Comments: Lungs clear to auscultation Musculoskeletal:  Cervical back: Neck supple.  Skin:    General: Skin is warm.  Neurological:     Mental Status: She is alert and oriented to person, place, and time.  Psychiatric:        Mood and Affect: Mood normal.        Behavior: Behavior normal.        Thought Content: Thought content normal.        Judgment: Judgment normal.    Diagnostics: FVC 3.54 L (109%), FEV1 2.81 L (103%), FEV1/FVC 0.79.  Spirometry indicates normal spirometry.  Assessment and Plan: 1. Seasonal and perennial allergic rhinitis   2. Chronic cough   3. Gastroesophageal reflux disease, unspecified whether esophagitis present     No orders of the defined types were placed in this encounter.   Patient  Instructions  1. Chronic cough -greater than 50% better Your breathing test looks great today! - Continue current regimen. Make sure and take Singulair (montelukast) every day - Daily controller medication(s): Singulair 10mg  daily and Advair 250/92mcg one puff twice daily - Prior to physical activity: albuterol 2 puffs 10-15 minutes before physical activity. - Rescue medications: albuterol 4 puffs every 4-6 hours as needed - Asthma control goals:  * Full participation in all desired activities (may need albuterol before activity) * Albuterol use two time or less a week on average (not counting use with activity) * Cough interfering with sleep two time or less a month * Oral steroids no more than once a year * No hospitalizations  2. Allergic rhinitis - Testing on 05/05/23 was positive to: grasses, ragweed, trees, indoor molds, outdoor molds, dust mites, cat, dog, and cockroach  - Continue avoidance measures. Stop Flonase (fluticasone) due to nose bleeds - Continue taking: Zyrtec (cetirizine) 10mg  tablet once daily, Singulair (montelukast) 10mg  daily - Continue Astelin (azelastine) 2 sprays per nostril 1-2 times daily as needed - You can use an extra dose of the antihistamine, if needed, for breakthrough symptoms.  - Consider nasal saline rinses 1-2 times daily to remove allergens from the nasal cavities as well as help with mucous clearance (this is especially helpful to do before the nasal sprays are given) - Consider allergy shots as a means of long-term control. - Allergy shots "re-train" and "reset" the immune system to ignore environmental allergens and decrease the resulting immune response to those allergens (sneezing, itchy watery eyes, runny nose, nasal congestion, etc).    - Allergy shots improve symptoms in 75-85% of patients.  - We can discuss more at the next appointment if the medications are not working for you.  3.reflux Start a 30 days trial of pantopazole 40 mg once a  day  Start dietary and lifestyle modifications as below  Lifestyle Changes for Controlling GERD When you have GERD, stomach acid feels as if it's backing up toward your mouth. Whether or not you take medication to control your GERD, your symptoms can often be improved with lifestyle changes.   Raise Your Head Reflux is more likely to strike when you're lying down flat, because stomach fluid can flow backward more easily. Raising the head of your bed 4-6 inches can help. To do this: Slide blocks or books under the legs at the head of your bed. Or, place a wedge under the mattress. Many foam stores can make a suitable wedge for you. The wedge should run from your waist to the top of your head. Don't just prop your head on several pillows. This increases pressure on your  stomach. It can make GERD worse.  Watch Your Eating Habits Certain foods may increase the acid in your stomach or relax the lower esophageal sphincter, making GERD more likely. It's best to avoid the following: Coffee, tea, and carbonated drinks (with and without caffeine) Fatty, fried, or spicy food Mint, chocolate, onions, and tomatoes Any other foods that seem to irritate your stomach or cause you pain  Relieve the Pressure Eat smaller meals, even if you have to eat more often. Don't lie down right after you eat. Wait a few hours for your stomach to empty. Avoid tight belts and tight-fitting clothes. Lose excess weight.  Tobacco and Alcohol Avoid smoking tobacco and drinking alcohol. They can make GERD symptoms worse.    Schedule a follow up appointment in 4 weeks or sooner if needed        Reducing Pollen Exposure  The American Academy of Allergy, Asthma and Immunology suggests the following steps to reduce your exposure to pollen during allergy seasons.    Do not hang sheets or clothing out to dry; pollen may collect on these items. Do not mow lawns or spend time around freshly cut grass; mowing  stirs up pollen. Keep windows closed at night.  Keep car windows closed while driving. Minimize morning activities outdoors, a time when pollen counts are usually at their highest. Stay indoors as much as possible when pollen counts or humidity is high and on windy days when pollen tends to remain in the air longer. Use air conditioning when possible.  Many air conditioners have filters that trap the pollen spores. Use a HEPA room air filter to remove pollen form the indoor air you breathe.  Control of Mold Allergen   Mold and fungi can grow on a variety of surfaces provided certain temperature and moisture conditions exist.  Outdoor molds grow on plants, decaying vegetation and soil.  The major outdoor mold, Alternaria and Cladosporium, are found in very high numbers during hot and dry conditions.  Generally, a late Summer - Fall peak is seen for common outdoor fungal spores.  Rain will temporarily lower outdoor mold spore count, but counts rise rapidly when the rainy period ends.  The most important indoor molds are Aspergillus and Penicillium.  Dark, humid and poorly ventilated basements are ideal sites for mold growth.  The next most common sites of mold growth are the bathroom and the kitchen.  Outdoor (Seasonal) Mold Control   Use air conditioning and keep windows closed Avoid exposure to decaying vegetation. Avoid leaf raking. Avoid grain handling. Consider wearing a face mask if working in moldy areas.    Indoor (Perennial) Mold Control    Maintain humidity below 50%. Clean washable surfaces with 5% bleach solution. Remove sources e.g. contaminated carpets.    Control of Dust Mite Allergen    Dust mites play a major role in allergic asthma and rhinitis.  They occur in environments with high humidity wherever human skin is found.  Dust mites absorb humidity from the atmosphere (ie, they do not drink) and feed on organic matter (including shed human and animal skin).  Dust  mites are a microscopic type of insect that you cannot see with the naked eye.  High levels of dust mites have been detected from mattresses, pillows, carpets, upholstered furniture, bed covers, clothes, soft toys and any woven material.  The principal allergen of the dust mite is found in its feces.  A gram of dust may contain 1,000 mites and 250,000 fecal particles.  Mite antigen is easily measured in the air during house cleaning activities.  Dust mites do not bite and do not cause harm to humans, other than by triggering allergies/asthma.    Ways to decrease your exposure to dust mites in your home:  Encase mattresses, box springs and pillows with a mite-impermeable barrier or cover   Wash sheets, blankets and drapes weekly in hot water (130 F) with detergent and dry them in a dryer on the hot setting.  Have the room cleaned frequently with a vacuum cleaner and a damp dust-mop.  For carpeting or rugs, vacuuming with a vacuum cleaner equipped with a high-efficiency particulate air (HEPA) filter.  The dust mite allergic individual should not be in a room which is being cleaned and should wait 1 hour after cleaning before going into the room. Do not sleep on upholstered furniture (eg, couches).   If possible removing carpeting, upholstered furniture and drapery from the home is ideal.  Horizontal blinds should be eliminated in the rooms where the person spends the most time (bedroom, study, television room).  Washable vinyl, roller-type shades are optimal. Remove all non-washable stuffed toys from the bedroom.  Wash stuffed toys weekly like sheets and blankets above.   Reduce indoor humidity to less than 50%.  Inexpensive humidity monitors can be purchased at most hardware stores.  Do not use a humidifier as can make the problem worse and are not recommended.  Control of Dog or Cat Allergen  Avoidance is the best way to manage a dog or cat allergy. If you have a dog or cat and are allergic to dog or  cats, consider removing the dog or cat from the home. If you have a dog or cat but don't want to find it a new home, or if your family wants a pet even though someone in the household is allergic, here are some strategies that may help keep symptoms at bay:  Keep the pet out of your bedroom and restrict it to only a few rooms. Be advised that keeping the dog or cat in only one room will not limit the allergens to that room. Don't pet, hug or kiss the dog or cat; if you do, wash your hands with soap and water. High-efficiency particulate air (HEPA) cleaners run continuously in a bedroom or living room can reduce allergen levels over time. Regular use of a high-efficiency vacuum cleaner or a central vacuum can reduce allergen levels. Giving your dog or cat a bath at least once a week can reduce airborne allergen.  Control of Cockroach Allergen  Cockroach allergen has been identified as an important cause of acute attacks of asthma, especially in urban settings.  There are fifty-five species of cockroach that exist in the [place], however only three, the [place], [place] species produce allergen that can affect patients with Asthma.  Allergens can be obtained from fecal particles, egg casings and secretions from cockroaches.    Remove food sources. Reduce access to water. Seal access and entry points. Spray runways with 0.5-1% Diazinon or Chlorpyrifos Blow boric acid power under stoves and refrigerator. Place bait stations (hydramethylnon) at feeding sites.  Allergy Shots  Allergies are the result of a chain reaction that starts in the immune system. Your immune system controls how your body defends itself. For instance, if you have an allergy to pollen, your immune system identifies pollen as an invader or allergen. Your immune system overreacts by producing antibodies called Immunoglobulin E (IgE). These  antibodies travel to cells that release chemicals, causing an allergic  reaction.  The concept behind allergy immunotherapy, whether it is received in the form of shots or tablets, is that the immune system can be desensitized to specific allergens that trigger allergy symptoms. Although it requires time and patience, the payback can be long-term relief. Allergy injections contain a dilute solution of those substances that you are allergic to based upon your skin testing and allergy history.   How Do Allergy Shots Work?  Allergy shots work much like a vaccine. Your body responds to injected amounts of a particular allergen given in increasing doses, eventually developing a resistance and tolerance to it. Allergy shots can lead to decreased, minimal or no allergy symptoms.  There generally are two phases: build-up and maintenance. Build-up often ranges from three to six months and involves receiving injections with increasing amounts of the allergens. The shots are typically given once or twice a week, though more rapid build-up schedules are sometimes used.  The maintenance phase begins when the most effective dose is reached. This dose is different for each person, depending on how allergic you are and your response to the build-up injections. Once the maintenance dose is reached, there are longer periods between injections, typically two to four weeks.  Occasionally doctors give cortisone-type shots that can temporarily reduce allergy symptoms. These types of shots are different and should not be confused with allergy immunotherapy shots.  Who Can Be Treated with Allergy Shots?  Allergy shots may be a good treatment approach for people with allergic rhinitis (hay fever), allergic asthma, conjunctivitis (eye allergy) or stinging insect allergy.   Before deciding to begin allergy shots, you should consider:   The length of allergy season and the severity of your symptoms  Whether medications and/or changes to your environment can control your symptoms  Your desire  to avoid long-term medication use  Time: allergy immunotherapy requires a major time commitment  Cost: may vary depending on your insurance coverage  Allergy shots for children age 34 and older are effective and often well tolerated. They might prevent the onset of new allergen sensitivities or the progression to asthma.  Allergy shots are not started on patients who are pregnant but can be continued on patients who become pregnant while receiving them. In some patients with other medical conditions or who take certain common medications, allergy shots may be of risk. It is important to mention other medications you talk to your allergist.   What are the two types of build-ups offered:   RUSH or Rapid Desensitization -- one day of injections lasting from 8:30-4:30pm, injections every 1 hour.  Approximately half of the build-up process is completed in that one day.  The following week, normal build-up is resumed, and this entails ~16 visits either weekly or twice weekly, until reaching your "maintenance dose" which is continued weekly until eventually getting spaced out to every month for a duration of 3 to 5 years. The regular build-up appointments are nurse visits where the injections are administered, followed by required monitoring for 30 minutes.    Traditional build-up -- weekly visits for 6 -12 months until reaching "maintenance dose", then continue weekly until eventually spacing out to every 4 weeks as above. At these appointments, the injections are administered, followed by required monitoring for 30 minutes.     Either way is acceptable, and both are equally effective. With the rush protocol, the advantage is that less time is spent here for injections  overall AND you would also reach maintenance dosing faster (which is when the clinical benefit starts to become more apparent). Not everyone is a candidate for rapid desensitization.   IF we proceed with the RUSH protocol, there are  premedications which must be taken the day before and the day after the rush only (this includes antihistamines, steroids, and Singulair).  After the rush day, no prednisone or Singulair is required, and we just recommend antihistamines taken on your injection day.  What Is An Estimate of the Costs?  If you are interested in starting allergy injections, please check with your insurance company about your coverage for both allergy vial sets and allergy injections.  Please do so prior to making the appointment to start injections.  The following are CPT codes to give to your insurance company. These are the amounts we BILL to the insurance company, but the amount YOU WILL PAY and WE RECEIVE IS SUBSTANTIALLY LESS and depends on the contracts we have with different insurance companies.   Amount Billed to Insurance One allergy vial set  CPT 95165   $ 1200     Two allergy vial set  CPT 95165   $ 2400     Three allergy vial set  CPT 95165   $ 3600     One injection   CPT 95115   $ 35  Two injections   CPT 95117   $ 40 RUSH (Rapid Desensitization) CPT 95180 x 8 hours $500/hour  Regarding the allergy injections, your co-pay may or may not apply with each injection, so please confirm this with your insurance company. When you start allergy injections, 1 or 2 sets of vials are made based on your allergies.  Not all patients can be on one set of vials. A set of vials lasts 6 months to a year depending on how quickly you can proceed with your build-up of your allergy injections. Vials are personalized for each patient depending on their specific allergens.  How often are allergy injection given during the build-up period?   Injections are given at least weekly during the build-up period until your maintenance dose is achieved. Per the doctor's discretion, you may have the option of getting allergy injections two times per week during the build-up period. However, there must be at least 48 hours between  injections. The build-up period is usually completed within 6-12 months depending on your ability to schedule injections and for adjustments for reactions. When maintenance dose is reached, your injection schedule is gradually changed to every two weeks and later to every three weeks. Injections will then continue every 4 weeks. Usually, injections are continued for a total of 3-5 years.   When Will I Feel Better?  Some may experience decreased allergy symptoms during the build-up phase. For others, it may take as long as 12 months on the maintenance dose. If there is no improvement after a year of maintenance, your allergist will discuss other treatment options with you.  If you aren't responding to allergy shots, it may be because there is not enough dose of the allergen in your vaccine or there are missing allergens that were not identified during your allergy testing. Other reasons could be that there are high levels of the allergen in your environment or major exposure to non-allergic triggers like tobacco smoke.  What Is the Length of Treatment?  Once the maintenance dose is reached, allergy shots are generally continued for three to five years. The decision to stop should  be discussed with your allergist at that time. Some people may experience a permanent reduction of allergy symptoms. Others may relapse and a longer course of allergy shots can be considered.  What Are the Possible Reactions?  The two types of adverse reactions that can occur with allergy shots are local and systemic. Common local reactions include very mild redness and swelling at the injection site, which can happen immediately or several hours after. Report a delayed reaction from your last injection. These include arm swelling or runny nose, watery eyes or cough that occurs within 12-24 hours after injection. A systemic reaction, which is less common, affects the entire body or a particular body system. They are usually  mild and typically respond quickly to medications. Signs include increased allergy symptoms such as sneezing, a stuffy nose or hives.   Rarely, a serious systemic reaction called anaphylaxis can develop. Symptoms include swelling in the throat, wheezing, a feeling of tightness in the chest, nausea or dizziness. Most serious systemic reactions develop within 30 minutes of allergy shots. This is why it is strongly recommended you wait in your doctor's office for 30 minutes after your injections. Your allergist is trained to watch for reactions, and his or her staff is trained and equipped with the proper medications to identify and treat them.   Report to the nurse immediately if you experience any of the following symptoms: swelling, itching or redness of the skin, hives, watery eyes/nose, breathing difficulty, excessive sneezing, coughing, stomach pain, diarrhea, or light headedness. These symptoms may occur within 15-20 minutes after injection and may require medication.   Who Should Administer Allergy Shots?  The preferred location for receiving shots is your prescribing allergist's office. Injections can sometimes be given at another facility where the physician and staff are trained to recognize and treat reactions, and have received instructions by your prescribing allergist.  What if I am late for an injection?   Injection dose will be adjusted depending upon how many days or weeks you are late for your injection.   What if I am sick?   Please report any illness to the nurse before receiving injections. She may adjust your dose or postpone injections depending on your symptoms. If you have fever, flu, sinus infection or chest congestion it is best to postpone allergy injections until you are better. Never get an allergy injection if your asthma is causing you problems. If your symptoms persist, seek out medical care to get your health problem under control.  What If I am or Become Pregnant:   Women that become pregnant should schedule an appointment with [facility] before receiving any further allergy injections.        Return in about 4 weeks (around 08/18/2023), or if symptoms worsen or fail to improve.    Thank you for the opportunity to care for this patient.  Please do not hesitate to contact me with questions.  Nehemiah Settle, FNP [facility]

## 2023-08-07 ENCOUNTER — Other Ambulatory Visit: Payer: Self-pay | Admitting: Internal Medicine

## 2023-08-25 ENCOUNTER — Other Ambulatory Visit: Payer: Self-pay

## 2023-08-25 ENCOUNTER — Encounter: Payer: Self-pay | Admitting: Allergy & Immunology

## 2023-08-25 ENCOUNTER — Ambulatory Visit: Admitting: Allergy & Immunology

## 2023-08-25 VITALS — BP 126/78 | HR 87 | Temp 98.6°F | Resp 12

## 2023-08-25 DIAGNOSIS — J3089 Other allergic rhinitis: Secondary | ICD-10-CM

## 2023-08-25 DIAGNOSIS — K219 Gastro-esophageal reflux disease without esophagitis: Secondary | ICD-10-CM | POA: Diagnosis not present

## 2023-08-25 DIAGNOSIS — J302 Other seasonal allergic rhinitis: Secondary | ICD-10-CM | POA: Diagnosis not present

## 2023-08-25 DIAGNOSIS — J4540 Moderate persistent asthma, uncomplicated: Secondary | ICD-10-CM | POA: Diagnosis not present

## 2023-08-25 MED ORDER — CETIRIZINE HCL 10 MG PO TABS: 10.0000 mg | ORAL_TABLET | Freq: Every day | ORAL | 1 refills | Status: AC

## 2023-08-25 MED ORDER — FLUTICASONE-SALMETEROL 250-50 MCG/ACT IN AEPB: 1.0000 | INHALATION_SPRAY | Freq: Two times a day (BID) | RESPIRATORY_TRACT | 5 refills | Status: DC

## 2023-08-25 MED ORDER — PANTOPRAZOLE SODIUM 40 MG PO TBEC: 40.0000 mg | DELAYED_RELEASE_TABLET | Freq: Every day | ORAL | 1 refills | Status: AC

## 2023-08-25 MED ORDER — AZELASTINE HCL 0.1 % NA SOLN: 1.0000 | Freq: Two times a day (BID) | NASAL | 5 refills | Status: AC

## 2023-08-25 MED ORDER — MONTELUKAST SODIUM 10 MG PO TABS: 10.0000 mg | ORAL_TABLET | Freq: Every day | ORAL | 1 refills | Status: AC

## 2023-08-25 NOTE — Patient Instructions (Addendum)
 1. Moderate persistent asthma, uncomplicated - Your breathing test looks great today! - We are going to not make any changes at all.  - Daily controller medication(s): Singulair 10mg  daily and Advair 250/51mcg one puff twice daily - Prior to physical activity: albuterol 2 puffs 10-15 minutes before physical activity. - Rescue medications: albuterol 4 puffs every 4-6 hours as needed - Asthma control goals:  * Full participation in all desired activities (may need albuterol before activity) * Albuterol use two time or less a week on average (not counting use with activity) * Cough interfering with sleep two time or less a month * Oral steroids no more than once a year * No hospitalizations  2. Allergic rhinitis (grasses, ragweed, trees, indoor molds, outdoor molds, dust mites, cat, dog, and cockroach) - Testing on 05/05/23 was positive to: grasses, ragweed, trees, indoor molds, outdoor molds, dust mites, cat, dog, and cockroach  - Continue avoidance measures. Stop Flonase (fluticasone) due to nose bleeds - Continue: Zyrtec (cetirizine) 10mg  tablet once daily, Singulair (montelukast) 10mg  daily - Continue Astelin (azelastine) 2 sprays per nostril 1-2 times daily as needed - You can use an extra dose of the antihistamine, if needed, for breakthrough symptoms.  - Consider nasal saline rinses 1-2 times daily to remove allergens from the nasal cavities as well as help with mucous clearance (this is especially helpful to do before the nasal sprays are given) - Consider allergy shots as a means of long-term control. - Allergy shots "re-train" and "reset" the immune system to ignore environmental allergens and decrease the resulting immune response to those allergens (sneezing, itchy watery eyes, runny nose, nasal congestion, etc).    - Allergy shots improve symptoms in 75-85% of patients.  - Allergy shots cure allergies and can help a lot in the long term.   3. Reflux - Continue with pantoprazole  40 mg once a day. - Start dietary and lifestyle modifications as below.  4. Return in about 3 months (around 11/24/2023). You can have the follow up appointment with Dr. Dellis Anes or a Nurse Practicioner (our Nurse Practitioners are excellent and always have Physician oversight!).    Please inform us of any Emergency Department visits, hospitalizations, or changes in symptoms. Call us before going to the ED for breathing or allergy symptoms since we might be able to fit you in for a sick visit. Feel free to contact us anytime with any questions, problems, or concerns.  It was a pleasure to see you and your family again today!  Websites that have reliable patient information: 1. American Academy of Asthma, Allergy, and Immunology: www.aaaai.org 2. Food Allergy Research and Education (FARE): foodallergy.org 3. Mothers of Asthmatics: http://www.asthmacommunitynetwork.org 4. American College of Allergy, Asthma, and Immunology: www.acaai.org      "Like" [place] on Facebook and Instagram for our latest updates!      A healthy democracy works best when [employer] participate! Make sure you are registered to vote! If you have moved or changed any of your contact information, you will need to get this updated before voting! Scan the QR codes below to learn more!

## 2023-08-25 NOTE — Progress Notes (Signed)
 FOLLOW UP  Date of Service/Encounter:  08/25/23   Assessment:   Chronic cough   Perennial and seasonal allergic rhinitis (grasses, ragweed, trees, indoor molds, outdoor molds, dust mites, cat, dog, and cockroach)    Plan/Recommendations:   1. Moderate persistent asthma, uncomplicated - Your breathing test looks great today! - We are going to not make any changes at all.  - Daily controller medication(s): Singulair 10mg  daily and Advair 250/53mcg one puff twice daily - Prior to physical activity: albuterol 2 puffs 10-15 minutes before physical activity. - Rescue medications: albuterol 4 puffs every 4-6 hours as needed - Asthma control goals:  * Full participation in all desired activities (may need albuterol before activity) * Albuterol use two time or less a week on average (not counting use with activity) * Cough interfering with sleep two time or less a month * Oral steroids no more than once a year * No hospitalizations  2. Allergic rhinitis (grasses, ragweed, trees, indoor molds, outdoor molds, dust mites, cat, dog, and cockroach) - Testing on 05/05/23 was positive to: grasses, ragweed, trees, indoor molds, outdoor molds, dust mites, cat, dog, and cockroach  - Continue avoidance measures. Stop Flonase (fluticasone) due to nose bleeds - Continue: Zyrtec (cetirizine) 10mg  tablet once daily, Singulair (montelukast) 10mg  daily - Continue Astelin (azelastine) 2 sprays per nostril 1-2 times daily as needed - You can use an extra dose of the antihistamine, if needed, for breakthrough symptoms.  - Consider nasal saline rinses 1-2 times daily to remove allergens from the nasal cavities as well as help with mucous clearance (this is especially helpful to do before the nasal sprays are given) - Consider allergy shots as a means of long-term control. - Allergy shots "re-train" and "reset" the immune system to ignore environmental allergens and decrease the resulting immune response to  those allergens (sneezing, itchy watery eyes, runny nose, nasal congestion, etc).    - Allergy shots improve symptoms in 75-85% of patients.  - Allergy shots cure allergies and can help a lot in the long term.   3. Reflux - Continue with pantoprazole 40 mg once a day. - Start dietary and lifestyle modifications as below.  4. Return in about 3 months (around 11/24/2023). You can have the follow up appointment with Dr. Dellis Anes or a Nurse Practicioner (our Nurse Practitioners are excellent and always have Physician oversight!).   Subjective:   Rebecca [name] is a 33 y.o. female presenting today for follow up of  Chief Complaint  Patient presents with   Follow-up    Asthma  Allergies No concerns    Rebecca [name] has a history of the following: Patient Active Problem List   Diagnosis Date Noted   Migraine without aura, not intractable, without status migrainosus 05/02/2023   Diarrhea 04/11/2023   Abdominal pain 04/11/2023   Hypertension 03/30/2023   Rectal bleeding 03/30/2023   Uses contraception 03/30/2023   Encounter for immunization 03/30/2023   High grade squamous intraepithelial cervical dysplasia    History of gestational hypertension 03/03/2018   History of abnormal cervical Pap smear 08/31/2017   History of chlamydia 08/22/2017   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  Discussed the use of AI scribe software for clinical note transcription with the patient and/or guardian, who gave verbal consent to proceed.  Rebecca [name] is a 33 y.o. female presenting for a follow up visit.  She was last seen in March 2025 by Thermon Leyland.  At  the time, she was continued on Singulair 10 mg daily and Advair 250 mcg 1 puff twice daily.  Her Spyro was much better than it had been in the past.  She was continued on Zyrtec and Singulair as well as Astelin.  Her Flonase was stopped due to the nosebleeds.  Allergy shots were discussed for  long-term control.  She was started on Protonix to see if this would help with her reflux.  Since last visit, she has been well.  Asthma/Respiratory Symptom History: She experiences breathing difficulties attributed to her asthma. She has not been using her Advair inhaler due to running out, despite having a refill available. 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 Her symptoms are exacerbated by these activities, particularly in one of the older homes that requires more cleaning. She sometimes takes breaks to catch her breath with her husband before continuing work.  Allergic Rhinitis Symptom History: She is currently taking montelukast, which she finds effective, and uses Zyrtec and Astelin for allergies. She has been started on a reflux medication, which she takes before meals, but notes it takes a while to take effect. She is also on amlodipine for blood pressure management and has a follow-up with her primary care doctor scheduled. She has not been on allergy shots, although we have discussed them in the past.  She has not been on antibiotics for any sinus infections or ear infections.  Her blood pressure can be elevated, particularly when stressed or in a rush. She tries to manage it by relaxing and reducing salt intake. She has multiple emergency inhalers at home and ensures that her family members also have access to them.   Otherwise, there have been no changes to her past medical history, surgical history, family history, or social history.    Review of systems otherwise negative other than that mentioned in the HPI.    Objective:   Blood pressure 126/78, pulse 87, temperature 98.6 F (37 C), resp. rate 12, SpO2 97%. There is no  height or weight on file to calculate BMI.    Physical Exam Vitals reviewed.  Constitutional:      Appearance: She is well-developed.     Comments: Pleasant.   HENT:     Head: Normocephalic and atraumatic.     Right Ear: Tympanic membrane, ear canal and external ear normal. No drainage, swelling or tenderness. Tympanic membrane is not injected, scarred, erythematous, retracted or bulging.     Left Ear: Tympanic membrane, ear canal and external ear normal. No drainage, swelling or tenderness. Tympanic membrane is not injected, scarred, erythematous, retracted or bulging.     Nose: No nasal deformity, septal deviation, mucosal edema or rhinorrhea.     Right Turbinates: Enlarged, swollen and pale.     Left Turbinates: Enlarged, swollen and pale.     Right Sinus: No maxillary sinus tenderness or frontal sinus tenderness.     Left Sinus: No maxillary sinus tenderness or frontal sinus tenderness.     Comments: No polyps noted.     Mouth/Throat:     Mouth: Mucous membranes are not pale and not dry.     Pharynx: Uvula midline.  Eyes:     General:        Right eye: No  discharge.        Left eye: No discharge.     Conjunctiva/sclera: Conjunctivae normal.     Right eye: Right conjunctiva is not injected. No chemosis.    Left eye: Left conjunctiva is not injected. No chemosis.    Pupils: Pupils are equal, round, and reactive to light.  Cardiovascular:     Rate and Rhythm: Normal rate and regular rhythm.     Heart sounds: Normal heart sounds.  Pulmonary:     Effort: Pulmonary effort is normal. No tachypnea, accessory muscle usage or respiratory distress.     Breath sounds: Normal breath sounds. No wheezing, rhonchi or rales.  Chest:     Chest wall: No tenderness.  Abdominal:     Tenderness: There is no abdominal tenderness. There is no guarding or rebound.  Lymphadenopathy:     Head:     Right side of head: No submandibular, tonsillar or occipital adenopathy.     Left side of head: No  submandibular, tonsillar or occipital adenopathy.     Cervical: No cervical adenopathy.  Skin:    Coloration: Skin is not pale.     Findings: No abrasion, erythema, petechiae or rash. Rash is not papular, urticarial or vesicular.  Neurological:     Mental Status: She is alert.  Psychiatric:        Behavior: Behavior is cooperative.      Diagnostic studies:    Spirometry: results normal (FEV1: 3.10/114%, FVC: 3.70/115%, FEV1/FVC: 84%).    Spirometry consistent with normal pattern.   Allergy Studies: none       Malachi Bonds, MD  [facility]

## 2023-09-01 ENCOUNTER — Ambulatory Visit: Payer: Medicaid Other | Admitting: Family Medicine

## 2023-09-01 ENCOUNTER — Encounter: Payer: Self-pay | Admitting: Family Medicine

## 2023-09-01 VITALS — BP 136/80 | HR 83 | Resp 18 | Ht 63.0 in | Wt 181.0 lb

## 2023-09-01 DIAGNOSIS — R7301 Impaired fasting glucose: Secondary | ICD-10-CM | POA: Diagnosis not present

## 2023-09-01 DIAGNOSIS — E038 Other specified hypothyroidism: Secondary | ICD-10-CM

## 2023-09-01 DIAGNOSIS — I10 Essential (primary) hypertension: Secondary | ICD-10-CM

## 2023-09-01 DIAGNOSIS — G43009 Migraine without aura, not intractable, without status migrainosus: Secondary | ICD-10-CM

## 2023-09-01 DIAGNOSIS — E559 Vitamin D deficiency, unspecified: Secondary | ICD-10-CM | POA: Diagnosis not present

## 2023-09-01 DIAGNOSIS — E7849 Other hyperlipidemia: Secondary | ICD-10-CM

## 2023-09-01 MED ORDER — SUMATRIPTAN SUCCINATE 25 MG PO TABS: 25.0000 mg | ORAL_TABLET | ORAL | 0 refills | Status: DC | PRN

## 2023-09-01 NOTE — Assessment & Plan Note (Signed)
 Controlled Asymptomatic in the clinic Encouraged to continue take amlodipine  5 mg daily Low-sodium diet with increase physical activity encouraged BP Readings from Last 3 Encounters:  09/01/23 136/80  08/25/23 126/78  07/21/23 118/72
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
## 2023-09-01 NOTE — Assessment & Plan Note (Addendum)
 The patient reports that sumatriptan  50 mg was too strong for her, leading her to break the medication in half and take 25 mg as needed instead. A prescription will be called in for sumatriptan  25 mg to be taken as needed for episodic migraine episodes. Encouraged migraine prevention and management strategies. Lifestyle Changes: Decrease or avoid caffeine and alcohol: Reducing intake can help minimize migraine triggers. Eat and sleep on a regular schedule: Consistency in meal and sleep times can reduce the frequency of migraines. Exercise several times per week: Regular physical activity can help reduce migraine frequency and severity.

## 2023-09-01 NOTE — Patient Instructions (Addendum)
I appreciate the opportunity to provide care to you today!    Follow up:  4 months  Labs: please stop by the lab during the week to get your blood drawn (CBC, CMP, TSH, Lipid profile, HgA1c, Vit D)    Please continue to a heart-healthy diet and increase your physical activities. Try to exercise for at least five days a week.    It was a pleasure to see you and I look forward to continuing to work together on your health and well-being. Please do not hesitate to call the office if you need care or have questions about your care.  In case of emergency, please visit the Emergency Department for urgent care, or contact our clinic at [phone] to schedule an appointment. We're here to help you!   Have a wonderful day and week. With Gratitude, Gilmore Laroche MSN, FNP-BC

## 2023-09-02 LAB — CBC WITH DIFFERENTIAL/PLATELET
Basophils Absolute: 0.1 10*3/uL (ref 0.0–0.2)
Basos: 1 %
EOS (ABSOLUTE): 0.1 10*3/uL (ref 0.0–0.4)
Eos: 2 %
Hematocrit: 39.4 % (ref 34.0–46.6)
Hemoglobin: 13.2 g/dL (ref 11.1–15.9)
Immature Grans (Abs): 0 10*3/uL (ref 0.0–0.1)
Immature Granulocytes: 0 %
Lymphocytes Absolute: 1.9 10*3/uL (ref 0.7–3.1)
Lymphs: 33 %
MCH: 28 pg (ref 26.6–33.0)
MCHC: 33.5 g/dL (ref 31.5–35.7)
MCV: 84 fL (ref 79–97)
Monocytes Absolute: 0.5 10*3/uL (ref 0.1–0.9)
Monocytes: 8 %
Neutrophils Absolute: 3.2 10*3/uL (ref 1.4–7.0)
Neutrophils: 56 %
Platelets: 338 10*3/uL (ref 150–450)
RBC: 4.72 x10E6/uL (ref 3.77–5.28)
RDW: 11.8 % (ref 11.7–15.4)
WBC: 5.8 10*3/uL (ref 3.4–10.8)

## 2023-09-02 LAB — LIPID PANEL
Chol/HDL Ratio: 3.3 ratio (ref 0.0–4.4)
Cholesterol, Total: 195 mg/dL (ref 100–199)
HDL: 60 mg/dL (ref >39)
LDL Chol Calc (NIH): 119 mg/dL — ABNORMAL HIGH (ref 0–99)
Triglycerides: 88 mg/dL (ref 0–149)
VLDL Cholesterol Cal: 16 mg/dL (ref 5–40)

## 2023-09-02 LAB — CMP14+EGFR
ALT: 16 IU/L (ref 0–32)
AST: 14 IU/L (ref 0–40)
Albumin: 4.1 g/dL (ref 3.9–4.9)
Alkaline Phosphatase: 94 IU/L (ref 44–121)
BUN/Creatinine Ratio: 11 (ref 9–23)
BUN: 9 mg/dL (ref 6–20)
Bilirubin Total: 0.5 mg/dL (ref 0.0–1.2)
CO2: 20 mmol/L (ref 20–29)
Calcium: 9.1 mg/dL (ref 8.7–10.2)
Chloride: 105 mmol/L (ref 96–106)
Creatinine, Ser: 0.8 mg/dL (ref 0.57–1.00)
Globulin, Total: 2.5 g/dL (ref 1.5–4.5)
Glucose: 112 mg/dL — ABNORMAL HIGH (ref 70–99)
Potassium: 4.3 mmol/L (ref 3.5–5.2)
Sodium: 140 mmol/L (ref 134–144)
Total Protein: 6.6 g/dL (ref 6.0–8.5)
eGFR: 100 mL/min/{1.73_m2} (ref >59)

## 2023-09-02 LAB — VITAMIN D 25 HYDROXY (VIT D DEFICIENCY, FRACTURES): Vit D, 25-Hydroxy: 17.7 ng/mL — ABNORMAL LOW (ref 30.0–100.0)

## 2023-09-02 LAB — HEMOGLOBIN A1C
Est. average glucose Bld gHb Est-mCnc: 114 mg/dL
Hgb A1c MFr Bld: 5.6 % (ref 4.8–5.6)

## 2023-09-02 LAB — TSH+FREE T4
Free T4: 1.03 ng/dL (ref 0.82–1.77)
TSH: 1.61 u[IU]/mL (ref 0.450–4.500)

## 2023-09-04 ENCOUNTER — Other Ambulatory Visit: Payer: Self-pay

## 2023-09-04 ENCOUNTER — Emergency Department (HOSPITAL_COMMUNITY): Admission: EM | Admit: 2023-09-04 | Discharge: 2023-09-04 | Disposition: A | Discharge status: Home / Self Care

## 2023-09-04 ENCOUNTER — Encounter (HOSPITAL_COMMUNITY): Payer: Self-pay | Admitting: Emergency Medicine

## 2023-09-04 ENCOUNTER — Emergency Department (HOSPITAL_COMMUNITY)

## 2023-09-04 DIAGNOSIS — R1030 Lower abdominal pain, unspecified: Secondary | ICD-10-CM | POA: Diagnosis present

## 2023-09-04 LAB — COMPREHENSIVE METABOLIC PANEL WITH GFR
ALT: 20 U/L (ref 0–44)
AST: 21 U/L (ref 15–41)
Albumin: 4 g/dL (ref 3.5–5.0)
Alkaline Phosphatase: 97 U/L (ref 38–126)
Anion gap: 10 (ref 5–15)
BUN: 12 mg/dL (ref 6–20)
CO2: 21 mmol/L — ABNORMAL LOW (ref 22–32)
Calcium: 9.1 mg/dL (ref 8.9–10.3)
Chloride: 107 mmol/L (ref 98–111)
Creatinine, Ser: 0.71 mg/dL (ref 0.44–1.00)
GFR, Estimated: >60 mL/min (ref >60)
Glucose, Bld: 101 mg/dL — ABNORMAL HIGH (ref 70–99)
Potassium: 3.4 mmol/L — ABNORMAL LOW (ref 3.5–5.1)
Sodium: 138 mmol/L (ref 135–145)
Total Bilirubin: 1.1 mg/dL (ref 0.0–1.2)
Total Protein: 7.4 g/dL (ref 6.5–8.1)

## 2023-09-04 LAB — WET PREP, GENITAL
Sperm: NONE SEEN
Trich, Wet Prep: NONE SEEN
WBC, Wet Prep HPF POC: <10 (ref <10)
Yeast Wet Prep HPF POC: NONE SEEN

## 2023-09-04 LAB — CBC
HCT: 40.2 % (ref 36.0–46.0)
Hemoglobin: 13.1 g/dL (ref 12.0–15.0)
MCH: 27.8 pg (ref 26.0–34.0)
MCHC: 32.6 g/dL (ref 30.0–36.0)
MCV: 85.2 fL (ref 80.0–100.0)
Platelets: 394 10*3/uL (ref 150–400)
RBC: 4.72 MIL/uL (ref 3.87–5.11)
RDW: 12.9 % (ref 11.5–15.5)
WBC: 7 10*3/uL (ref 4.0–10.5)
nRBC: 0 % (ref 0.0–0.2)

## 2023-09-04 LAB — URINALYSIS, ROUTINE W REFLEX MICROSCOPIC
Bilirubin Urine: NEGATIVE
Glucose, UA: NEGATIVE mg/dL
Hgb urine dipstick: NEGATIVE
Ketones, ur: NEGATIVE mg/dL
Leukocytes,Ua: NEGATIVE
Nitrite: NEGATIVE
Protein, ur: NEGATIVE mg/dL
Specific Gravity, Urine: 1.021 (ref 1.005–1.030)
pH: 5 (ref 5.0–8.0)

## 2023-09-04 LAB — LIPASE, BLOOD: Lipase: 22 U/L (ref 11–51)

## 2023-09-04 LAB — POC URINE PREG, ED: Preg Test, Ur: NEGATIVE

## 2023-09-04 MED ORDER — HYDROCODONE-ACETAMINOPHEN 5-325 MG PO TABS: ORAL_TABLET | ORAL | 0 refills | Status: DC

## 2023-09-04 MED ORDER — DOXYCYCLINE HYCLATE 100 MG PO CAPS: 100.0000 mg | ORAL_CAPSULE | Freq: Two times a day (BID) | ORAL | 0 refills | Status: DC

## 2023-09-04 MED ORDER — KETOROLAC TROMETHAMINE 30 MG/ML IJ SOLN
30.0000 mg | Freq: Once | INTRAMUSCULAR | Status: AC
  Administered 2023-09-04: 30 mg via INTRAVENOUS
  Filled 2023-09-04: qty 1

## 2023-09-04 MED ORDER — DICYCLOMINE HCL 10 MG PO CAPS
10.0000 mg | ORAL_CAPSULE | Freq: Once | ORAL | Status: AC
  Administered 2023-09-04: 10 mg via ORAL
  Filled 2023-09-04: qty 1

## 2023-09-04 MED ORDER — LIDOCAINE HCL (PF) 1 % IJ SOLN
INTRAMUSCULAR | Status: AC
  Administered 2023-09-04: 5 mL
  Filled 2023-09-04: qty 5

## 2023-09-04 MED ORDER — CEFTRIAXONE SODIUM 500 MG IJ SOLR
500.0000 mg | Freq: Once | INTRAMUSCULAR | Status: AC
  Administered 2023-09-04: 500 mg via INTRAMUSCULAR
  Filled 2023-09-04: qty 500

## 2023-09-04 MED ORDER — IOHEXOL 300 MG/ML  SOLN
100.0000 mL | Freq: Once | INTRAMUSCULAR | Status: AC | PRN
  Administered 2023-09-04: 100 mL via INTRAVENOUS

## 2023-09-04 MED ORDER — METRONIDAZOLE 500 MG PO TABS: 500.0000 mg | ORAL_TABLET | Freq: Two times a day (BID) | ORAL | 0 refills | Status: DC

## 2023-09-04 MED ORDER — PANTOPRAZOLE SODIUM 40 MG IV SOLR
40.0000 mg | Freq: Once | INTRAVENOUS | Status: AC
  Administered 2023-09-04: 40 mg via INTRAVENOUS
  Filled 2023-09-04: qty 10

## 2023-09-04 NOTE — ED Notes (Signed)
 Pt verbalized blood in her stool yesterday.

## 2023-09-04 NOTE — Discharge Instructions (Signed)
 As discussed I recommend that you apply warm heat on and off to your lower abdominal area.  Take the antibiotic as directed until finished.  [facility] to arrange follow-up appointment as you will likely need a pelvic ultrasound.  Also recommend that you call your GI provider to schedule your colonoscopy.  Return to the emergency department if you develop any new or worsening symptoms.

## 2023-09-04 NOTE — ED Triage Notes (Signed)
 Pt presents with abdominal pain and rectal bleeding (hemorrhoids) since yesterday.

## 2023-09-04 NOTE — ED Provider Notes (Signed)
 [place] EMERGENCY DEPARTMENT AT [facility] Provider Note   CSN: [id number] Arrival date & time: 09/04/23  1322     History  Chief Complaint  Patient presents with   Abdominal Pain    Rebecca [name] is a 33 y.o. female.   Abdominal Pain Associated symptoms: diarrhea   Associated symptoms: no chest pain, no chills, no dysuria, no fever, no nausea, no shortness of breath and no vomiting       Rebecca [name] is a 33 y.o. female who presents to the Emergency Department complaining of diffuse lower abdominal pain and rectal bleeding.  Symptoms began yesterday.  Notes crampy pain similar to contractions that began yesterday.  After significant crampy pain she felt need to move her bowels.  She notes having watery diarrhea with bright red blood and small blood clots after wiping from her rectum.  She notes having similar symptoms back in January was scheduled to have colonoscopy but canceled the procedure.  Denies taking excessive aspirin, Goody powders or ibuprofen .  No nausea or vomiting.  She notes waxing and waning of the crampy abdominal pain since onset.  Home Medications Prior to Admission medications   Medication Sig Start Date End Date Taking? Authorizing Provider  albuterol  (VENTOLIN  HFA) 108 (90 Base) MCG/ACT inhaler Inhale 2 puffs into the lungs every 6 (six) hours as needed for wheezing or shortness of breath. 03/01/23   Leath-Warren, Belen Bowers, NP  amLODipine  (NORVASC ) 5 MG tablet Take 1 tablet (5 mg total) by mouth daily. 06/05/23   Zarwolo, Gloria, FNP  azelastine  (ASTELIN ) 0.1 % nasal spray Place 1 spray into both nostrils 2 (two) times daily. Use in each nostril as directed 08/25/23   Rochester Chuck, MD  cetirizine  (ZYRTEC ) 10 MG tablet Take 1 tablet (10 mg total) by mouth daily. 08/25/23   Rochester Chuck, MD  fluticasone -salmeterol (ADVAIR DISKUS) 250-50 MCG/ACT AEPB Inhale 1 puff into the lungs in the morning and at bedtime.  08/25/23 09/24/23  Rochester Chuck, MD  hydrocortisone  (ANUSOL -HC) 2.5 % rectal cream Place 1 Application rectally 2 (two) times daily as needed for hemorrhoids or anal itching. 04/11/23   Lanney Pitts, PA-C  montelukast  (SINGULAIR ) 10 MG tablet Take 1 tablet (10 mg total) by mouth at bedtime. 08/25/23   Rochester Chuck, MD  pantoprazole  (PROTONIX ) 40 MG tablet Take 1 tablet (40 mg total) by mouth daily. 08/25/23   Rochester Chuck, MD  SUMAtriptan  (IMITREX ) 25 MG tablet Take 1 tablet (25 mg total) by mouth every 2 (two) hours as needed for migraine. May repeat in 2 hours if headache persists or recurs. 09/01/23   Zarwolo, Gloria, FNP  VENTOLIN  HFA 108 (90 Base) MCG/ACT inhaler Inhale 2 puffs into the lungs every 4 (four) hours as needed for wheezing or shortness of breath. 07/21/23   Tinnie Forehand, FNP  Vitamin D , Ergocalciferol , (DRISDOL ) 1.25 MG (50000 UNIT) CAPS capsule Take 1 capsule (50,000 Units total) by mouth every 7 (seven) days. 04/06/23   Zarwolo, Gloria, FNP      Allergies    Nyquil [pseudoeph-doxylamine -dm-apap] and Benadryl  itch stopping [diphenhydramine -zinc acetate]    Review of Systems   Review of Systems  Constitutional:  Negative for appetite change, chills and fever.  Respiratory:  Negative for shortness of breath.   Cardiovascular:  Negative for chest pain.  Gastrointestinal:  Positive for abdominal pain, anal bleeding, blood in stool, diarrhea and rectal pain. Negative for nausea and vomiting.  Genitourinary:  Negative  for dysuria and flank pain.  Musculoskeletal:  Negative for back pain and neck pain.  Skin:  Negative for rash.  Neurological:  Negative for dizziness, syncope, weakness, numbness and headaches.    Physical Exam Updated Vital Signs BP 135/71   Pulse 60   Temp 98.3 F (36.8 C) (Oral)   Resp 11   Ht 5\' 3"  (1.6 m)   Wt 81.6 kg   SpO2 100%   BMI 31.89 kg/m  Physical Exam Vitals and nursing note reviewed. Exam conducted with a  chaperone present.  Constitutional:      General: She is not in acute distress.    Appearance: She is well-developed. She is not ill-appearing or toxic-appearing.  HENT:     Mouth/Throat:     Mouth: Mucous membranes are moist.     Pharynx: Oropharynx is clear.  Cardiovascular:     Rate and Rhythm: Normal rate and regular rhythm.     Pulses: Normal pulses.  Pulmonary:     Effort: Pulmonary effort is normal.  Abdominal:     General: There is no distension.     Palpations: Abdomen is soft.     Tenderness: There is no abdominal tenderness.     Comments: Diffuse tenderness to palpation lower abdomen.  Abdomen is soft no guarding or rebound tenderness.  Genitourinary:    Cervix: Cervical motion tenderness present. No cervical bleeding.     Uterus: Tender. Not enlarged.      Adnexa:        Right: No mass or tenderness.         Left: No mass or tenderness.       Comments: Cervical motion tenderness on exam.  No blood in the vaginal vault, no bleeding from the cervix.  No palpable adnexal masses.  Musculoskeletal:        General: Normal range of motion.  Skin:    General: Skin is warm.     Capillary Refill: Capillary refill takes less than 2 seconds.  Neurological:     General: No focal deficit present.     Mental Status: She is alert.     Sensory: No sensory deficit.     Motor: No weakness.     ED Results / Procedures / Treatments   Labs (all labs ordered are listed, but only abnormal results are displayed) Labs Reviewed  WET PREP, GENITAL - Abnormal; Notable for the following components:      Result Value   Clue Cells Wet Prep HPF POC PRESENT (*)    All other components within normal limits  COMPREHENSIVE METABOLIC PANEL WITH GFR - Abnormal; Notable for the following components:   Potassium 3.4 (*)    CO2 21 (*)    Glucose, Bld 101 (*)    All other components within normal limits  LIPASE, BLOOD  CBC  URINALYSIS, ROUTINE W REFLEX MICROSCOPIC  POC URINE PREG, ED   GC/CHLAMYDIA PROBE AMP ([place]) NOT AT [facility]    EKG None  Radiology CT ABDOMEN PELVIS W CONTRAST Result Date: 09/04/2023 CLINICAL DATA:  Left lower quadrant and right lower quadrant pain with rectal bleeding. EXAM: CT ABDOMEN AND PELVIS WITH CONTRAST TECHNIQUE: Multidetector CT imaging of the abdomen and pelvis was performed using the standard protocol following bolus administration of intravenous contrast. RADIATION DOSE REDUCTION: This exam was performed according to the departmental dose-optimization program which includes automated exposure control, adjustment of the mA and/or kV according to patient size and/or use of iterative reconstruction technique. CONTRAST:  OMNIPAQUE  IOHEXOL  300 MG/ML  SOLN COMPARISON:  None Available. FINDINGS: Lower chest: No acute abnormality. Hepatobiliary: No focal liver abnormality is seen. No gallstones, gallbladder wall thickening, or biliary dilatation. Pancreas: Unremarkable. No pancreatic ductal dilatation or surrounding inflammatory changes. Spleen: Normal in size without focal abnormality. Adrenals/Urinary Tract: Adrenal glands are unremarkable. Kidneys are normal, without renal calculi, focal lesion, or hydronephrosis. Bladder is unremarkable. Stomach/Bowel: Stomach is within normal limits. Appendix appears normal. No evidence of bowel wall thickening, distention, or inflammatory changes. There are few scattered colonic diverticula. Vascular/Lymphatic: The aorta and IVC are normal in size. No enlarged lymph nodes are seen. Reproductive: Focal area of fluid or thickening is seen in the lower uterine segment endometrium measuring 2.7 x 2.3 cm. The adnexa are within normal limits. Other: No abdominal wall hernia or abnormality. No abdominopelvic ascites. Musculoskeletal: No acute or significant osseous findings. IMPRESSION: 1. No acute localizing process in the abdomen or pelvis. 2. Focal area of fluid or thickening in the lower uterine segment endometrium  measuring 2.7 cm. Recommend further evaluation with pelvic ultrasound. 3. Colonic diverticulosis without evidence for diverticulitis. Electronically Signed   By: Tyron Gallon M.D.   On: 09/04/2023 19:24    Procedures Procedures    Medications Ordered in ED Medications  dicyclomine  (BENTYL ) capsule 10 mg (has no administration in time range)  pantoprazole  (PROTONIX ) injection 40 mg (has no administration in time range)    ED Course/ Medical Decision Making/ A&P                                 Medical Decision Making Patient here with waxing and waning lower abdominal pain since yesterday.  She describes having waves of pain severe at times, with watery loose stools yesterday with hematochezia and blood clots.  Notes history of same back in January was scheduled to have colonoscopy but canceled the procedure.  No upper abdominal pain nausea, fever or vomiting.  Rectal pain with bleeding possibly related to hemorrhoids, acute GI bleed, colitis, diverticulum also considered.  Amount and/or Complexity of Data Reviewed Labs: ordered.    Details: Labs interpreted by me, no evidence of leukocytosis, hemoglobin reassuring at 13.  Urinalysis without evidence of infection.  Lipase unremarkable, pregnancy test negative chemistries without significant change. Radiology: ordered.    Details: CT abdomen and pelvis no acute localizing process in the abdomen or pelvis.  There is focal area of fluid or thickening of the lower uterine segment endometrium at 2.7 cm Discussion of management or test interpretation with external provider(s): Patient is hemodynamically stable, feeling better after IV Toradol , Bentyl  and Protonix . Source of patient's reported bleeding is unclear at this time.  No obvious rectal source of bleeding.  There was no vaginal bleeding on pelvic exam.  Previous episode of bleeding previously possibly related to bleeding internal hemorrhoid.  Will treat for presumptive PID, given IM  Rocephin  here.  I recommended close outpatient follow-up with OB/GYN.  Return precautions were also discussed.  Patient also agreeable to outpatient follow-up with GI to reschedule her colonoscopy.  Risk Prescription drug management.           Final Clinical Impression(s) / ED Diagnoses Final diagnoses:  Lower abdominal pain    Rx / DC Orders ED Discharge Orders     None         Mitzie Anda 09/04/23 2136    Carin Charleston, MD 09/04/23 [phone]

## 2023-09-06 ENCOUNTER — Ambulatory Visit: Admitting: Adult Health

## 2023-09-06 ENCOUNTER — Encounter: Payer: Self-pay | Admitting: Adult Health

## 2023-09-06 ENCOUNTER — Encounter: Payer: Self-pay | Admitting: Gastroenterology

## 2023-09-06 ENCOUNTER — Ambulatory Visit: Admitting: Gastroenterology

## 2023-09-06 ENCOUNTER — Other Ambulatory Visit (HOSPITAL_COMMUNITY)
Admission: RE | Admit: 2023-09-06 | Discharge: 2023-09-06 | Disposition: A | Discharge status: Home / Self Care | Source: Ambulatory Visit | Attending: Adult Health | Admitting: Adult Health

## 2023-09-06 VITALS — BP 131/82 | HR 75 | Ht 63.0 in | Wt 179.0 lb

## 2023-09-06 VITALS — BP 132/72 | HR 83 | Temp 98.4°F | Ht 63.0 in | Wt 181.0 lb

## 2023-09-06 DIAGNOSIS — N911 Secondary amenorrhea: Secondary | ICD-10-CM

## 2023-09-06 DIAGNOSIS — Z8742 Personal history of other diseases of the female genital tract: Secondary | ICD-10-CM | POA: Diagnosis not present

## 2023-09-06 DIAGNOSIS — R9389 Abnormal findings on diagnostic imaging of other specified body structures: Secondary | ICD-10-CM

## 2023-09-06 DIAGNOSIS — R102 Pelvic and perineal pain: Secondary | ICD-10-CM

## 2023-09-06 DIAGNOSIS — K625 Hemorrhage of anus and rectum: Secondary | ICD-10-CM

## 2023-09-06 DIAGNOSIS — R109 Unspecified abdominal pain: Secondary | ICD-10-CM

## 2023-09-06 DIAGNOSIS — Z1151 Encounter for screening for human papillomavirus (HPV): Secondary | ICD-10-CM | POA: Insufficient documentation

## 2023-09-06 DIAGNOSIS — K649 Unspecified hemorrhoids: Secondary | ICD-10-CM | POA: Diagnosis not present

## 2023-09-06 DIAGNOSIS — K529 Noninfective gastroenteritis and colitis, unspecified: Secondary | ICD-10-CM | POA: Diagnosis not present

## 2023-09-06 DIAGNOSIS — Z124 Encounter for screening for malignant neoplasm of cervix: Secondary | ICD-10-CM | POA: Insufficient documentation

## 2023-09-06 DIAGNOSIS — R197 Diarrhea, unspecified: Secondary | ICD-10-CM

## 2023-09-06 LAB — GC/CHLAMYDIA PROBE AMP (~~LOC~~) NOT AT ARMC
Chlamydia: NEGATIVE
Comment: NEGATIVE
Comment: NORMAL
Neisseria Gonorrhea: NEGATIVE
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
## 2023-09-06 NOTE — Progress Notes (Signed)
  Subjective:     Patient ID: Rebecca [name], female   DOB: 09-23-1990, 33 y.o.   MRN: [id number]  HPI Rebecca [name] is a 33 year old black female, single, [id number] in for follow up on being seen in ER 09/04/23 at [facility] for stomach pains like contractions, and she had rectal bleeding. CT showed thickened endometrium of 2.7 cm,and diverticulosis,  she has not had period in 5 years was on depo til last year. UPT was negative in ER, GC/CHL pending and CBC was normal. But she was treated for PID, had CMT in ER, and received Rocephin  IM and rx for doxycycline  and flagyl  and Vicodin.  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   PCP is Gloria Zarwolo NP  Review of Systems +stomach pain No period in 5 years +rectal bleeding Denies any hot flashes Reviewed past medical,surgical, social and family history. Reviewed medications and allergies.     Objective:   Physical Exam BP 131/82 (BP Location: Left Arm, Patient Position: Sitting, Cuff Size: Normal)   Pulse 75   Ht 5\' 3"  (1.6 m)   Wt 179 lb (81.2 kg)   BMI 31.71 kg/m     Skin warm and dry.Pelvic: external genitalia is normal in appearance no lesions, vagina: pink,urethra has no lesions or masses noted, cervix:smooth and bulbous, pap with HR HPV genotyping performed, no CMT, uterus: normal size, shape and contour, mildly tender, no masses felt, adnexa: no masses or tenderness noted. Bladder is non tender and no masses felt.   Upstream - 09/06/23 1122       Pregnancy Intention Screening   Does the patient want to become pregnant in the next year? No    Does the patient's partner want to become pregnant in the next year? No    Would the patient like to discuss contraceptive options today? No      Contraception Wrap Up   Current Method Abstinence    End Method Abstinence;Female Condom            Examination chaperoned by Alphonso Aschoff LPN  Assessment:     1. Routine Papanicolaou  smear Pap sent  - Cytology - PAP( [place])  2. Pelvic pain (Primary) +pain like contractions will get US  to assess uterus and ovaries further, 09/08/23 at [facility]  - US  PELVIC COMPLETE WITH TRANSVAGINAL; Future  3. Amenorrhea, secondary No period in 5 years, was on depo but stopped last year Will check FSH and Prolactin level  - Follicle stimulating hormone - Prolactin  4. Thickened endometrium ECC 2.7 cm on CT will get US  to assess further 09/08/23 at 9:15 am at [facility] - US  PELVIC COMPLETE WITH TRANSVAGINAL; Future  5. History of abnormal cervical Pap smear Pap sent   6. Rectal bleeding To see GI today Had CT in ER 09/04/23, diverticulosis     Plan:     Follow up in 1 week for ROS and discuss US  results

## 2023-09-06 NOTE — Patient Instructions (Signed)
 Complete your antibiotics. Try taking 1/2 your hydrocodone -acetaminophen  if you feel it is too strong. You can use plain acetaminophen  as per package instructions but do not take with they hydrocodone  as it also has acetaminophen  in it.  We will get you scheduled for a colonoscopy after your complete your ultrasound and antibiotics.

## 2023-09-07 LAB — PROLACTIN: Prolactin: 6.4 ng/mL (ref 4.8–33.4)

## 2023-09-07 LAB — FOLLICLE STIMULATING HORMONE: FSH: 9.5 m[IU]/mL

## 2023-09-08 ENCOUNTER — Ambulatory Visit (HOSPITAL_COMMUNITY)
Admission: RE | Admit: 2023-09-08 | Discharge: 2023-09-08 | Disposition: A | Discharge status: Home / Self Care | Source: Ambulatory Visit | Attending: Adult Health | Admitting: Adult Health

## 2023-09-08 DIAGNOSIS — R102 Pelvic and perineal pain: Secondary | ICD-10-CM | POA: Diagnosis present

## 2023-09-08 DIAGNOSIS — R9389 Abnormal findings on diagnostic imaging of other specified body structures: Secondary | ICD-10-CM | POA: Insufficient documentation

## 2023-09-12 ENCOUNTER — Other Ambulatory Visit: Payer: Self-pay | Admitting: Family Medicine

## 2023-09-12 ENCOUNTER — Encounter: Payer: Self-pay | Admitting: Family Medicine

## 2023-09-12 DIAGNOSIS — E559 Vitamin D deficiency, unspecified: Secondary | ICD-10-CM

## 2023-09-12 LAB — CYTOLOGY - PAP
Adequacy: ABSENT
Comment: NEGATIVE
Comment: NEGATIVE
Comment: NEGATIVE
Diagnosis: UNDETERMINED — AB
HPV 16: NEGATIVE
HPV 18 / 45: NEGATIVE
High risk HPV: POSITIVE — AB

## 2023-09-12 MED ORDER — VITAMIN D (ERGOCALCIFEROL) 1.25 MG (50000 UNIT) PO CAPS: 50000.0000 [IU] | ORAL_CAPSULE | ORAL | 1 refills | Status: DC

## 2023-09-13 ENCOUNTER — Ambulatory Visit: Admitting: Adult Health

## 2023-09-13 ENCOUNTER — Encounter: Payer: Self-pay | Admitting: Adult Health

## 2023-09-13 VITALS — BP 135/82 | HR 103 | Ht 64.0 in | Wt 179.0 lb

## 2023-09-13 DIAGNOSIS — N911 Secondary amenorrhea: Secondary | ICD-10-CM

## 2023-09-13 DIAGNOSIS — R87610 Atypical squamous cells of undetermined significance on cytologic smear of cervix (ASC-US): Secondary | ICD-10-CM | POA: Diagnosis not present

## 2023-09-13 DIAGNOSIS — Z3202 Encounter for pregnancy test, result negative: Secondary | ICD-10-CM | POA: Diagnosis not present

## 2023-09-13 DIAGNOSIS — R87810 Cervical high risk human papillomavirus (HPV) DNA test positive: Secondary | ICD-10-CM | POA: Diagnosis not present

## 2023-09-13 DIAGNOSIS — R102 Pelvic and perineal pain: Secondary | ICD-10-CM | POA: Diagnosis not present

## 2023-09-13 LAB — POCT URINE PREGNANCY: Preg Test, Ur: NEGATIVE

## 2023-09-13 MED ORDER — MEDROXYPROGESTERONE ACETATE 10 MG PO TABS: ORAL_TABLET | ORAL | 0 refills | Status: AC

## 2023-09-13 NOTE — Progress Notes (Signed)
  Subjective:     Patient ID: Rebecca [name], female   DOB: 12-16-1990, 33 y.o.   MRN: [id number]  HPI Rebecca [name] is a 33 year old black female,single, [id number] in to review US . Pelvic pain has resolved, has seen GI and to see them after finished meds.  Had fluid on CT in endometrium.  Still no period.     Component Value Date/Time   DIAGPAP (A) 09/06/2023 1145    - Atypical squamous cells of undetermined significance (ASC-US )   DIAGPAP  08/16/2017 0000    NEGATIVE FOR INTRAEPITHELIAL LESIONS OR MALIGNANCY.   HPVHIGH Positive (A) 09/06/2023 1145   ADEQPAP  09/06/2023 1145    Satisfactory for evaluation; transformation zone component ABSENT.   ADEQPAP  08/16/2017 0000    Satisfactory for evaluation  endocervical/transformation zone component PRESENT.   PCP is Gloria Zarwolo NP   Review of Systems Still no period Pelvic pain has resolved Reviewed past medical,surgical, social and family history. Reviewed medications and allergies.     Objective:   Physical Exam BP 135/82 (BP Location: Left Arm, Patient Position: Sitting, Cuff Size: Normal)   Pulse (!) 103   Ht 5\' 4"  (1.626 m)   Wt 179 lb (81.2 kg)   LMP  (LMP Unknown)   BMI 30.73 kg/m  UPT is negative Skin warm and dry.  Lungs: clear to ausculation bilaterally. Cardiovascular: regular rate and rhythm.      Upstream - 09/13/23 1411       Pregnancy Intention Screening   Does the patient want to become pregnant in the next year? No    Does the patient's partner want to become pregnant in the next year? No    Would the patient like to discuss contraceptive options today? Yes      Contraception Wrap Up   Current Method Abstinence    End Method Abstinence    Contraception Counseling Provided Yes             Assessment:     1. Pregnancy examination or test, negative result - POCT urine pregnancy  2. Amenorrhea, secondary (Primary) No period in 34 years, was on depo til last year, none since  then either UPT is  negative FSH was 9.5 and Prolactin  was 6.4 on 09/06/23 Will rx provera  10 mg 1 daily for 10 days to see if gets withdrawal bleed  Meds ordered this encounter  Medications   medroxyPROGESTERone  (PROVERA ) 10 MG tablet    Sig: Take 1 daily for 10 days    Dispense:  10 tablet    Refill:  0    Supervising Provider:   Evalyn Hillier H [[id number]]   US  was 09/08/23: IMPRESSION: 1. Fluid is noted in the endometrial cavity measuring approximately 2.3 x 4.1 x 2.5 cm. This correlates to the CT finding. No solid mass is identified in the endometrial cavity. 2. No acute abnormality identified. Follow  up with me in 3 weeks to see if bleeds  3. Pelvic pain Pelvic pain has resolved  4. ASCUS with positive high risk HPV cervical Will get Colpo, with Dr Randolm Butte,  had cervical conization in 2022   I don't think she has had pap since then til now. Plan:     Return ASAP for colpo with Dr Randolm Butte Follow up with me in 3 weeks to see if get withdrawal bleed and talk more about birth control

## 2023-09-20 ENCOUNTER — Encounter: Admitting: Obstetrics & Gynecology

## 2023-10-12 ENCOUNTER — Ambulatory Visit: Admitting: Obstetrics & Gynecology

## 2023-10-12 ENCOUNTER — Encounter: Payer: Self-pay | Admitting: Obstetrics & Gynecology

## 2023-10-12 VITALS — BP 134/81 | HR 90 | Ht 64.0 in | Wt 179.0 lb

## 2023-10-12 DIAGNOSIS — R87610 Atypical squamous cells of undetermined significance on cytologic smear of cervix (ASC-US): Secondary | ICD-10-CM | POA: Diagnosis not present

## 2023-10-12 DIAGNOSIS — R87810 Cervical high risk human papillomavirus (HPV) DNA test positive: Secondary | ICD-10-CM

## 2023-10-12 NOTE — Progress Notes (Signed)
.     Colposcopy Procedure Note:    Colposcopy Procedure Note  Indications:  2025 ASCUS + HR HPV(negative 16 +18/45)   2019 ASCCP recommendation:  Smoker:  No. New sexual partner:  No.    History of abnormal Pap: yes  Procedure Details  The risks and benefits of the procedure and Written informed consent obtained.  Speculum placed in vagina and excellent visualization of cervix achieved, cervix swabbed x 3 with acetic acid  solution.  Findings: Adequate colposcopy is noted today.  Cervix: no visible lesions, no mosaicism, no punctation, and no abnormal vasculature; SCJ visualized 360 degrees without lesions and no biopsies taken. Vaginal inspection: normal without visible lesions. Vulvar colposcopy: vulvar colposcopy not performed.  Specimens: none  Complications: none.  Colposcopic Impression: Normal colposcopy  Plan(Based on 2019 ASCCP recommendations) Repeat HPV based cytology 1 year

## 2023-10-16 ENCOUNTER — Ambulatory Visit: Admitting: Adult Health

## 2023-10-30 ENCOUNTER — Telehealth: Payer: Self-pay | Admitting: Gastroenterology

## 2023-10-30 NOTE — Telephone Encounter (Signed)
 Patient left a message that she was here in April 2025 and the pain and bleeding from rectum has started again.  She mentioned being scheduled for a colonoscopy.  She said this has been going on for 3-4 days.  Please advise.

## 2023-10-30 NOTE — Telephone Encounter (Signed)
 Pt needs to come back in the office to be re-evaluated and discuss possible colonoscopy

## 2023-10-31 ENCOUNTER — Other Ambulatory Visit: Payer: Self-pay | Admitting: Family Medicine

## 2023-10-31 DIAGNOSIS — I10 Essential (primary) hypertension: Secondary | ICD-10-CM

## 2023-11-03 ENCOUNTER — Encounter: Payer: Self-pay | Admitting: Gastroenterology

## 2023-11-03 ENCOUNTER — Telehealth: Payer: Self-pay | Admitting: *Deleted

## 2023-11-03 ENCOUNTER — Other Ambulatory Visit (HOSPITAL_COMMUNITY)
Admission: RE | Admit: 2023-11-03 | Discharge: 2023-11-03 | Disposition: A | Discharge status: Home / Self Care | Source: Ambulatory Visit | Attending: Gastroenterology | Admitting: Gastroenterology

## 2023-11-03 ENCOUNTER — Ambulatory Visit: Payer: Self-pay | Admitting: Gastroenterology

## 2023-11-03 ENCOUNTER — Ambulatory Visit (HOSPITAL_COMMUNITY)
Admission: RE | Admit: 2023-11-03 | Discharge: 2023-11-03 | Disposition: A | Discharge status: Home / Self Care | Source: Ambulatory Visit | Attending: Gastroenterology | Admitting: Gastroenterology

## 2023-11-03 ENCOUNTER — Ambulatory Visit: Admitting: Gastroenterology

## 2023-11-03 VITALS — BP 129/86 | HR 76 | Temp 98.3°F | Ht 64.0 in | Wt 171.8 lb

## 2023-11-03 DIAGNOSIS — R197 Diarrhea, unspecified: Secondary | ICD-10-CM

## 2023-11-03 DIAGNOSIS — R1030 Lower abdominal pain, unspecified: Secondary | ICD-10-CM | POA: Insufficient documentation

## 2023-11-03 DIAGNOSIS — K625 Hemorrhage of anus and rectum: Secondary | ICD-10-CM | POA: Diagnosis not present

## 2023-11-03 DIAGNOSIS — K649 Unspecified hemorrhoids: Secondary | ICD-10-CM | POA: Diagnosis not present

## 2023-11-03 LAB — COMPREHENSIVE METABOLIC PANEL WITH GFR
ALT: 18 U/L (ref 0–44)
AST: 15 U/L (ref 15–41)
Albumin: 3.7 g/dL (ref 3.5–5.0)
Alkaline Phosphatase: 74 U/L (ref 38–126)
Anion gap: 9 (ref 5–15)
BUN: 11 mg/dL (ref 6–20)
CO2: 24 mmol/L (ref 22–32)
Calcium: 9.2 mg/dL (ref 8.9–10.3)
Chloride: 104 mmol/L (ref 98–111)
Creatinine, Ser: 0.76 mg/dL (ref 0.44–1.00)
GFR, Estimated: >60 mL/min (ref >60)
Glucose, Bld: 97 mg/dL (ref 70–99)
Potassium: 3.8 mmol/L (ref 3.5–5.1)
Sodium: 137 mmol/L (ref 135–145)
Total Bilirubin: 0.7 mg/dL (ref 0.0–1.2)
Total Protein: 6.8 g/dL (ref 6.5–8.1)

## 2023-11-03 LAB — CBC WITH DIFFERENTIAL/PLATELET
Abs Immature Granulocytes: 0.01 10*3/uL (ref 0.00–0.07)
Basophils Absolute: 0.1 10*3/uL (ref 0.0–0.1)
Basophils Relative: 1 %
Eosinophils Absolute: 0.1 10*3/uL (ref 0.0–0.5)
Eosinophils Relative: 1 %
HCT: 40.4 % (ref 36.0–46.0)
Hemoglobin: 13.3 g/dL (ref 12.0–15.0)
Immature Granulocytes: 0 %
Lymphocytes Relative: 32 %
Lymphs Abs: 2 10*3/uL (ref 0.7–4.0)
MCH: 28.3 pg (ref 26.0–34.0)
MCHC: 32.9 g/dL (ref 30.0–36.0)
MCV: 86 fL (ref 80.0–100.0)
Monocytes Absolute: 0.5 10*3/uL (ref 0.1–1.0)
Monocytes Relative: 8 %
Neutro Abs: 3.6 10*3/uL (ref 1.7–7.7)
Neutrophils Relative %: 58 %
Platelets: 392 10*3/uL (ref 150–400)
RBC: 4.7 MIL/uL (ref 3.87–5.11)
RDW: 12.8 % (ref 11.5–15.5)
WBC: 6.1 10*3/uL (ref 4.0–10.5)
nRBC: 0 % (ref 0.0–0.2)

## 2023-11-03 LAB — SEDIMENTATION RATE: Sed Rate: 9 mm/h (ref 0–20)

## 2023-11-03 LAB — C-REACTIVE PROTEIN: CRP: 0.5 mg/dL (ref <1.0)

## 2023-11-03 LAB — LIPASE, BLOOD: Lipase: 23 U/L (ref 11–51)

## 2023-11-03 LAB — HCG, QUANTITATIVE, PREGNANCY: hCG, Beta Chain, Quant, S: <1 m[IU]/mL (ref <5)

## 2023-11-03 MED ORDER — IOHEXOL 300 MG/ML  SOLN
100.0000 mL | Freq: Once | INTRAMUSCULAR | Status: AC | PRN
  Administered 2023-11-03: 100 mL via INTRAVENOUS

## 2023-11-03 MED ORDER — DICYCLOMINE HCL 10 MG PO CAPS
10.0000 mg | ORAL_CAPSULE | Freq: Three times a day (TID) | ORAL | 0 refills | Status: AC
Start: 2023-11-03 — End: ?

## 2023-11-03 NOTE — Telephone Encounter (Signed)
 Carelon PA: Order ID: [id number]       Authorized  Approval Valid Through: 11/03/2023 - 01/01/2024

## 2023-11-03 NOTE — Patient Instructions (Signed)
 Please go straight to [facility] for labs.   CT scan as scheduled.  We will be in touch with results by Mychart. Please be looking for message from me as it will be after hours when they come back. If CT ok, we will plan for colonoscopy.  I have sent in dicyclomine  to help with pain. If you have constipation, please stop dicyclomine  until you move your bowels.
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
## 2023-11-06 ENCOUNTER — Encounter: Payer: Self-pay | Admitting: *Deleted

## 2023-11-06 ENCOUNTER — Other Ambulatory Visit: Payer: Self-pay | Admitting: *Deleted

## 2023-11-06 DIAGNOSIS — R102 Pelvic and perineal pain: Secondary | ICD-10-CM

## 2023-11-06 DIAGNOSIS — R9389 Abnormal findings on diagnostic imaging of other specified body structures: Secondary | ICD-10-CM

## 2023-11-06 DIAGNOSIS — K625 Hemorrhage of anus and rectum: Secondary | ICD-10-CM

## 2023-11-06 DIAGNOSIS — R1084 Generalized abdominal pain: Secondary | ICD-10-CM

## 2023-11-06 DIAGNOSIS — R197 Diarrhea, unspecified: Secondary | ICD-10-CM

## 2023-11-06 DIAGNOSIS — R1030 Lower abdominal pain, unspecified: Secondary | ICD-10-CM

## 2023-11-06 MED ORDER — PEG 3350-KCL-NA BICARB-NACL 420 G PO SOLR: 4000.0000 mL | Freq: Once | ORAL | 0 refills | Status: AC

## 2023-11-08 ENCOUNTER — Ambulatory Visit: Admitting: Adult Health

## 2023-11-08 ENCOUNTER — Encounter: Payer: Self-pay | Admitting: Adult Health

## 2023-11-08 VITALS — BP 125/76 | HR 65 | Ht 63.0 in | Wt 175.0 lb

## 2023-11-08 DIAGNOSIS — N857 Hematometra: Secondary | ICD-10-CM | POA: Diagnosis not present

## 2023-11-08 DIAGNOSIS — R102 Pelvic and perineal pain: Secondary | ICD-10-CM

## 2023-11-08 NOTE — Progress Notes (Signed)
  Subjective:     Patient ID: Rebecca [name], female   DOB: February 10, 1991, 33 y.o.   MRN: [id number]  HPI Rebecca [name] is a 33 year old black female,single, [phone] back in with pelvic pain, has seen GI, for ?rectal bleeding  and having colonoscopy in July, she had CT 11/03/23 that showed increasing fluid in endometrium,?hematometra, with possible obstruction.     Component Value Date/Time   DIAGPAP (A) 09/06/2023 1145    - Atypical squamous cells of undetermined significance (ASC-US )   DIAGPAP  08/16/2017 0000    NEGATIVE FOR INTRAEPITHELIAL LESIONS OR MALIGNANCY.   HPVHIGH Positive (A) 09/06/2023 1145   ADEQPAP  09/06/2023 1145    Satisfactory for evaluation; transformation zone component ABSENT.   ADEQPAP  08/16/2017 0000    Satisfactory for evaluation  endocervical/transformation zone component PRESENT.   Colpo 10/12/23 negative,repeat in 1 year  Review of Systems +pelvic pain Period blood was dark Reviewed past medical,surgical, social and family history. Reviewed medications and allergies.     Objective:   Physical Exam BP 125/76 (BP Location: Left Arm, Patient Position: Sitting, Cuff Size: Normal)   Pulse 65   Ht 5' 3 (1.6 m)   Wt 175 lb (79.4 kg)   LMP 11/06/2023   BMI 31.00 kg/m     Skin warm and dry. Lungs: clear to ausculation bilaterally. Cardiovascular: regular rate and rhythm.  Fall risk is low   Upstream - 11/08/23 1042       Pregnancy Intention Screening   Does the patient want to become pregnant in the next year? No    Does the patient's partner want to become pregnant in the next year? No    Would the patient like to discuss contraceptive options today? No      Contraception Wrap Up   Current Method Abstinence    End Method Abstinence    Contraception Counseling Provided No          Assessment:     1. Pelvic pain (Primary) +pelvic Pain   2. Hematometra Seen on CT 11/03/23    Plan:     Return in am to see Dr Ozan to release hematometra

## 2023-11-09 ENCOUNTER — Encounter: Payer: Self-pay | Admitting: Obstetrics & Gynecology

## 2023-11-09 ENCOUNTER — Ambulatory Visit (INDEPENDENT_AMBULATORY_CARE_PROVIDER_SITE_OTHER): Admitting: Obstetrics & Gynecology

## 2023-11-09 VITALS — BP 123/74 | HR 77 | Ht 63.0 in | Wt 177.8 lb

## 2023-11-09 DIAGNOSIS — R102 Pelvic and perineal pain: Secondary | ICD-10-CM | POA: Diagnosis not present

## 2023-11-09 DIAGNOSIS — N882 Stricture and stenosis of cervix uteri: Secondary | ICD-10-CM

## 2023-11-09 DIAGNOSIS — N857 Hematometra: Secondary | ICD-10-CM
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
## 2023-11-14 ENCOUNTER — Ambulatory Visit (HOSPITAL_COMMUNITY): Admission: RE | Admit: 2023-11-14 | Source: Home / Self Care | Admitting: Internal Medicine

## 2023-11-14 ENCOUNTER — Encounter (HOSPITAL_COMMUNITY): Admission: RE | Payer: Self-pay | Source: Home / Self Care

## 2023-11-14 ENCOUNTER — Telehealth: Payer: Self-pay | Admitting: *Deleted

## 2023-11-14 SURGERY — COLONOSCOPY
Anesthesia: Choice

## 2023-11-14 NOTE — Telephone Encounter (Signed)
 Not sure what patient reported but I have seen her 3 times in the past six months. She no showed for colonoscopy in 05/2023 citing lack of childcare.   I saw her urgently on 11/03/23 for abdominal pain and passing blood per rectum. I set her up for stat labs and stat CT, completed and resulted to her same day. Colonoscopy set up for 11/14/23 (for passing blood per rectum). Referred her back to gyn for CT findings suggestive of hematometrea. Seen by gyn 11/09/23. Patient requested us  write out of work from the day of ov until her colonoscopy, declined as I did not feel it was appropriate.   I am not sure what more we could have done.

## 2023-11-14 NOTE — Telephone Encounter (Signed)
 Noted. RICK Lamp if patient calls in to transfer care

## 2023-11-14 NOTE — Telephone Encounter (Signed)
 Per Anitra in endo [facility]yl Marseille [birth year] [phone] did not show for procedure and does not want to reschedule . and further more wants to find care somewhere else she said she was tired of the back and forth and that we dont want to help her . She's taken a lot of time off and still has not been seen  Procedure was cancelled. FYI to leslie. Dr. Cindie also aware.

## 2023-11-26 ENCOUNTER — Encounter: Payer: Self-pay | Admitting: Obstetrics & Gynecology

## 2023-11-27 ENCOUNTER — Ambulatory Visit: Admitting: Gastroenterology

## 2023-12-13 ENCOUNTER — Encounter: Payer: Self-pay | Admitting: Allergy & Immunology

## 2023-12-13 ENCOUNTER — Ambulatory Visit: Admitting: Allergy & Immunology

## 2023-12-13 VITALS — BP 138/84 | Temp 98.1°F | Resp 16 | Wt 183.0 lb

## 2023-12-13 DIAGNOSIS — J302 Other seasonal allergic rhinitis: Secondary | ICD-10-CM

## 2023-12-13 DIAGNOSIS — R221 Localized swelling, mass and lump, neck: Secondary | ICD-10-CM

## 2023-12-13 DIAGNOSIS — J4540 Moderate persistent asthma, uncomplicated: Secondary | ICD-10-CM | POA: Diagnosis not present

## 2023-12-13 DIAGNOSIS — R220 Localized swelling, mass and lump, head: Secondary | ICD-10-CM

## 2023-12-13 DIAGNOSIS — K219 Gastro-esophageal reflux disease without esophagitis: Secondary | ICD-10-CM | POA: Diagnosis not present

## 2023-12-13 DIAGNOSIS — J3089 Other allergic rhinitis: Secondary | ICD-10-CM

## 2023-12-13 DIAGNOSIS — L299 Pruritus, unspecified: Secondary | ICD-10-CM

## 2023-12-13 NOTE — Patient Instructions (Addendum)
 1. Moderate persistent asthma, uncomplicated - Your breathing test looks amazing today!  - We are going to not make any changes at all especially since you notice problems when you stop the Advair.  - Daily controller medication(s): Singulair  10mg  daily and Advair 250/44mcg one puff twice daily - Prior to physical activity: albuterol  2 puffs 10-15 minutes before physical activity. - Rescue medications: albuterol  4 puffs every 4-6 hours as needed - Asthma control goals:  * Full participation in all desired activities (may need albuterol  before activity) * Albuterol  use two time or less a week on average (not counting use with activity) * Cough interfering with sleep two time or less a month * Oral steroids no more than once a year * No hospitalizations  2. Allergic rhinitis (grasses, ragweed, trees, indoor molds, outdoor molds, dust mites, cat, dog, and cockroach) - Testing on 05/05/23 was positive to: grasses, ragweed, trees, indoor molds, outdoor molds, dust mites, cat, dog, and cockroach  - These allergens are likely triggering your breathing problems as well.  - Continue avoidance measures. - Continue: Zyrtec  (cetirizine ) 10mg  tablet once daily, Singulair  (montelukast ) 10mg  daily - Continue Astelin  (azelastine ) 2 sprays per nostril 1-2 times daily as needed - You can use an extra dose of the antihistamine, if needed, for breakthrough symptoms.  - Consider nasal saline rinses 1-2 times daily to remove allergens from the nasal cavities as well as help with mucous clearance (this is especially helpful to do before the nasal sprays are given) - Consider allergy  shots as a means of long-term control. - Allergy  shots re-train and reset the immune system to ignore environmental allergens and decrease the resulting immune response to those allergens (sneezing, itchy watery eyes, runny nose, nasal congestion, etc).    - Allergy  shots improve symptoms in 75-85% of patients.  - Allergy  shots cure  allergies and can help a lot in the long term.   3. Reflux - Continue with pantoprazole  40 mg once a day. - Continue with the dietary and lifestyle modifications as below.  4. Concern for food allergy  - We will get testing to look for nut and chocolate allergies. - We will call you in 1-2 weeks with the results of the testing.  - Hopefully these are all negative.   5. Return in about 6 months (around 06/14/2024). You can have the follow up appointment with Dr. Iva or a Nurse Practicioner (our Nurse Practitioners are excellent and always have Physician oversight!).    Please inform us  of any Emergency Department visits, hospitalizations, or changes in symptoms. Call us  before going to the ED for breathing or allergy  symptoms since we might be able to fit you in for a sick visit. Feel free to contact us  anytime with any questions, problems, or concerns.  It was a pleasure to see you and your family again today!  Websites that have reliable patient information: 1. American Academy of Asthma, Allergy , and Immunology: www.aaaai.org 2. Food Allergy  Research and Education (FARE): foodallergy.org 3. Mothers of Asthmatics: http://www.asthmacommunitynetwork.org 4. [employer] of Allergy , Asthma, and Immunology: www.acaai.org      "Like" us  on Facebook and Instagram for our latest updates!      A healthy democracy works best when [employer] participate! Make sure you are registered to vote! If you have moved or changed any of your contact information, you will need to get this updated before voting! Scan the QR codes below to learn more!  Lifestyle Changes for Controlling GERD  When you have GERD, stomach acid feels as if it's backing up toward your mouth. Whether or not you take medication to control your GERD, your symptoms can often be improved with lifestyle changes.   Raise Your Head Reflux is more likely to strike when you're lying down flat, because  stomach fluid can flow backward more easily. Raising the head of your bed 4-6 inches can help. To do this: Slide blocks or books under the legs at the head of your bed. Or, place a wedge under the mattress. Many foam stores can make a suitable wedge for you. The wedge should run from your waist to the top of your head. Don't just prop your head on several pillows. This increases pressure on your stomach. It can make GERD worse.  Watch Your Eating Habits Certain foods may increase the acid in your stomach or relax the lower esophageal sphincter, making GERD more likely. It's best to avoid the following: Coffee, tea, and carbonated drinks (with and without caffeine) Fatty, fried, or spicy food Mint, chocolate, onions, and tomatoes Any other foods that seem to irritate your stomach or cause you pain  Relieve the Pressure Eat smaller meals, even if you have to eat more often. Don't lie down right after you eat. Wait a few hours for your stomach to empty. Avoid tight belts and tight-fitting clothes. Lose excess weight.  Tobacco and Alcohol Avoid smoking tobacco and drinking alcohol. They can make GERD symptoms worse.  Allergy  Shots  Allergies are the result of a chain reaction that starts in the immune system. Your immune system controls how your body defends itself. For instance, if you have an allergy  to pollen, your immune system identifies pollen as an invader or allergen. Your immune system overreacts by producing antibodies called Immunoglobulin E (IgE). These antibodies travel to cells that release chemicals, causing an allergic reaction.  The concept behind allergy  immunotherapy, whether it is received in the form of shots or tablets, is that the immune system can be desensitized to specific allergens that trigger allergy  symptoms. Although it requires time and patience, the payback can be long-term relief. Allergy  injections contain a dilute solution of those substances that you are  allergic to based upon your skin testing and allergy  history.   How Do Allergy  Shots Work?  Allergy  shots work much like a vaccine. Your body responds to injected amounts of a particular allergen given in increasing doses, eventually developing a resistance and tolerance to it. Allergy  shots can lead to decreased, minimal or no allergy  symptoms.  There generally are two phases: build-up and maintenance. Build-up often ranges from three to six months and involves receiving injections with increasing amounts of the allergens. The shots are typically given once or twice a week, though more rapid build-up schedules are sometimes used.  The maintenance phase begins when the most effective dose is reached. This dose is different for each person, depending on how allergic you are and your response to the build-up injections. Once the maintenance dose is reached, there are longer periods between injections, typically two to four weeks.  Occasionally doctors give cortisone-type shots that can temporarily reduce allergy  symptoms. These types of shots are different and should not be confused with allergy  immunotherapy shots.  Who Can Be Treated with Allergy  Shots?  Allergy  shots may be a good treatment approach for people with allergic rhinitis (hay fever), allergic asthma, conjunctivitis (eye allergy ) or stinging insect allergy .   Before deciding  to begin allergy  shots, you should consider:   The length of allergy  season and the severity of your symptoms  Whether medications and/or changes to your environment can control your symptoms  Your desire to avoid long-term medication use  Time: allergy  immunotherapy requires a major time commitment  Cost: may vary depending on your insurance coverage  Allergy  shots for children age 39 and older are effective and often well tolerated. They might prevent the onset of new allergen sensitivities or the progression to asthma.  Allergy  shots are not started on  patients who are pregnant but can be continued on patients who become pregnant while receiving them. In some patients with other medical conditions or who take certain common medications, allergy  shots may be of risk. It is important to mention other medications you talk to your allergist.   What are the two types of build-ups offered:   RUSH or Rapid Desensitization -- one day of injections lasting from 8:30-4:30pm, injections every 1 hour.  Approximately half of the build-up process is completed in that one day.  The following week, normal build-up is resumed, and this entails ~16 visits either weekly or twice weekly, until reaching your "maintenance dose" which is continued weekly until eventually getting spaced out to every month for a duration of 3 to 5 years. The regular build-up appointments are nurse visits where the injections are administered, followed by required monitoring for 30 minutes.    Traditional build-up -- weekly visits for 6 -12 months until reaching "maintenance dose", then continue weekly until eventually spacing out to every 4 weeks as above. At these appointments, the injections are administered, followed by required monitoring for 30 minutes.     Either way is acceptable, and both are equally effective. With the rush protocol, the advantage is that less time is spent here for injections overall AND you would also reach maintenance dosing faster (which is when the clinical benefit starts to become more apparent). Not everyone is a candidate for rapid desensitization.   IF we proceed with the RUSH protocol, there are premedications which must be taken the day before and the day after the rush only (this includes antihistamines, steroids, and Singulair ).  After the rush day, no prednisone  or Singulair  is required, and we just recommend antihistamines taken on your injection day.  What Is An Estimate of the Costs?  If you are interested in starting allergy  injections, please  check with your insurance company about your coverage for both allergy  vial sets and allergy  injections.  Please do so prior to making the appointment to start injections.  The following are CPT codes to give to your insurance company. These are the amounts we BILL to the insurance company, but the amount YOU WILL PAY and WE RECEIVE IS SUBSTANTIALLY LESS and depends on the contracts we have with different insurance companies.   Amount Billed to Insurance Two allergy  vial set  CPT 95165   $ 2400     Two injections   CPT 95117   $ 40 RUSH (Rapid Desensitization) CPT 95180 x 8 hours  $500/hour  Regarding the allergy  injections, your co-pay may or may not apply with each injection, so please confirm this with your insurance company. When you start allergy  injections, 1 or 2 sets of vials are made based on your allergies.  Not all patients can be on one set of vials. A set of vials lasts 6 months to a year depending on how quickly you can proceed with your  build-up of your allergy  injections. Vials are personalized for each patient depending on their specific allergens.  How often are allergy  injection given during the build-up period?   Injections are given at least weekly during the build-up period until your maintenance dose is achieved. Per the doctor's discretion, you may have the option of getting allergy  injections two times per week during the build-up period. However, there must be at least 48 hours between injections. The build-up period is usually completed within 6-12 months depending on your ability to schedule injections and for adjustments for reactions. When maintenance dose is reached, your injection schedule is gradually changed to every two weeks and later to every three weeks. Injections will then continue every 4 weeks. Usually, injections are continued for a total of 3-5 years.   When Will I Feel Better?  Some may experience decreased allergy  symptoms during the build-up phase. For  others, it may take as long as 12 months on the maintenance dose. If there is no improvement after a year of maintenance, your allergist will discuss other treatment options with you.  If you aren't responding to allergy  shots, it may be because there is not enough dose of the allergen in your vaccine or there are missing allergens that were not identified during your allergy  testing. Other reasons could be that there are high levels of the allergen in your environment or major exposure to non-allergic triggers like tobacco smoke.  What Is the Length of Treatment?  Once the maintenance dose is reached, allergy  shots are generally continued for three to five years. The decision to stop should be discussed with your allergist at that time. Some people may experience a permanent reduction of allergy  symptoms. Others may relapse and a longer course of allergy  shots can be considered.  What Are the Possible Reactions?  The two types of adverse reactions that can occur with allergy  shots are local and systemic. Common local reactions include very mild redness and swelling at the injection site, which can happen immediately or several hours after. Report a delayed reaction from your last injection. These include arm swelling or runny nose, watery eyes or cough that occurs within 12-24 hours after injection. A systemic reaction, which is less common, affects the entire body or a particular body system. They are usually mild and typically respond quickly to medications. Signs include increased allergy  symptoms such as sneezing, a stuffy nose or hives.   Rarely, a serious systemic reaction called anaphylaxis can develop. Symptoms include swelling in the throat, wheezing, a feeling of tightness in the chest, nausea or dizziness. Most serious systemic reactions develop within 30 minutes of allergy  shots. This is why it is strongly recommended you wait in your doctor's office for 30 minutes after your injections.  Your allergist is trained to watch for reactions, and his or her staff is trained and equipped with the proper medications to identify and treat them.   Report to the nurse immediately if you experience any of the following symptoms: swelling, itching or redness of the skin, hives, watery eyes/nose, breathing difficulty, excessive sneezing, coughing, stomach pain, diarrhea, or light headedness. These symptoms may occur within 15-20 minutes after injection and may require medication.   Who Should Administer Allergy  Shots?  The preferred location for receiving shots is your prescribing allergist's office. Injections can sometimes be given at another facility where the physician and staff are trained to recognize and treat reactions, and have received instructions by your prescribing allergist.  What if  I am late for an injection?   Injection dose will be adjusted depending upon how many days or weeks you are late for your injection.   What if I am sick?   Please report any illness to the nurse before receiving injections. She may adjust your dose or postpone injections depending on your symptoms. If you have fever, flu, sinus infection or chest congestion it is best to postpone allergy  injections until you are better. Never get an allergy  injection if your asthma is causing you problems. If your symptoms persist, seek out medical care to get your health problem under control.  What If I am or Become Pregnant:  Women that become pregnant should schedule an appointment with [facility] before receiving any further allergy  injections.

## 2023-12-13 NOTE — Progress Notes (Unsigned)
 FOLLOW UP  Date of Service/Encounter:  12/13/23   Assessment:   Chronic cough   Perennial and seasonal allergic rhinitis (grasses, ragweed, trees, indoor molds, outdoor molds, dust mites, cat, dog, and cockroach)  Concern for food allergy   GERD - currently on PPI with good results   Plan/Recommendations:   1. Moderate persistent asthma, uncomplicated - Your breathing test looks amazing today!  - We are going to not make any changes at all especially since you notice problems when you stop the Advair.  - Daily controller medication(s): Singulair  10mg  daily and Advair 250/11mcg one puff twice daily - Prior to physical activity: albuterol  2 puffs 10-15 minutes before physical activity. - Rescue medications: albuterol  4 puffs every 4-6 hours as needed - Asthma control goals:  * Full participation in all desired activities (may need albuterol  before activity) * Albuterol  use two time or less a week on average (not counting use with activity) * Cough interfering with sleep two time or less a month * Oral steroids no more than once a year * No hospitalizations  2. Allergic rhinitis (grasses, ragweed, trees, indoor molds, outdoor molds, dust mites, cat, dog, and cockroach) - Testing on 05/05/23 was positive to: grasses, ragweed, trees, indoor molds, outdoor molds, dust mites, cat, dog, and cockroach  - These allergens are likely triggering your breathing problems as well.  - Continue avoidance measures. - Continue: Zyrtec  (cetirizine ) 10mg  tablet once daily, Singulair  (montelukast ) 10mg  daily - Continue Astelin  (azelastine ) 2 sprays per nostril 1-2 times daily as needed - You can use an extra dose of the antihistamine, if needed, for breakthrough symptoms.  - Consider nasal saline rinses 1-2 times daily to remove allergens from the nasal cavities as well as help with mucous clearance (this is especially helpful to do before the nasal sprays are given) - Consider allergy  shots as a  means of long-term control. - Allergy  shots re-train and reset the immune system to ignore environmental allergens and decrease the resulting immune response to those allergens (sneezing, itchy watery eyes, runny nose, nasal congestion, etc).    - Allergy  shots improve symptoms in 75-85% of patients.  - Allergy  shots cure allergies and can help a lot in the long term.   3. Reflux - Continue with pantoprazole  40 mg once a day. - Continue with the dietary and lifestyle modifications as below.  4. Concern for food allergy  - We will get testing to look for nut and chocolate allergies. - We will call you in 1-2 weeks with the results of the testing.  - Hopefully these are all negative.   5. Return in about 6 months (around 06/14/2024). You can have the follow up appointment with Dr. Iva or a Nurse Practicioner (our Nurse Practitioners are excellent and always have Physician oversight!).    Subjective:   Rebecca [name] is a 33 y.o. female presenting today for follow up of  Chief Complaint  Patient presents with   Follow-up    Rebecca [name] has a history of the following: Patient Active Problem List   Diagnosis Date Noted   Hematometra 11/08/2023   ASCUS with positive high risk HPV cervical 09/13/2023   Pregnancy examination or test, negative result 09/13/2023   Amenorrhea, secondary 09/06/2023   Pelvic pain 09/06/2023   Routine Papanicolaou smear 09/06/2023   Thickened endometrium 09/06/2023   Migraine without aura, not intractable, without status migrainosus 05/02/2023   Diarrhea 04/11/2023   Abdominal pain 04/11/2023   Hypertension 03/30/2023   Rectal  bleeding 03/30/2023   Uses contraception 03/30/2023   Encounter for immunization 03/30/2023   High grade squamous intraepithelial cervical dysplasia    History of gestational hypertension 03/03/2018   History of abnormal cervical Pap smear 08/31/2017   History of chlamydia 08/22/2017   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  Discussed the use of AI scribe software for clinical note transcription with the patient and/or guardian, who gave verbal consent to proceed.  Rebecca [name] is a 33 y.o. female presenting for a follow up visit.  He was last seen in April 2025.  At that time, breathing test was great.  We continue with Singulair  10 mg daily and Advair 250/50 micrograms 1 puff twice daily.  For her allergic rhinitis, we continue with Zyrtec  as well as Singulair  and Astelin .  Reflux was controlled with pantoprazole  40 mg once daily.  Since last visit, she has done relatively well.  Asthma/Respiratory Symptom History: She experiences shortness of breath, particularly when walking outside, with heat exacerbating her symptoms. She uses Advair daily and albuterol  as needed, especially before walking. Missing doses of her inhaler leads to dizziness and increased breathing difficulties.  Allergic Rhinitis Symptom History: She takes Singulair  at night to help with her symptoms. Her sleep is disturbed due to heat and stuffiness, contributing to nighttime coughing. She has a history of positive allergy  testing to multiple allergens and has previously been on Claritin, Flonase , Zyrtec , and Ryaltris. She stopped consuming almonds after testing showed slight reactivity.  Food Allergy  Symptom History: She has allergies to multiple substances, which are difficult to manage. Recently, she had a severe allergic reaction after consuming chocolate, resulting in itching, throat discomfort, and a rash on her back. She took Benadryl  and hydrocortisone  cream to alleviate the symptoms. She is unsure about the specific allergens but avoids peanuts and chocolate.  GERD Symptom History: Her GERD is under good control without medications. She was previously taking a PPI but is no longer doing that.   Infection Symptom History: No recent sinus infections. Facial breakouts are attributed  to a pimple rather than an infection. She experiences hoarseness in the mornings and has a dry throat when the temperature drops at night. No cat exposure.   Otherwise, there have been no changes to her past medical history, surgical history, family history, or social history.    Review of systems otherwise negative other than that mentioned in the HPI.    Objective:   Blood pressure 138/84, temperature 98.1 F (36.7 C), resp. rate 16, weight 183 lb (83 kg). Body mass index is 32.42 kg/m.    Physical Exam Vitals reviewed.  Constitutional:      Appearance: She is well-developed.     Comments: Pleasant. Talkative.   HENT:     Head: Normocephalic and atraumatic.     Right Ear: Tympanic membrane, ear canal and external ear normal. No drainage, swelling or tenderness. Tympanic membrane is not injected, scarred, erythematous, retracted or bulging.     Left Ear: Tympanic membrane, ear canal and external ear normal. No drainage, swelling or tenderness. Tympanic membrane is not injected, scarred, erythematous, retracted or bulging.     Nose: No nasal deformity, septal deviation, mucosal edema or rhinorrhea.     Right Turbinates: Enlarged, swollen and pale.     Left Turbinates: Enlarged, swollen and pale.     Right Sinus: No maxillary sinus tenderness or frontal sinus tenderness.     Left Sinus: No maxillary sinus tenderness  or frontal sinus tenderness.     Comments: No polyps noted.     Mouth/Throat:     Mouth: Mucous membranes are not pale and not dry.     Pharynx: Uvula midline.  Eyes:     General:        Right eye: No discharge.        Left eye: No discharge.     Conjunctiva/sclera: Conjunctivae normal.     Right eye: Right conjunctiva is not injected. No chemosis.    Left eye: Left conjunctiva is not injected. No chemosis.    Pupils: Pupils are equal, round, and reactive to light.  Cardiovascular:     Rate and Rhythm: Normal rate and regular rhythm.     Heart sounds: Normal  heart sounds.  Pulmonary:     Effort: Pulmonary effort is normal. No tachypnea, accessory muscle usage or respiratory distress.     Breath sounds: Normal breath sounds. No wheezing, rhonchi or rales.     Comments: Excellent air movement throughout.  Chest:     Chest wall: No tenderness.  Abdominal:     Tenderness: There is no abdominal tenderness. There is no guarding or rebound.  Lymphadenopathy:     Head:     Right side of head: No submandibular, tonsillar or occipital adenopathy.     Left side of head: No submandibular, tonsillar or occipital adenopathy.     Cervical: No cervical adenopathy.  Skin:    Coloration: Skin is not pale.     Findings: No abrasion, erythema, petechiae or rash. Rash is not papular, urticarial or vesicular.  Neurological:     Mental Status: She is alert.  Psychiatric:        Behavior: Behavior is cooperative.      Diagnostic studies:    Spirometry: results normal (FEV1: 2.73/101%, FVC: 3.63/112%, FEV1/FVC: 75%).    Spirometry consistent with normal pattern.    Allergy  Studies: none       Marty Shaggy, MD  [facility] 

## 2023-12-18 LAB — IGE NUT PROF. W/COMPONENT RFLX
F017-IgE Hazelnut (Filbert): <0.1 kU/L
F018-IgE Brazil Nut: <0.1 kU/L
F020-IgE Almond: <0.1 kU/L
F202-IgE Cashew Nut: <0.1 kU/L
F203-IgE Pistachio Nut: 0.11 kU/L — AB
F256-IgE Walnut: <0.1 kU/L
Macadamia Nut, IgE: <0.1 kU/L
Peanut, IgE: <0.1 kU/L
Pecan Nut IgE: <0.1 kU/L

## 2023-12-18 LAB — ALLERGEN CHOCOLATE: Chocolate/Cacao IgE: <0.1 kU/L

## 2023-12-21 ENCOUNTER — Encounter: Payer: Self-pay | Admitting: Obstetrics & Gynecology

## 2023-12-21 ENCOUNTER — Ambulatory Visit: Admitting: Obstetrics & Gynecology

## 2023-12-21 ENCOUNTER — Ambulatory Visit: Payer: Self-pay | Admitting: Allergy & Immunology

## 2023-12-21 VITALS — BP 137/80 | Ht 64.0 in | Wt 182.4 lb

## 2023-12-21 DIAGNOSIS — R102 Pelvic and perineal pain: Secondary | ICD-10-CM

## 2023-12-21 DIAGNOSIS — N857 Hematometra: Secondary | ICD-10-CM

## 2023-12-21 DIAGNOSIS — N882 Stricture and stenosis of cervix uteri: Secondary | ICD-10-CM | POA: Diagnosis not present

## 2023-12-21 MED ORDER — MISOPROSTOL 200 MCG PO TABS: ORAL_TABLET | ORAL | 0 refills | Status: DC

## 2023-12-21 MED ORDER — IBUPROFEN 600 MG PO TABS: 600.0000 mg | ORAL_TABLET | Freq: Four times a day (QID) | ORAL | 1 refills | Status: AC | PRN

## 2023-12-21 MED ORDER — MISOPROSTOL 200 MCG PO TABS: ORAL_TABLET | ORAL | 0 refills | Status: AC
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
## 2023-12-25 ENCOUNTER — Encounter: Payer: Self-pay | Admitting: Obstetrics & Gynecology

## 2024-01-01 ENCOUNTER — Ambulatory Visit: Admitting: Family Medicine

## 2024-01-01 ENCOUNTER — Encounter: Payer: Self-pay | Admitting: Family Medicine

## 2024-01-01 VITALS — BP 124/85 | HR 95 | Ht 64.0 in | Wt 179.0 lb

## 2024-01-01 DIAGNOSIS — F439 Reaction to severe stress, unspecified: Secondary | ICD-10-CM

## 2024-01-01 DIAGNOSIS — E7849 Other hyperlipidemia: Secondary | ICD-10-CM

## 2024-01-01 DIAGNOSIS — R7301 Impaired fasting glucose: Secondary | ICD-10-CM | POA: Diagnosis not present

## 2024-01-01 DIAGNOSIS — E038 Other specified hypothyroidism: Secondary | ICD-10-CM

## 2024-01-01 DIAGNOSIS — G43009 Migraine without aura, not intractable, without status migrainosus: Secondary | ICD-10-CM

## 2024-01-01 DIAGNOSIS — I10 Essential (primary) hypertension: Secondary | ICD-10-CM | POA: Diagnosis not present

## 2024-01-01 DIAGNOSIS — E559 Vitamin D deficiency, unspecified: Secondary | ICD-10-CM

## 2024-01-01 MED ORDER — SUMATRIPTAN SUCCINATE 25 MG PO TABS: 25.0000 mg | ORAL_TABLET | ORAL | 0 refills | Status: AC | PRN

## 2024-01-01 NOTE — Patient Instructions (Addendum)
 I appreciate the opportunity to provide care to you today!    Follow up:  5 months  Labs: please stop by the lab during the week to get your blood drawn (CBC, CMP, TSH, Lipid profile, HgA1c, Vit D)  For a Healthier YOU, I Recommend: Reducing your intake of sugar, sodium, carbohydrates, and saturated fats. Increasing your fiber intake by incorporating more whole grains, fruits, and vegetables into your meals. Setting healthy goals with a focus on lowering your consumption of carbs, sugar, and unhealthy fats. Adding variety to your diet by including a wide range of fruits and vegetables. Cutting back on soda and limiting processed foods as much as possible. Staying active: In addition to taking your weight loss medication, aim for at least 150 minutes of moderate-intensity physical activity each week for optimal results.    Please follow up if your symptoms worsen or fail to improve.   Referrals today- behavioral health for talk therapy     Please continue to a heart-healthy diet and increase your physical activities. Try to exercise for at least five days a week.    It was a pleasure to see you and I look forward to continuing to work together on your health and well-being. Please do not hesitate to call the office if you need care or have questions about your care.  In case of emergency, please visit the Emergency Department for urgent care, or contact our clinic at [phone] to schedule an appointment. We're here to help you!   Have a wonderful day and week. With Gratitude, Taiyana Kissler MSN, FNP-BC
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
## 2024-01-02 DIAGNOSIS — F439 Reaction to severe stress, unspecified: Secondary | ICD-10-CM | POA: Insufficient documentation

## 2024-01-02 NOTE — Assessment & Plan Note (Signed)
 Refill has been sent to the pharmacy. The patient is encouraged to continue the current treatment regimen as prescribed.

## 2024-01-02 NOTE — Assessment & Plan Note (Signed)
 Controlled Asymptomatic in the clinic Encouraged to continue take amlodipine  5 mg daily Low-sodium diet with increase physical activity encouraged BP Readings from Last 3 Encounters:  01/01/24 124/85  12/21/23 137/80  12/13/23 138/84
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
## 2024-01-09 LAB — CMP14+EGFR
ALT: 12 IU/L (ref 0–32)
AST: 15 IU/L (ref 0–40)
Albumin: 4.4 g/dL (ref 3.9–4.9)
Alkaline Phosphatase: 102 IU/L (ref 44–121)
BUN/Creatinine Ratio: 14 (ref 9–23)
BUN: 10 mg/dL (ref 6–20)
Bilirubin Total: 0.4 mg/dL (ref 0.0–1.2)
CO2: 18 mmol/L — ABNORMAL LOW (ref 20–29)
Calcium: 9.7 mg/dL (ref 8.7–10.2)
Chloride: 106 mmol/L (ref 96–106)
Creatinine, Ser: 0.74 mg/dL (ref 0.57–1.00)
Globulin, Total: 2.4 g/dL (ref 1.5–4.5)
Glucose: 111 mg/dL — ABNORMAL HIGH (ref 70–99)
Potassium: 4.5 mmol/L (ref 3.5–5.2)
Sodium: 141 mmol/L (ref 134–144)
Total Protein: 6.8 g/dL (ref 6.0–8.5)
eGFR: 109 mL/min/1.73 (ref >59)

## 2024-01-09 LAB — CBC WITH DIFFERENTIAL/PLATELET
Basophils Absolute: 0.1 x10E3/uL (ref 0.0–0.2)
Basos: 1 %
EOS (ABSOLUTE): 0.1 x10E3/uL (ref 0.0–0.4)
Eos: 2 %
Hematocrit: 39.6 % (ref 34.0–46.6)
Hemoglobin: 12.5 g/dL (ref 11.1–15.9)
Immature Grans (Abs): 0 x10E3/uL (ref 0.0–0.1)
Immature Granulocytes: 0 %
Lymphocytes Absolute: 2.2 x10E3/uL (ref 0.7–3.1)
Lymphs: 36 %
MCH: 27.6 pg (ref 26.6–33.0)
MCHC: 31.6 g/dL (ref 31.5–35.7)
MCV: 87 fL (ref 79–97)
Monocytes Absolute: 0.5 x10E3/uL (ref 0.1–0.9)
Monocytes: 9 %
Neutrophils Absolute: 3.2 x10E3/uL (ref 1.4–7.0)
Neutrophils: 52 %
Platelets: 395 x10E3/uL (ref 150–450)
RBC: 4.53 x10E6/uL (ref 3.77–5.28)
RDW: 13.2 % (ref 11.7–15.4)
WBC: 6.1 x10E3/uL (ref 3.4–10.8)

## 2024-01-09 LAB — LIPID PANEL
Chol/HDL Ratio: 2.6 ratio (ref 0.0–4.4)
Cholesterol, Total: 192 mg/dL (ref 100–199)
HDL: 73 mg/dL (ref >39)
LDL Chol Calc (NIH): 104 mg/dL — ABNORMAL HIGH (ref 0–99)
Triglycerides: 84 mg/dL (ref 0–149)
VLDL Cholesterol Cal: 15 mg/dL (ref 5–40)

## 2024-01-09 LAB — HEMOGLOBIN A1C
Est. average glucose Bld gHb Est-mCnc: 111 mg/dL
Hgb A1c MFr Bld: 5.5 % (ref 4.8–5.6)

## 2024-01-09 LAB — TSH+FREE T4
Free T4: 1.08 ng/dL (ref 0.82–1.77)
TSH: 2.68 u[IU]/mL (ref 0.450–4.500)

## 2024-01-09 LAB — VITAMIN D 25 HYDROXY (VIT D DEFICIENCY, FRACTURES): Vit D, 25-Hydroxy: 20.3 ng/mL — ABNORMAL LOW (ref 30.0–100.0)

## 2024-01-11 ENCOUNTER — Ambulatory Visit: Admitting: Obstetrics & Gynecology

## 2024-01-11 VITALS — BP 138/87 | HR 80 | Ht 64.0 in | Wt 182.0 lb

## 2024-01-11 DIAGNOSIS — N857 Hematometra: Secondary | ICD-10-CM

## 2024-01-11 DIAGNOSIS — R102 Pelvic and perineal pain: Secondary | ICD-10-CM | POA: Diagnosis not present

## 2024-01-11 DIAGNOSIS — N882 Stricture and stenosis of cervix uteri: Secondary | ICD-10-CM
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
## 2024-01-13 ENCOUNTER — Ambulatory Visit: Payer: Self-pay | Admitting: Family Medicine

## 2024-01-22 ENCOUNTER — Encounter: Payer: Self-pay | Admitting: Family Medicine

## 2024-01-22 NOTE — Telephone Encounter (Signed)
 Kindly adhere to OB recommendations. A combined oral contraceptive is also another option.

## 2024-02-11 ENCOUNTER — Other Ambulatory Visit: Payer: Self-pay | Admitting: Family Medicine

## 2024-02-11 DIAGNOSIS — I10 Essential (primary) hypertension: Secondary | ICD-10-CM

## 2024-02-20 ENCOUNTER — Encounter: Payer: Self-pay | Admitting: Obstetrics & Gynecology

## 2024-02-20 MED ORDER — KETOROLAC TROMETHAMINE 10 MG PO TABS: 10.0000 mg | ORAL_TABLET | Freq: Three times a day (TID) | ORAL | 0 refills | Status: AC | PRN

## 2024-02-21 ENCOUNTER — Ambulatory Visit: Admitting: Obstetrics & Gynecology

## 2024-02-21 ENCOUNTER — Encounter: Payer: Self-pay | Admitting: Obstetrics & Gynecology

## 2024-02-21 VITALS — BP 130/86 | HR 89 | Ht 64.0 in | Wt 174.0 lb

## 2024-02-21 DIAGNOSIS — N946 Dysmenorrhea, unspecified: Secondary | ICD-10-CM | POA: Diagnosis not present

## 2024-02-21 DIAGNOSIS — N882 Stricture and stenosis of cervix uteri: Secondary | ICD-10-CM

## 2024-02-21 MED ORDER — NORETHINDRONE ACETATE 5 MG PO TABS: 5.0000 mg | ORAL_TABLET | Freq: Every day | ORAL | 3 refills | Status: AC
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
## 2024-03-03 ENCOUNTER — Other Ambulatory Visit: Payer: Self-pay | Admitting: Family Medicine

## 2024-03-03 DIAGNOSIS — E559 Vitamin D deficiency, unspecified: Secondary | ICD-10-CM

## 2024-03-13 ENCOUNTER — Emergency Department (HOSPITAL_COMMUNITY)

## 2024-03-13 ENCOUNTER — Other Ambulatory Visit: Payer: Self-pay

## 2024-03-13 ENCOUNTER — Encounter (HOSPITAL_COMMUNITY): Payer: Self-pay

## 2024-03-13 ENCOUNTER — Emergency Department (HOSPITAL_COMMUNITY)
Admission: EM | Admit: 2024-03-13 | Discharge: 2024-03-13 | Disposition: A | Discharge status: Home / Self Care | Attending: Emergency Medicine | Admitting: Emergency Medicine

## 2024-03-13 DIAGNOSIS — Z87891 Personal history of nicotine dependence: Secondary | ICD-10-CM | POA: Insufficient documentation

## 2024-03-13 DIAGNOSIS — N882 Stricture and stenosis of cervix uteri: Secondary | ICD-10-CM | POA: Diagnosis not present

## 2024-03-13 DIAGNOSIS — Z79899 Other long term (current) drug therapy: Secondary | ICD-10-CM | POA: Insufficient documentation

## 2024-03-13 DIAGNOSIS — R1030 Lower abdominal pain, unspecified: Secondary | ICD-10-CM | POA: Diagnosis present

## 2024-03-13 DIAGNOSIS — I10 Essential (primary) hypertension: Secondary | ICD-10-CM | POA: Insufficient documentation

## 2024-03-13 HISTORY — DX: Dysmenorrhea, unspecified: N94.6

## 2024-03-13 LAB — CBC WITH DIFFERENTIAL/PLATELET
Abs Immature Granulocytes: 0.03 K/uL (ref 0.00–0.07)
Basophils Absolute: 0 K/uL (ref 0.0–0.1)
Basophils Relative: 1 %
Eosinophils Absolute: 0.1 K/uL (ref 0.0–0.5)
Eosinophils Relative: 1 %
HCT: 40.2 % (ref 36.0–46.0)
Hemoglobin: 13.3 g/dL (ref 12.0–15.0)
Immature Granulocytes: 0 %
Lymphocytes Relative: 23 %
Lymphs Abs: 2 K/uL (ref 0.7–4.0)
MCH: 27.9 pg (ref 26.0–34.0)
MCHC: 33.1 g/dL (ref 30.0–36.0)
MCV: 84.5 fL (ref 80.0–100.0)
Monocytes Absolute: 0.5 K/uL (ref 0.1–1.0)
Monocytes Relative: 6 %
Neutro Abs: 6 K/uL (ref 1.7–7.7)
Neutrophils Relative %: 69 %
Platelets: 415 K/uL — ABNORMAL HIGH (ref 150–400)
RBC: 4.76 MIL/uL (ref 3.87–5.11)
RDW: 13.2 % (ref 11.5–15.5)
WBC: 8.6 K/uL (ref 4.0–10.5)
nRBC: 0 % (ref 0.0–0.2)

## 2024-03-13 LAB — BASIC METABOLIC PANEL WITH GFR
Anion gap: 15 (ref 5–15)
BUN: 10 mg/dL (ref 6–20)
CO2: 18 mmol/L — ABNORMAL LOW (ref 22–32)
Calcium: 9.3 mg/dL (ref 8.9–10.3)
Chloride: 108 mmol/L (ref 98–111)
Creatinine, Ser: 0.71 mg/dL (ref 0.44–1.00)
GFR, Estimated: >60 mL/min (ref >60)
Glucose, Bld: 91 mg/dL (ref 70–99)
Potassium: 3.6 mmol/L (ref 3.5–5.1)
Sodium: 142 mmol/L (ref 135–145)

## 2024-03-13 LAB — URINALYSIS, W/ REFLEX TO CULTURE (INFECTION SUSPECTED)
Bacteria, UA: NONE SEEN
Bilirubin Urine: NEGATIVE
Glucose, UA: NEGATIVE mg/dL
Ketones, ur: 80 mg/dL — AB
Leukocytes,Ua: NEGATIVE
Nitrite: NEGATIVE
Protein, ur: 30 mg/dL — AB
Specific Gravity, Urine: 1.024 (ref 1.005–1.030)
pH: 7 (ref 5.0–8.0)

## 2024-03-13 LAB — HCG, SERUM, QUALITATIVE: Preg, Serum: NEGATIVE

## 2024-03-13 MED ORDER — ONDANSETRON HCL 4 MG/2ML IJ SOLN
4.0000 mg | Freq: Once | INTRAMUSCULAR | Status: AC
  Administered 2024-03-13: 4 mg via INTRAVENOUS
  Filled 2024-03-13: qty 2

## 2024-03-13 MED ORDER — KETOROLAC TROMETHAMINE 15 MG/ML IJ SOLN
15.0000 mg | Freq: Once | INTRAMUSCULAR | Status: AC
  Administered 2024-03-13: 15 mg via INTRAVENOUS
  Filled 2024-03-13: qty 1

## 2024-03-13 MED ORDER — HYDROMORPHONE HCL 1 MG/ML IJ SOLN
0.5000 mg | Freq: Once | INTRAMUSCULAR | Status: AC
  Administered 2024-03-13: 0.5 mg via INTRAVENOUS
  Filled 2024-03-13: qty 0.5

## 2024-03-13 MED ORDER — LACTATED RINGERS IV BOLUS
1000.0000 mL | Freq: Once | INTRAVENOUS | Status: AC
  Administered 2024-03-13: 1000 mL via INTRAVENOUS

## 2024-03-13 MED ORDER — OXYCODONE HCL 5 MG PO TABS: 5.0000 mg | ORAL_TABLET | Freq: Four times a day (QID) | ORAL | 0 refills | Status: AC | PRN

## 2024-03-13 MED ORDER — ONDANSETRON 4 MG PO TBDP: 4.0000 mg | ORAL_TABLET | Freq: Three times a day (TID) | ORAL | 0 refills | Status: AC | PRN

## 2024-03-13 NOTE — ED Triage Notes (Signed)
 Pt arrived via POV c/o lower abdominal pain for several months with heavy bleeding, and is concerned she has endometriosis. Pt reports she is waiting to have a hysterectomy scheduled.

## 2024-03-13 NOTE — Discharge Instructions (Addendum)
 We evaluated you for your pelvic pain.  Your ultrasound shows blood in your uterus.  This is likely due to your narrowing cervix.  We did not find any dangerous cause of your symptoms but this condition can be very painful. Please take Tylenol  (acetaminophen ) and Motrin  (ibuprofen ) for your symptoms at home.  You can take 1000 mg of Tylenol  every 6 hours and 600 mg of Motrin  every 6 hours as needed for your symptoms.  You can take these medicines together as needed, either at the same time, or alternating every 3 hours.  We have given you a small amount of oxycodone  which you can take for pain not relieved by Tylenol  and Motrin .  Do not drink alcohol, drive, or operate machinery when taking this medicine.  Please follow-up closely with your OB/GYN doctor.  We have sent a message to Dr. Jayne to help you get follow-up.  Please return if you have any new or worsening symptoms.
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
## 2024-03-21 ENCOUNTER — Telehealth: Payer: Self-pay | Admitting: Obstetrics & Gynecology

## 2024-03-21 ENCOUNTER — Other Ambulatory Visit: Payer: Self-pay | Admitting: Obstetrics & Gynecology

## 2024-03-21 DIAGNOSIS — N946 Dysmenorrhea, unspecified: Secondary | ICD-10-CM

## 2024-03-21 DIAGNOSIS — N857 Hematometra: Secondary | ICD-10-CM

## 2024-03-21 NOTE — Progress Notes (Signed)
 Order for surgical referral  Wrenn Willcox, DO Attending Obstetrician & Gynecologist, [facility] [employer], [facility]

## 2024-03-21 NOTE — Telephone Encounter (Signed)
 I called patient to give her Pre-Op and surgery information and she was very upset that she didn't get a call from you stating that she can't drop everything to have surgery next week that she needed time to set up child care and work things. Stated to cancel the surgery and that she would have to call back. So surgery has been cancelled.

## 2024-03-25 ENCOUNTER — Other Ambulatory Visit (HOSPITAL_COMMUNITY)

## 2024-03-27 ENCOUNTER — Ambulatory Visit (HOSPITAL_COMMUNITY): Admission: RE | Admit: 2024-03-27 | Source: Home / Self Care | Admitting: Obstetrics & Gynecology

## 2024-04-02 ENCOUNTER — Encounter: Admitting: Obstetrics & Gynecology

## 2024-04-14 ENCOUNTER — Encounter (HOSPITAL_COMMUNITY): Payer: Self-pay

## 2024-04-14 ENCOUNTER — Emergency Department (HOSPITAL_COMMUNITY)

## 2024-04-14 ENCOUNTER — Emergency Department (HOSPITAL_COMMUNITY): Admission: EM | Admit: 2024-04-14 | Discharge: 2024-04-14 | Disposition: A | Discharge status: Home / Self Care

## 2024-04-14 DIAGNOSIS — N857 Hematometra: Secondary | ICD-10-CM

## 2024-04-14 DIAGNOSIS — D75838 Other thrombocytosis: Secondary | ICD-10-CM | POA: Insufficient documentation

## 2024-04-14 DIAGNOSIS — N882 Stricture and stenosis of cervix uteri: Secondary | ICD-10-CM | POA: Diagnosis not present

## 2024-04-14 DIAGNOSIS — R109 Unspecified abdominal pain: Secondary | ICD-10-CM | POA: Diagnosis present

## 2024-04-14 LAB — COMPREHENSIVE METABOLIC PANEL WITH GFR
ALT: 17 U/L (ref 0–44)
AST: 22 U/L (ref 15–41)
Albumin: 4.6 g/dL (ref 3.5–5.0)
Alkaline Phosphatase: 80 U/L (ref 38–126)
Anion gap: 13 (ref 5–15)
BUN: 11 mg/dL (ref 6–20)
CO2: 21 mmol/L — ABNORMAL LOW (ref 22–32)
Calcium: 9.7 mg/dL (ref 8.9–10.3)
Chloride: 109 mmol/L (ref 98–111)
Creatinine, Ser: 0.77 mg/dL (ref 0.44–1.00)
GFR, Estimated: >60 mL/min (ref >60)
Glucose, Bld: 107 mg/dL — ABNORMAL HIGH (ref 70–99)
Potassium: 4 mmol/L (ref 3.5–5.1)
Sodium: 142 mmol/L (ref 135–145)
Total Bilirubin: 0.4 mg/dL (ref 0.0–1.2)
Total Protein: 7.7 g/dL (ref 6.5–8.1)

## 2024-04-14 LAB — CBC
HCT: 39.5 % (ref 36.0–46.0)
Hemoglobin: 12.8 g/dL (ref 12.0–15.0)
MCH: 27.4 pg (ref 26.0–34.0)
MCHC: 32.4 g/dL (ref 30.0–36.0)
MCV: 84.6 fL (ref 80.0–100.0)
Platelets: 401 K/uL — ABNORMAL HIGH (ref 150–400)
RBC: 4.67 MIL/uL (ref 3.87–5.11)
RDW: 13.4 % (ref 11.5–15.5)
WBC: 8.8 K/uL (ref 4.0–10.5)
nRBC: 0 % (ref 0.0–0.2)

## 2024-04-14 LAB — LIPASE, BLOOD: Lipase: 16 U/L (ref 11–51)

## 2024-04-14 LAB — URINALYSIS, ROUTINE W REFLEX MICROSCOPIC
Bilirubin Urine: NEGATIVE
Glucose, UA: NEGATIVE mg/dL
Ketones, ur: NEGATIVE mg/dL
Leukocytes,Ua: NEGATIVE
Nitrite: NEGATIVE
Protein, ur: NEGATIVE mg/dL
Specific Gravity, Urine: 1.021 (ref 1.005–1.030)
pH: 5 (ref 5.0–8.0)

## 2024-04-14 LAB — HCG, SERUM, QUALITATIVE: Preg, Serum: NEGATIVE

## 2024-04-14 MED ORDER — PROCHLORPERAZINE MALEATE 10 MG PO TABS
10.0000 mg | ORAL_TABLET | Freq: Once | ORAL | Status: AC
  Administered 2024-04-14: 10 mg via ORAL
  Filled 2024-04-14: qty 1

## 2024-04-14 MED ORDER — MORPHINE SULFATE (PF) 4 MG/ML IV SOLN
4.0000 mg | Freq: Once | INTRAVENOUS | Status: AC
  Administered 2024-04-14: 4 mg via INTRAVENOUS
  Filled 2024-04-14: qty 1

## 2024-04-14 MED ORDER — HYDROMORPHONE HCL 1 MG/ML IJ SOLN
1.0000 mg | Freq: Once | INTRAMUSCULAR | Status: AC
  Administered 2024-04-14: 1 mg via INTRAVENOUS
  Filled 2024-04-14: qty 1

## 2024-04-14 MED ORDER — METOCLOPRAMIDE HCL 5 MG/ML IJ SOLN
10.0000 mg | Freq: Once | INTRAMUSCULAR | Status: AC
  Administered 2024-04-14: 10 mg via INTRAVENOUS
  Filled 2024-04-14: qty 2

## 2024-04-14 MED ORDER — ONDANSETRON 4 MG PO TBDP: 4.0000 mg | ORAL_TABLET | Freq: Three times a day (TID) | ORAL | 0 refills | Status: AC | PRN

## 2024-04-14 MED ORDER — IBUPROFEN 600 MG PO TABS: 600.0000 mg | ORAL_TABLET | Freq: Four times a day (QID) | ORAL | 0 refills | Status: AC | PRN

## 2024-04-14 MED ORDER — PROMETHAZINE HCL 25 MG PO TABS: 25.0000 mg | ORAL_TABLET | Freq: Four times a day (QID) | ORAL | 0 refills | Status: AC | PRN

## 2024-04-14 MED ORDER — ONDANSETRON HCL 4 MG/2ML IJ SOLN
4.0000 mg | Freq: Once | INTRAMUSCULAR | Status: AC
  Administered 2024-04-14: 4 mg via INTRAVENOUS
  Filled 2024-04-14: qty 2

## 2024-04-14 MED ORDER — LORAZEPAM 2 MG/ML IJ SOLN
1.0000 mg | Freq: Once | INTRAMUSCULAR | Status: AC
  Administered 2024-04-14: 1 mg via INTRAVENOUS
  Filled 2024-04-14: qty 1

## 2024-04-14 NOTE — ED Provider Notes (Signed)
 [place] EMERGENCY DEPARTMENT AT [facility] Provider Note   CSN: [id number] Arrival date & time: 04/14/24  1506     Patient presents with: Abdominal Pain   Rebecca [name] is a 33 y.o. female presents today for abdominal cramping x 2 days.  Patient has been seen several times by OB/GYN and was initially scheduled to have a hysterectomy on December 19, however she switched OB/GYN's to get a second opinion.  Patient reports nausea and vomiting since 0300.  Patient denies fever, vaginal bleeding, discharge, diarrhea, chest pain, urinary symptoms or shortness of breath.  Patient reports this feels slightly different from her usual pain she feels due to her cervical stenosis.    Abdominal Pain Associated symptoms: nausea and vomiting        Prior to Admission medications   Medication Sig Start Date End Date Taking? Authorizing Provider  ibuprofen  (ADVIL ) 600 MG tablet Take 1 tablet (600 mg total) by mouth every 6 (six) hours as needed for moderate pain (pain score 4-6), mild pain (pain score 1-3) or cramping. 04/14/24  Yes Francis Ileana SAILOR, PA-C  ondansetron  (ZOFRAN -ODT) 4 MG disintegrating tablet Take 1 tablet (4 mg total) by mouth every 8 (eight) hours as needed for nausea or vomiting. 04/14/24  Yes Neve Branscomb N, PA-C  promethazine  (PHENERGAN ) 25 MG tablet Take 1 tablet (25 mg total) by mouth every 6 (six) hours as needed for nausea or vomiting. 04/14/24  Yes Yoland Scherr N, PA-C  albuterol  (VENTOLIN  HFA) 108 (90 Base) MCG/ACT inhaler Inhale 2 puffs into the lungs every 6 (six) hours as needed for wheezing or shortness of breath. 03/01/23   Leath-Warren, Etta PARAS, NP  amLODipine  (NORVASC ) 5 MG tablet TAKE ONE TABLET BY MOUTH DAILY 02/12/24   Bacchus, Gloria Z, FNP  azelastine  (ASTELIN ) 0.1 % nasal spray Place 1 spray into both nostrils 2 (two) times daily. Use in each nostril as directed 08/25/23   Iva Marty Saltness, MD  cetirizine  (ZYRTEC ) 10 MG tablet Take 1 tablet  (10 mg total) by mouth daily. 08/25/23   Iva Marty Saltness, MD  dicyclomine  (BENTYL ) 10 MG capsule Take 1 capsule (10 mg total) by mouth 4 (four) times daily -  before meals and at bedtime. As needed for abdominal pain 11/03/23   Ezzard Sonny GORMAN, PA-C  GAVILYTE-G 236 g solution SMARTSIG:Milliliter(s) By Mouth 11/06/23   [provider]  ibuprofen  (ADVIL ) 600 MG tablet Take 1 tablet (600 mg total) by mouth every 6 (six) hours as needed. 12/21/23   Ozan, Jennifer, DO  ketorolac  (TORADOL ) 10 MG tablet Take 1 tablet (10 mg total) by mouth every 8 (eight) hours as needed. 02/20/24   Jayne Vonn DEL, MD  medroxyPROGESTERone  (PROVERA ) 10 MG tablet Take 1 daily for 10 days Patient not taking: Reported on 02/21/2024 09/13/23   Signa Delon LABOR, NP  misoprostol  (CYTOTEC ) 200 MCG tablet Place 2 tablets in the vagina, the night before the appointment Patient not taking: Reported on 02/21/2024 12/21/23   Ozan, Jennifer, DO  montelukast  (SINGULAIR ) 10 MG tablet Take 1 tablet (10 mg total) by mouth at bedtime. 08/25/23   Iva Marty Saltness, MD  norethindrone  (AYGESTIN ) 5 MG tablet Take 1 tablet (5 mg total) by mouth daily. 02/21/24   Jayne Vonn DEL, MD  ondansetron  (ZOFRAN -ODT) 4 MG disintegrating tablet Take 1 tablet (4 mg total) by mouth every 8 (eight) hours as needed for nausea or vomiting. 03/13/24   Francesca Elsie CROME, MD  oxyCODONE  (ROXICODONE ) 5 MG  immediate release tablet Take 1 tablet (5 mg total) by mouth every 6 (six) hours as needed for severe pain (pain score 7-10). 03/13/24   Francesca Elsie CROME, MD  pantoprazole  (PROTONIX ) 40 MG tablet Take 1 tablet (40 mg total) by mouth daily. 08/25/23   Iva Marty Saltness, MD  Probiotic Product (PROBIOTIC PO) Take by mouth.    [provider]  SUMAtriptan  (IMITREX ) 25 MG tablet Take 1 tablet (25 mg total) by mouth every 2 (two) hours as needed for migraine. May repeat in 2 hours if headache persists or recurs. 01/01/24   Bacchus, Meade PEDLAR, FNP   VENTOLIN  HFA 108 (90 Base) MCG/ACT inhaler Inhale 2 puffs into the lungs every 4 (four) hours as needed for wheezing or shortness of breath. 07/21/23   Cheryl Reusing, FNP  Vitamin D , Ergocalciferol , (DRISDOL ) 1.25 MG (50000 UNIT) CAPS capsule Take 1 capsule (50,000 Units total) by mouth every 7 (seven) days. 03/04/24   Bacchus, Gloria Z, FNP    Allergies: Nyquil [pseudoeph-doxylamine -dm-apap] and Benadryl  itch stopping [diphenhydramine -zinc acetate]    Review of Systems  Gastrointestinal:  Positive for nausea and vomiting.  Genitourinary:  Positive for pelvic pain.    Updated Vital Signs BP (!) 157/89   Pulse 99   Temp 98.2 F (36.8 C) (Oral)   Resp 16   Ht 5' 4 (1.626 m)   Wt 79.4 kg   SpO2 94%   BMI 30.04 kg/m   Physical Exam Vitals and nursing note reviewed.  Constitutional:      General: She is not in acute distress.    Appearance: She is well-developed. She is not toxic-appearing.     Comments: Uncomfortable appearing  HENT:     Head: Normocephalic and atraumatic.  Eyes:     Extraocular Movements: Extraocular movements intact.     Conjunctiva/sclera: Conjunctivae normal.  Cardiovascular:     Rate and Rhythm: Normal rate and regular rhythm.     Heart sounds: Normal heart sounds. No murmur heard. Pulmonary:     Effort: Pulmonary effort is normal. No respiratory distress.     Breath sounds: Normal breath sounds.  Abdominal:     Palpations: Abdomen is soft.     Tenderness: There is abdominal tenderness in the suprapubic area. Negative signs include Murphy's sign, Rovsing's sign and McBurney's sign.  Musculoskeletal:        General: No swelling.     Cervical back: Neck supple.  Skin:    General: Skin is warm and dry.     Capillary Refill: Capillary refill takes less than 2 seconds.  Neurological:     General: No focal deficit present.     Mental Status: She is alert and oriented to person, place, and time.  Psychiatric:        Mood and Affect: Mood normal.      (all labs ordered are listed, but only abnormal results are displayed) Labs Reviewed  COMPREHENSIVE METABOLIC PANEL WITH GFR - Abnormal; Notable for the following components:      Result Value   CO2 21 (*)    Glucose, Bld 107 (*)    All other components within normal limits  CBC - Abnormal; Notable for the following components:   Platelets 401 (*)    All other components within normal limits  URINALYSIS, ROUTINE W REFLEX MICROSCOPIC - Abnormal; Notable for the following components:   APPearance HAZY (*)    Hgb urine dipstick SMALL (*)    Bacteria, UA RARE (*)  All other components within normal limits  LIPASE, BLOOD  HCG, SERUM, QUALITATIVE    EKG: None  Radiology: US  Pelvis Complete Result Date: 04/14/2024 CLINICAL DATA:  Pelvic pain EXAM: TRANSABDOMINAL AND TRANSVAGINAL ULTRASOUND OF PELVIS DOPPLER ULTRASOUND OF OVARIES TECHNIQUE: Both transabdominal and transvaginal ultrasound examinations of the pelvis were performed. Transabdominal technique was performed for global imaging of the pelvis including uterus, ovaries, adnexal regions, and pelvic cul-de-sac. It was necessary to proceed with endovaginal exam following the transabdominal exam to visualize the pelvic structures. Color and duplex Doppler ultrasound was utilized to evaluate blood flow to the ovaries. COMPARISON:  Ultrasound pelvis dated 03/13/2024 FINDINGS: Uterus Measurements: 10.2 cm in sagittal dimension. No fibroids or other mass visualized. Endometrium Thickness: 2.3 cm at the fundus. Again seen is fluid distention of the endometrial canal, now seen only in the lower uterine segment measuring up to 4.5 cm in diameter, previously 4.0 cm. Right ovary Measurements: 2.4 x 1.8 x 1.5 cm = volume: 3.5 mL. Normal appearance. No adnexal mass. Left ovary Measurements: 2.7 x 1.8 x 1.7 cm = volume: 4.3 mL. Normal appearance. No adnexal mass. Pulsed Doppler evaluation of both ovaries demonstrates normal low-resistance arterial  and venous waveforms. The left ovarian tracing is subtly seen. Other findings No abnormal free fluid. IMPRESSION: 1. No definite evidence of ovarian torsion. 2. Persistent fluid distention of the endometrial canal, in keeping with hematometra, now seen only in the lower uterine segment measuring up to 4.5 cm in diameter, previously 4.0 cm. Findings are suspicious for cervical stenosis. Electronically Signed   By: Limin  Xu M.D.   On: 04/14/2024 18:38   US  Transvaginal Non-OB Result Date: 04/14/2024 CLINICAL DATA:  Pelvic pain EXAM: TRANSABDOMINAL AND TRANSVAGINAL ULTRASOUND OF PELVIS DOPPLER ULTRASOUND OF OVARIES TECHNIQUE: Both transabdominal and transvaginal ultrasound examinations of the pelvis were performed. Transabdominal technique was performed for global imaging of the pelvis including uterus, ovaries, adnexal regions, and pelvic cul-de-sac. It was necessary to proceed with endovaginal exam following the transabdominal exam to visualize the pelvic structures. Color and duplex Doppler ultrasound was utilized to evaluate blood flow to the ovaries. COMPARISON:  Ultrasound pelvis dated 03/13/2024 FINDINGS: Uterus Measurements: 10.2 cm in sagittal dimension. No fibroids or other mass visualized. Endometrium Thickness: 2.3 cm at the fundus. Again seen is fluid distention of the endometrial canal, now seen only in the lower uterine segment measuring up to 4.5 cm in diameter, previously 4.0 cm. Right ovary Measurements: 2.4 x 1.8 x 1.5 cm = volume: 3.5 mL. Normal appearance. No adnexal mass. Left ovary Measurements: 2.7 x 1.8 x 1.7 cm = volume: 4.3 mL. Normal appearance. No adnexal mass. Pulsed Doppler evaluation of both ovaries demonstrates normal low-resistance arterial and venous waveforms. The left ovarian tracing is subtly seen. Other findings No abnormal free fluid. IMPRESSION: 1. No definite evidence of ovarian torsion. 2. Persistent fluid distention of the endometrial canal, in keeping with hematometra,  now seen only in the lower uterine segment measuring up to 4.5 cm in diameter, previously 4.0 cm. Findings are suspicious for cervical stenosis. Electronically Signed   By: Limin  Xu M.D.   On: 04/14/2024 18:38   US  Art/Ven Flow Abd Pelv Doppler Result Date: 04/14/2024 CLINICAL DATA:  Pelvic pain EXAM: TRANSABDOMINAL AND TRANSVAGINAL ULTRASOUND OF PELVIS DOPPLER ULTRASOUND OF OVARIES TECHNIQUE: Both transabdominal and transvaginal ultrasound examinations of the pelvis were performed. Transabdominal technique was performed for global imaging of the pelvis including uterus, ovaries, adnexal regions, and pelvic cul-de-sac. It was necessary to proceed  with endovaginal exam following the transabdominal exam to visualize the pelvic structures. Color and duplex Doppler ultrasound was utilized to evaluate blood flow to the ovaries. COMPARISON:  Ultrasound pelvis dated 03/13/2024 FINDINGS: Uterus Measurements: 10.2 cm in sagittal dimension. No fibroids or other mass visualized. Endometrium Thickness: 2.3 cm at the fundus. Again seen is fluid distention of the endometrial canal, now seen only in the lower uterine segment measuring up to 4.5 cm in diameter, previously 4.0 cm. Right ovary Measurements: 2.4 x 1.8 x 1.5 cm = volume: 3.5 mL. Normal appearance. No adnexal mass. Left ovary Measurements: 2.7 x 1.8 x 1.7 cm = volume: 4.3 mL. Normal appearance. No adnexal mass. Pulsed Doppler evaluation of both ovaries demonstrates normal low-resistance arterial and venous waveforms. The left ovarian tracing is subtly seen. Other findings No abnormal free fluid. IMPRESSION: 1. No definite evidence of ovarian torsion. 2. Persistent fluid distention of the endometrial canal, in keeping with hematometra, now seen only in the lower uterine segment measuring up to 4.5 cm in diameter, previously 4.0 cm. Findings are suspicious for cervical stenosis. Electronically Signed   By: Limin  Xu M.D.   On: 04/14/2024 18:38     Procedures    Medications Ordered in the ED  morphine  (PF) 4 MG/ML injection 4 mg (4 mg Intravenous Given 04/14/24 1646)  ondansetron  (ZOFRAN ) injection 4 mg (4 mg Intravenous Given 04/14/24 1645)  LORazepam (ATIVAN) injection 1 mg (1 mg Intravenous Given 04/14/24 1733)  HYDROmorphone  (DILAUDID ) injection 1 mg (1 mg Intravenous Given 04/14/24 1734)  ondansetron  (ZOFRAN ) injection 4 mg (4 mg Intravenous Given 04/14/24 1732)  metoCLOPramide (REGLAN) injection 10 mg (10 mg Intravenous Given 04/14/24 1901)                                    Medical Decision Making Amount and/or Complexity of Data Reviewed Labs: ordered. Radiology: ordered.  Risk Prescription drug management.   This patient presents to the ED for concern of suprapubic pain differential diagnosis includes tubo-ovarian abscess, endometriosis, ovarian torsion, diverticulitis, UTI, pyelonephritis,  Additional history obtained   Additional history obtained from Electronic Medical Record External records from outside source obtained and reviewed including OB/GYN note   Lab Tests:  I Ordered, and personally interpreted labs.  The pertinent results include: Marginally elevated platelets of 401, mildly decreased CO2 at 21, UA with small hemoglobin, rare bacteria, 6-10 squamous epithelial, hCG negative   Imaging Studies ordered:  I ordered imaging studies including pelvic ultrasound with Doppler I independently visualized and interpreted imaging which showed no definite evidence of ovarian torsion.  Persistent fluid distention of the endometrial canal, in keeping with hematometra, now seen only in the lower uterine segment measuring up to 4.5 cm in diameter, previously 4 cm.  Findings suspicious for cervical stenosis. I agree with the radiologist interpretation   Medicines ordered and prescription drug management:  I ordered medication including morphine  and zofran    I have reviewed the patients home medicines and have made  adjustments as needed   Problem List / ED Course:  Considered for admission or further workup however patient's vital signs, physical exam, labs, and imaging are reassuring.  Patient given analgesics and antiemetics outpatient.  Patient to follow-up with OB/GYN for further evaluation and workup.  Patient given return precautions.  I feel patient safe for discharge at this time.     Final diagnoses:  Cervical stenosis (uterine cervix)  Hematometra  ED Discharge Orders          Ordered    ondansetron  (ZOFRAN -ODT) 4 MG disintegrating tablet  Every 8 hours PRN        04/14/24 1902    promethazine  (PHENERGAN ) 25 MG tablet  Every 6 hours PRN        04/14/24 1902    ibuprofen  (ADVIL ) 600 MG tablet  Every 6 hours PRN        04/14/24 1902               Francis Ileana SAILOR, PA-C 04/14/24 1932    Neysa Caron PARAS, DO 04/14/24 2345

## 2024-04-14 NOTE — Discharge Instructions (Addendum)
 Today you were seen for abdominal pain, I suspect this is likely due to your cervical stenosis and hematometra.  You have been prescribed ibuprofen  for pain and Zofran  for nausea/vomiting.  You have also been prescribed Phenergan  for refractory nausea/vomiting.  Please follow-up with OB/GYN as soon as possible for further evaluation and workup.  Thank you for letting us  treat you today. After reviewing your labs and imaging, I feel you are safe to go home. Please follow up with your PCP in the next several days and provide them with your records from this visit. Return to the Emergency Room if pain becomes severe or symptoms worsen.

## 2024-04-14 NOTE — ED Notes (Signed)
 Nurse in to discharge pt, pt actively vomiting, stated the pain meds are making her feel sick. PA notified, new orders placed.

## 2024-04-14 NOTE — ED Triage Notes (Signed)
 Abdominal cramping for the past two days. Pt recently diagnosed with endometriosis. Pt having n/v since 0300. Severe cramping on and off.

## 2024-04-18 ENCOUNTER — Telehealth: Payer: Self-pay

## 2024-04-18 NOTE — Telephone Encounter (Signed)
 [email or web address]

## 2024-04-18 NOTE — Telephone Encounter (Signed)
 Copied from CRM #[id number]. Topic: Clinical - Medication Question >> Apr 18, 2024 10:59 AM Tonda B wrote: Reason for CRM: patient is calling has questions about birth control please call pt [phone] (M)

## 2024-05-03 ENCOUNTER — Encounter (HOSPITAL_COMMUNITY): Admission: RE | Payer: Self-pay | Source: Home / Self Care

## 2024-05-03 SURGERY — HYSTERECTOMY, TOTAL, LAPAROSCOPIC, ROBOT-ASSISTED WITH SALPINGECTOMY
Anesthesia: General | Laterality: Bilateral

## 2024-06-03 ENCOUNTER — Telehealth: Admitting: Family Medicine

## 2024-06-03 ENCOUNTER — Encounter: Payer: Self-pay | Admitting: Family Medicine

## 2024-06-03 DIAGNOSIS — E038 Other specified hypothyroidism: Secondary | ICD-10-CM | POA: Diagnosis not present

## 2024-06-03 DIAGNOSIS — E559 Vitamin D deficiency, unspecified: Secondary | ICD-10-CM | POA: Diagnosis not present

## 2024-06-03 DIAGNOSIS — I10 Essential (primary) hypertension: Secondary | ICD-10-CM | POA: Diagnosis not present

## 2024-06-03 DIAGNOSIS — R7301 Impaired fasting glucose: Secondary | ICD-10-CM

## 2024-06-03 DIAGNOSIS — E7849 Other hyperlipidemia: Secondary | ICD-10-CM | POA: Diagnosis not present

## 2024-06-03 NOTE — Assessment & Plan Note (Signed)
 Controlled Blood Pressure  The patients blood pressure is controlled in the clinic today. Encouraged to continue current treatment regimen The patient remains asymptomatic. Advised to maintain a low-sodium diet and increase physical activity as tolerated.

## 2024-06-03 NOTE — Telephone Encounter (Signed)
 Please inform the patient: Vertigo is primarily diagnosed through a careful history and physical exam, not a blood test. A key feature of BPPV is brief dizziness or a spinning sensation triggered by position changes, such as lying back, bending over, or turning the head, which matches what youre noticing.

## 2024-06-03 NOTE — Progress Notes (Signed)
 "  Virtual Visit via Video Note  I connected with Rebecca [name] on 06/03/24 at  9:40 AM EST by a video enabled telemedicine application and verified that I am speaking with the correct person using two identifiers.  Patient Location: Home Provider Location: Home Office  I discussed the limitations, risks, security, and privacy concerns of performing an evaluation and management service by video and the availability of in person appointments. I also discussed with the patient that there may be a patient responsible charge related to this service. The patient expressed understanding and agreed to proceed.  Subjective: PCP: Edman Meade PEDLAR, FNP  Chief Complaint  Patient presents with   Medical Management of Chronic Issues    Five month follow up    HPI The patient presents today for management of chronic conditions and reports no complaints or concerns at this time. She reports adherence to her treatment regimen with no side effects or adverse effects from her medications   ROS: Per HPI Current Medications[1]  Observations/Objective: There were no vitals filed for this visit. Physical Exam Patient is well-developed, well-nourished in no acute distress.  Resting comfortably at home.  Head is normocephalic, atraumatic.  No labored breathing.  Speech is clear and coherent with logical content.  Patient is alert and oriented at baseline.   Assessment and Plan: Primary hypertension Assessment & Plan: Controlled Blood Pressure  The patients blood pressure is controlled in the clinic today. Encouraged to continue current treatment regimen The patient remains asymptomatic. Advised to maintain a low-sodium diet and increase physical activity as tolerated.     IFG (impaired fasting glucose) -     Hemoglobin A1c  Vitamin D  deficiency -     VITAMIN D  25 Hydroxy (Vit-D Deficiency, Fractures)  TSH (thyroid-stimulating hormone deficiency) -     TSH + free T4  Other  hyperlipidemia -     Lipid panel -     CMP14+EGFR -     CBC with Differential/Platelet  Encouraged the patient to continue her treatment regimen as prescribed. A heart-healthy diet was encouraged, along with increased physical activity as tolerated.   Follow Up Instructions: Return in about 5 months (around 11/01/2024).   I discussed the assessment and treatment plan with the patient. The patient was provided an opportunity to ask questions, and all were answered. The patient agreed with the plan and demonstrated an understanding of the instructions.   The patient was advised to call back or seek an in-person evaluation if the symptoms worsen or if the condition fails to improve as anticipated.  The above assessment and management plan was discussed with the patient. The patient verbalized understanding of and has agreed to the management plan.   Kemari Mares  Z Bacchus, FNP    [1]  Current Outpatient Medications:    albuterol  (VENTOLIN  HFA) 108 (90 Base) MCG/ACT inhaler, Inhale 2 puffs into the lungs every 6 (six) hours as needed for wheezing or shortness of breath., Disp: 8 g, Rfl: 0   amLODipine  (NORVASC ) 5 MG tablet, TAKE ONE TABLET BY MOUTH DAILY, Disp: 30 tablet, Rfl: 1   azelastine  (ASTELIN ) 0.1 % nasal spray, Place 1 spray into both nostrils 2 (two) times daily. Use in each nostril as directed, Disp: 30 mL, Rfl: 5   cetirizine  (ZYRTEC ) 10 MG tablet, Take 1 tablet (10 mg total) by mouth daily., Disp: 90 tablet, Rfl: 1   dicyclomine  (BENTYL ) 10 MG capsule, Take 1 capsule (10 mg total) by mouth 4 (four) times  daily -  before meals and at bedtime. As needed for abdominal pain, Disp: 90 capsule, Rfl: 0   GAVILYTE-G 236 g solution, SMARTSIG:Milliliter(s) By Mouth, Disp: , Rfl:    ibuprofen  (ADVIL ) 600 MG tablet, Take 1 tablet (600 mg total) by mouth every 6 (six) hours as needed., Disp: 30 tablet, Rfl: 1   ibuprofen  (ADVIL ) 600 MG tablet, Take 1 tablet (600 mg total) by mouth every 6 (six)  hours as needed for moderate pain (pain score 4-6), mild pain (pain score 1-3) or cramping., Disp: 30 tablet, Rfl: 0   ketorolac  (TORADOL ) 10 MG tablet, Take 1 tablet (10 mg total) by mouth every 8 (eight) hours as needed., Disp: 15 tablet, Rfl: 0   medroxyPROGESTERone  (PROVERA ) 10 MG tablet, Take 1 daily for 10 days (Patient not taking: Reported on 02/21/2024), Disp: 10 tablet, Rfl: 0   misoprostol  (CYTOTEC ) 200 MCG tablet, Place 2 tablets in the vagina, the night before the appointment (Patient not taking: Reported on 02/21/2024), Disp: 2 tablet, Rfl: 0   montelukast  (SINGULAIR ) 10 MG tablet, Take 1 tablet (10 mg total) by mouth at bedtime., Disp: 90 tablet, Rfl: 1   norethindrone  (AYGESTIN ) 5 MG tablet, Take 1 tablet (5 mg total) by mouth daily., Disp: 30 tablet, Rfl: 3   ondansetron  (ZOFRAN -ODT) 4 MG disintegrating tablet, Take 1 tablet (4 mg total) by mouth every 8 (eight) hours as needed for nausea or vomiting., Disp: 20 tablet, Rfl: 0   ondansetron  (ZOFRAN -ODT) 4 MG disintegrating tablet, Take 1 tablet (4 mg total) by mouth every 8 (eight) hours as needed for nausea or vomiting., Disp: 20 tablet, Rfl: 0   oxyCODONE  (ROXICODONE ) 5 MG immediate release tablet, Take 1 tablet (5 mg total) by mouth every 6 (six) hours as needed for severe pain (pain score 7-10)., Disp: 12 tablet, Rfl: 0   pantoprazole  (PROTONIX ) 40 MG tablet, Take 1 tablet (40 mg total) by mouth daily., Disp: 90 tablet, Rfl: 1   Probiotic Product (PROBIOTIC PO), Take by mouth., Disp: , Rfl:    promethazine  (PHENERGAN ) 25 MG tablet, Take 1 tablet (25 mg total) by mouth every 6 (six) hours as needed for nausea or vomiting., Disp: 30 tablet, Rfl: 0   SUMAtriptan  (IMITREX ) 25 MG tablet, Take 1 tablet (25 mg total) by mouth every 2 (two) hours as needed for migraine. May repeat in 2 hours if headache persists or recurs., Disp: 10 tablet, Rfl: 0   VENTOLIN  HFA 108 (90 Base) MCG/ACT inhaler, Inhale 2 puffs into the lungs every 4 (four) hours  as needed for wheezing or shortness of breath., Disp: 1 each, Rfl: 1   Vitamin D , Ergocalciferol , (DRISDOL ) 1.25 MG (50000 UNIT) CAPS capsule, Take 1 capsule (50,000 Units total) by mouth every 7 (seven) days., Disp: 20 capsule, Rfl: 1  "

## 2024-06-12 NOTE — Telephone Encounter (Signed)
 Kindly place a referral to ENT for further evaluation and collaborative care.

## 2024-06-13 ENCOUNTER — Other Ambulatory Visit: Payer: Self-pay

## 2024-06-13 DIAGNOSIS — R42 Dizziness and giddiness: Secondary | ICD-10-CM

## 2024-06-13 NOTE — Telephone Encounter (Signed)
 Referral placed

## 2024-06-28 ENCOUNTER — Ambulatory Visit: Admitting: Allergy & Immunology

## 2024-07-17 ENCOUNTER — Ambulatory Visit: Admitting: Family Medicine

## 2024-11-04 ENCOUNTER — Ambulatory Visit: Payer: Self-pay
